# Patient Record
Sex: Male | Born: 1950
Health system: Southern US, Community
[De-identification: ages and names within clinical notes are randomized; demographics above are authoritative.]

## PROBLEM LIST (undated history)

## (undated) DIAGNOSIS — I340 Nonrheumatic mitral (valve) insufficiency: Secondary | ICD-10-CM

## (undated) DIAGNOSIS — I2510 Atherosclerotic heart disease of native coronary artery without angina pectoris: Secondary | ICD-10-CM

## (undated) DIAGNOSIS — E785 Hyperlipidemia, unspecified: Secondary | ICD-10-CM

## (undated) DIAGNOSIS — I351 Nonrheumatic aortic (valve) insufficiency: Secondary | ICD-10-CM

## (undated) DIAGNOSIS — Q600 Renal agenesis, unilateral: Secondary | ICD-10-CM

## (undated) DIAGNOSIS — I10 Essential (primary) hypertension: Secondary | ICD-10-CM

## (undated) HISTORY — DX: Nonrheumatic mitral (valve) insufficiency: I34.0

## (undated) HISTORY — DX: Nonrheumatic aortic (valve) insufficiency: I35.1

## (undated) HISTORY — DX: Essential (primary) hypertension: I10

## (undated) HISTORY — DX: Hyperlipidemia, unspecified: E78.5

---

## 2002-03-25 HISTORY — PX: ANTERIOR CRUCIATE LIGAMENT REPAIR: SHX115

## 2004-12-20 ENCOUNTER — Ambulatory Visit: Payer: Self-pay | Admitting: Cardiology

## 2006-01-14 ENCOUNTER — Ambulatory Visit: Payer: Self-pay | Admitting: Cardiology

## 2008-12-17 LAB — HM COLONOSCOPY: HM Colonoscopy: NORMAL

## 2009-05-11 DIAGNOSIS — E785 Hyperlipidemia, unspecified: Secondary | ICD-10-CM | POA: Insufficient documentation

## 2009-05-11 DIAGNOSIS — I10 Essential (primary) hypertension: Secondary | ICD-10-CM | POA: Insufficient documentation

## 2009-05-11 DIAGNOSIS — E782 Mixed hyperlipidemia: Secondary | ICD-10-CM | POA: Insufficient documentation

## 2009-05-16 ENCOUNTER — Ambulatory Visit: Payer: Self-pay | Admitting: Cardiology

## 2009-05-16 DIAGNOSIS — R9431 Abnormal electrocardiogram [ECG] [EKG]: Secondary | ICD-10-CM | POA: Insufficient documentation

## 2010-04-24 NOTE — Assessment & Plan Note (Signed)
Summary: f3y/chest pain/eval for stress test   Visit Type:  3 yr f/u Referring Provider:  Dr. Lin Givens Primary Provider:  Dr. Lin Givens  CC:  chest pressure at times...Marland KitchenMarland Kitchenbp elevated.  History of Present Illness: Mr Careaga comes in today for evaluation and management of an abnormal EKG.  I last evaluated him in 2007. I was attempting to treat his hypertension and hyperlipidemia though his compliance was not very good.  He saw Dr. Lin Givens the other day and was placed on lamina propria. He has not returned for his blood work or for followup. I have encouraged him to do so.  He does not restrict his sodium in his diet. He does not exercise on a regular basis. He does not smoke.  She also told me had an abnormal EKG. He always has had a superior rightward axis with very low voltage in aVL. His EKG today is unchanged since 1997. Currently is having no angina or ischemic symptoms. He denies dyspnea on exertion. He's had no palpitations or syncope.  Current Medications (verified): 1)  Ramipril 10 Mg Caps (Ramipril) .[name].. 1 Cap Once Daily  Allergies (verified): No Known Drug Allergies  Review of Systems       negative other than history of present illness  Vital Signs:  Patient profile:   60 year old male Height:      66 inches Weight:      176 pounds BMI:     28.51 Pulse rate:   77 / minute Pulse rhythm:   irregular BP sitting:   156 / 100  (left arm) Cuff size:   large  Vitals Entered By: Danielle Rankin, CMA (May 16, 2009 4:04 PM)  Physical Exam  General:  Well developed, well nourished, in no acute distress. Head:  normocephalic and atraumatic Eyes:  PERRLA/EOM intact; conjunctiva and lids normal. Neck:  Neck supple, no JVD. No masses, thyromegaly or abnormal cervical nodes. Chest Tram Wrenn:  no deformities or breast masses noted Lungs:  Clear bilaterally to auscultation and percussion. Heart:  nondisplaced PMI, normal S1-S2, S2 splits.no obvious murmur, no right ventricular  lift Msk:  Back normal, normal gait. Muscle strength and tone normal. Pulses:  pulses normal in all 4 extremities Extremities:  No clubbing or cyanosis. Neurologic:  Alert and oriented x 3. Skin:  Intact without lesions or rashes. Psych:  Normal affect.   Problems:  Medical Problems Added: 1)  Dx of Abnormal Ekg  (ICD-794.31)  Impression & Recommendations:  Problem # 1:  ABNORMAL EKG (ICD-794.31) Assessment Unchanged his That EKG is stable since 1997. He's having no symptoms of organic heart disease. His blood pressures needs to be well controlled. Approximate 2 or 3 different medications to control. I've encouraged him to go back to get his blood work and follow Dr. Lin Givens. I do not feel stress test is indicated at this point. His updated medication list for this problem includes:    Ramipril 10 Mg Caps (Ramipril) .[name]... 1 cap once daily  Problem # 2:  HYPERLIPIDEMIA-MIXED (ICD-272.4) Assessment: Unchanged  Problem # 3:  HYPERTENSION (ICD-401.9) Assessment: Deteriorated His grandmother is maxed out. He is due blood work to Dr. Lin Givens. I would recommend a consideration for a calcium channel blocker next. I doubt that a beta blocker would be effective and probably poorly tolerated. I did not add any medication today. He promises to followup with Dr. Lin Givens. His updated medication list for this problem includes:    Ramipril 10 Mg Caps (Ramipril) .[name]... 1 cap once  daily  Orders: EKG w/ Interpretation (93000)  Patient Instructions: 1)  Your physician recommends that you schedule a follow-up appointment in: YEARLY  WITH DR Letica Giaimo 2)  Your physician recommends that you continue on your current medications as directed. Please refer to the Current Medication list given to you today.

## 2010-06-15 ENCOUNTER — Telehealth: Payer: Self-pay | Admitting: Cardiology

## 2010-06-15 NOTE — Telephone Encounter (Signed)
LOV faxed to [facility] @ [phone] 06/15/10/KM

## 2010-08-10 NOTE — Assessment & Plan Note (Signed)
[facility] OFFICE NOTE   KENDRYCK, LACROIX                  MRN:          [id number]  DATE:01/14/2006                            DOB:          August 23, 1950    Mr. Begue returns today for further management of his hypertension and mixed  hyperlipidemia.   He has had a cold and a viral syndrome form his office.  He ran out of his  Archer Asa about a month ago.   He is having no symptoms of angina or ischemia.   His lipids last year showed a total cholesterol 195, triglycerides of 173,  HDL was down to 34.2, LDL 034.  I recommended Crestor 10 mg a day because of  his very positive family history, hypertension, male sex, and age.  He  decided to take CholestOff over-the-counter q. week and also red yeast rice  twice a day.   His blood pressure medicine is Mavik 2 mg a day.   His blood pressure today is 150/78 and it was checked about 20 minutes later  and was 152/80.  His pulse is 89 and irregular.  He is in sinus rhythm with  RSR prime in V1 and V2, which is increased since last visit.  HEENT:  Normocephalic, atraumatic.  PERRLA.  Extraocular muscles are intact.  Somewhat bearded.  Carotid upstrokes are equal bilaterally without bruits.  There is no JVD.  Thyroid is not enlarged.  Trachea is midline.  LUNGS:  Reveal inspiratory and expiratory wheezing and rhonchi.  There are  no rales.  There is no dullness to percussion.  HEART:  Regular rate and rhythm.  ABDOMEN:  Soft with good bowel sounds.  There is no midline bruit.  There is  no hepatomegaly.  EXTREMITIES:  No edema.  Pulses intact.   ASSESSMENT AND PLAN:  1. Hypertension under suboptimal control.  Some of this is compliance      issue.  Last year he was 126/82 when he was taking his Mavik.  2. Mixed hyperlipidemia.  He has preferred to take over-the-counter      medicines, which I have told him will not be that effective.  He still      wants  to stay on these.   PLAN:  1. Check comprehensive metabolic panel and fasting lipids.  2. Mavik renewed for a year.  3. Follow up with me an a year.      Thomas C. Daleen Squibb, MD, [facility]     TCW/MedQ  DD:  01/14/2006  DT:  01/15/2006  Job #:  [id number]

## 2010-09-06 ENCOUNTER — Encounter: Payer: Self-pay | Admitting: Cardiovascular Disease

## 2014-01-11 LAB — CBC AND DIFFERENTIAL
HEMOGLOBIN: 15.8 g/dL (ref 13.5–17.5)
PLATELETS: 249 10*3/uL (ref 150–399)

## 2014-01-11 LAB — PSA: PSA: 0.31

## 2014-01-11 LAB — LIPID PANEL
Cholesterol: 179 mg/dL (ref 0–200)
HDL: 37 mg/dL (ref 35–70)
LDL Cholesterol: 111 mg/dL
Triglycerides: 158 mg/dL (ref 40–160)

## 2014-06-12 LAB — CBC AND DIFFERENTIAL: Hemoglobin: 15.8 g/dL (ref 13.5–17.5)

## 2014-06-12 LAB — HEMOGLOBIN A1C: HEMOGLOBIN A1C: 5.5 % (ref 4.0–6.0)

## 2014-06-16 LAB — BASIC METABOLIC PANEL
BUN: 23 mg/dL — AB (ref 4–21)
CREATININE: 0.9 mg/dL (ref <=1.3)
Glucose: 107 mg/dL
SODIUM: 138 mmol/L (ref 137–147)

## 2014-06-16 LAB — CBC AND DIFFERENTIAL
Platelets: 249 10*3/uL (ref 150–399)
WBC: 7.6 10^3/mL

## 2014-06-16 LAB — PSA: PSA: 0.31

## 2014-06-22 DIAGNOSIS — Z7189 Other specified counseling: Secondary | ICD-10-CM | POA: Insufficient documentation

## 2014-06-22 DIAGNOSIS — Z7185 Encounter for immunization safety counseling: Secondary | ICD-10-CM | POA: Insufficient documentation

## 2014-12-14 ENCOUNTER — Encounter: Payer: Self-pay | Admitting: Internal Medicine

## 2014-12-14 ENCOUNTER — Ambulatory Visit (INDEPENDENT_AMBULATORY_CARE_PROVIDER_SITE_OTHER): Payer: BLUE CROSS/BLUE SHIELD | Admitting: Internal Medicine

## 2014-12-14 VITALS — BP 164/90 | HR 85 | Temp 98.5°F | Resp 14 | Ht 65.5 in | Wt 183.0 lb

## 2014-12-14 DIAGNOSIS — E785 Hyperlipidemia, unspecified: Secondary | ICD-10-CM

## 2014-12-14 DIAGNOSIS — M5090 Cervical disc disorder, unspecified, unspecified cervical region: Secondary | ICD-10-CM | POA: Insufficient documentation

## 2014-12-14 DIAGNOSIS — Z23 Encounter for immunization: Secondary | ICD-10-CM | POA: Diagnosis not present

## 2014-12-14 DIAGNOSIS — I10 Essential (primary) hypertension: Secondary | ICD-10-CM | POA: Diagnosis not present

## 2014-12-14 LAB — COMPREHENSIVE METABOLIC PANEL
ALT: 22 U/L (ref 0–53)
AST: 15 U/L (ref 0–37)
Albumin: 4.7 g/dL (ref 3.5–5.2)
Alkaline Phosphatase: 63 U/L (ref 39–117)
BUN: 19 mg/dL (ref 6–23)
CHLORIDE: 101 meq/L (ref 96–112)
CO2: 29 meq/L (ref 19–32)
Calcium: 9.6 mg/dL (ref 8.4–10.5)
Creatinine, Ser: 1.07 mg/dL (ref 0.40–1.50)
GFR: 74.28 mL/min (ref >60.00)
GLUCOSE: 101 mg/dL — AB (ref 70–99)
POTASSIUM: 4.3 meq/L (ref 3.5–5.1)
Sodium: 136 mEq/L (ref 135–145)
Total Bilirubin: 0.6 mg/dL (ref 0.2–1.2)
Total Protein: 7.8 g/dL (ref 6.0–8.3)

## 2014-12-14 LAB — MICROALBUMIN / CREATININE URINE RATIO
Creatinine,U: 117.7 mg/dL
MICROALB/CREAT RATIO: 7.5 mg/g (ref 0.0–30.0)
Microalb, Ur: 8.8 mg/dL — ABNORMAL HIGH (ref 0.0–1.9)

## 2014-12-14 MED ORDER — PREDNISONE 10 MG PO TABS: ORAL_TABLET | ORAL | Status: DC

## 2014-12-14 NOTE — Progress Notes (Signed)
Pre-visit discussion using our clinic review tool. No additional management support is needed unless otherwise documented below in the visit note.  

## 2014-12-14 NOTE — Patient Instructions (Addendum)
Increase your amlodipine to 10 mg immediately  I am checking your urine for protein today  plain films and MRI cervical spine ,  Likely will need referral to [facility]  Goal is 120/70,  Recheck in one week and call with results

## 2014-12-14 NOTE — Progress Notes (Signed)
Subjective:  Patient ID: Tami Ribas., male    DOB: March 27, 1950  Age: 64 y.o. MRN: [id number]  CC: The primary encounter diagnosis was Cervical disc disorder. Diagnoses of Essential hypertension, Need for prophylactic vaccination with combined diphtheria-tetanus-pertussis (DTP) vaccine, and Hyperlipidemia LDL goal <100 were also pertinent to this visit.  HPI Abron Neddo Hessie Diener. presents for new patient  encounter  1) hypertension : currently Uncontrolled.  He has been on the same regimen for several years,  started by [employer].  He was noted to be high 160/100 in October 2015 during follow up with prior PCP but declined medication change bc his home BPs were 120/70. Hr takes both  meds at night.  Good compliance. Uses a pill box.  .  Nouse of  Tobacco or   NSAIDs,  But drinks a lot of iced tea and water      Overweight:  Feels he has gained 10 lbs in the last month due to eating ice cream with mother Francesca Jewett.  Last  recorded weight was 176 lbs in march.  Diet is healthy  But has been indulging  TOO MUCH ICE CREAM AND COOKIES.  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   Neck pain  Prior chiropractor Laveda Abbe.  Hands have been tingling for the past 6 months .  Has noted loss of of strength noted in both hands.  Dropping things.    Stressful work [employer] copy [employer] [place] has a past medical history of Other and unspecified hyperlipidemia and HTN (hypertension).   He has past surgical history that includes Anterior cruciate ligament repair (Left, 2004).  Pain occurred after stepping in a hole. Ted Armour  .    His family history includes Hyperlipidemia in an other family member.He reports that he has never smoked. He has never used smokeless tobacco. He reports that he drinks alcohol. His drug history is not on file.  Outpatient Prescriptions Prior to Visit  Medication Sig  Dispense Refill  . ramipril (ALTACE) 10 MG capsule Take 10 mg by mouth daily.       No facility-administered medications prior to visit.    Review of Systems:  Patient denies headache, fevers, malaise, unintentional weight loss, skin rash, eye pain, sinus congestion and sinus pain, sore throat, dysphagia,  hemoptysis , cough, dyspnea, wheezing, chest pain, palpitations, orthopnea, edema, abdominal pain, nausea, melena, diarrhea, constipation, flank pain, dysuria, hematuria, urinary  Frequency, nocturia, numbness, tingling, seizures,  Focal weakness, Loss of consciousness,  Tremor, insomnia, depression, anxiety, and suicidal ideation.     Objective:  BP 164/90 mmHg  Pulse 85  Temp(Src) 98.5 F (36.9 C) (Oral)  Resp 14  Ht 5' 5.5" (1.664 m)  Wt 183 lb (83.008 kg)  BMI 29.98 kg/m2  SpO2 97%  Physical Exam:  General appearance: alert, cooperative and appears stated age Ears: normal TM's and external ear canals both ears Throat: lips, mucosa, and tongue normal; teeth and gums normal Neck: no adenopathy, no carotid bruit, supple, symmetrical, trachea midline and thyroid not enlarged, symmetric, no tenderness/mass/nodules Back: symmetric, no curvature. ROM normal. No CVA tenderness. Lungs: clear to auscultation bilaterally Heart: regular rate and rhythm, S1, S2 normal, no murmur, click, rub or gallop Abdomen: soft, non-tender; bowel sounds normal; no masses,  no organomegaly Pulses: 2+ and symmetric Skin: Skin color, texture, turgor normal. No rashes or lesions Lymph  nodes: Cervical, supraclavicular, and axillary nodes normal.   Assessment & Plan:   Problem List Items Addressed This Visit      Unprioritized   Hyperlipidemia LDL goal <100    No prior treatment per review of [facility] records.   No results found for: CHOL, HDL, LDLCALC, LDLDIRECT, TRIG, CHOLHDL       Relevant Medications   amLODipine (NORVASC) 5 MG tablet   Essential hypertension    Uncontrolled and  confirmed on my exam.  Will increase amlodipine to 10 mg today.   Will increase Altace if needed, given proteinuria  Lab Results  Component Value Date   CREATININE 1.07 12/14/2014   Lab Results  Component Value Date   NA 136 12/14/2014   K 4.3 12/14/2014   CL 101 12/14/2014   CO2 29 12/14/2014   Lab Results  Component Value Date   MICROALBUR 8.8* 12/14/2014         Relevant Medications   amLODipine (NORVASC) 5 MG tablet   Other Relevant Orders   Comp Met (CMET) (Completed)   Microalbumin / creatinine urine ratio (Completed)   Cervical disc disorder - Primary    Recommended avoiding neck manipulation by chiropractor .  MRI cervical spine advised given radiclopathy and decrease in strength       Relevant Orders   DG Cervical Spine Complete    Other Visit Diagnoses    Need for prophylactic vaccination with combined diphtheria-tetanus-pertussis (DTP) vaccine        Relevant Orders    Tdap vaccine greater than or equal to 7yo IM (Completed)       I am having Mr. Shirk start on predniSONE. I am also having him maintain his ramipril, amLODipine, and Red Yeast Rice Extract.  Meds ordered this encounter  Medications  . amLODipine (NORVASC) 5 MG tablet    Sig: TAKE ONE TABLET BY MOUTH EVERY DAY  . Red Yeast Rice Extract 600 MG CAPS    Sig: Take 1 capsule by mouth 2 (two) times daily.   . predniSONE (DELTASONE) 10 MG tablet    Sig: 6 tablets on Day 1 , then reduce by 1 tablet daily until gone    Dispense:  21 tablet    Refill:  0    There are no discontinued medications.  Follow-up: Return in about 4 weeks (around 01/11/2015).   Crecencio Mc, MD

## 2014-12-16 ENCOUNTER — Encounter: Payer: Self-pay | Admitting: *Deleted

## 2014-12-17 NOTE — Assessment & Plan Note (Addendum)
No prior treatment per review of [facility] records. 10 yr risk of CAD is 35% based on today's BP and recent panel,  Will recommend statin therapy, as Based on current lipid profile, as the [employer] recommends starting patients aged 62 or higher on moderate intensity statin therapy for LDL between 70-189 and 10 yr risk of CAD > 7.5% ;  and high intensity therapy for anyone with LDL > 190.  Lab Results  Component Value Date   CHOL 179 01/11/2014   HDL 37 01/11/2014   LDLCALC 111 01/11/2014   TRIG 158 01/11/2014

## 2014-12-17 NOTE — Assessment & Plan Note (Addendum)
Recommended avoiding neck manipulation by chiropractor .  MRI cervical spine advised given radiculopathy and decrease in bilateral arm strength

## 2014-12-17 NOTE — Assessment & Plan Note (Signed)
Uncontrolled and confirmed on my exam.  Will increase amlodipine to 10 mg today.   Will increase Altace if needed, given proteinuria  Lab Results  Component Value Date   CREATININE 1.07 12/14/2014   Lab Results  Component Value Date   NA 136 12/14/2014   K 4.3 12/14/2014   CL 101 12/14/2014   CO2 29 12/14/2014   Lab Results  Component Value Date   MICROALBUR 8.8* 12/14/2014

## 2014-12-18 ENCOUNTER — Encounter: Payer: Self-pay | Admitting: Internal Medicine

## 2014-12-27 ENCOUNTER — Other Ambulatory Visit: Payer: Self-pay

## 2014-12-27 MED ORDER — AMLODIPINE BESYLATE 10 MG PO TABS: 5.0000 mg | ORAL_TABLET | Freq: Every day | ORAL | Status: DC

## 2015-03-31 ENCOUNTER — Ambulatory Visit (INDEPENDENT_AMBULATORY_CARE_PROVIDER_SITE_OTHER): Payer: BLUE CROSS/BLUE SHIELD | Admitting: Internal Medicine

## 2015-03-31 ENCOUNTER — Encounter: Payer: Self-pay | Admitting: Internal Medicine

## 2015-03-31 VITALS — BP 164/92 | HR 107 | Temp 97.6°F | Resp 18 | Ht 65.5 in | Wt 183.4 lb

## 2015-03-31 DIAGNOSIS — I10 Essential (primary) hypertension: Secondary | ICD-10-CM | POA: Diagnosis not present

## 2015-03-31 DIAGNOSIS — B9789 Other viral agents as the cause of diseases classified elsewhere: Secondary | ICD-10-CM

## 2015-03-31 DIAGNOSIS — J069 Acute upper respiratory infection, unspecified: Secondary | ICD-10-CM

## 2015-03-31 DIAGNOSIS — E785 Hyperlipidemia, unspecified: Secondary | ICD-10-CM

## 2015-03-31 DIAGNOSIS — M5090 Cervical disc disorder, unspecified, unspecified cervical region: Secondary | ICD-10-CM

## 2015-03-31 DIAGNOSIS — H6123 Impacted cerumen, bilateral: Secondary | ICD-10-CM

## 2015-03-31 MED ORDER — LOSARTAN POTASSIUM 50 MG PO TABS: 50.0000 mg | ORAL_TABLET | Freq: Every day | ORAL | Status: DC

## 2015-03-31 NOTE — Progress Notes (Signed)
Pre-visit discussion using our clinic review tool. No additional management support is needed unless otherwise documented below in the visit note.  

## 2015-03-31 NOTE — Patient Instructions (Addendum)
Increase your ramipril to 20 mg daily  , the Max dose.  Goal is  BP < 140/80,  If not at goal after 2 weeks,  switch to losartan starting at 50 mg daily.  Increase to 100 mg after one week if BP is not < 140/80  Return in March for fasting labs   Your ears are 95% blocked !!  Consider getting an irrigation,  You can also try liquid colace or Debrox to soften up the wax.

## 2015-03-31 NOTE — Progress Notes (Signed)
Subjective:  Patient ID: Frederick HarborMarshall P Defranco Jr., male    DOB: 01/05/1951  Age: 65 y.o. MRN: [id number]  CC: The primary encounter diagnosis was Hyperlipidemia. Diagnoses of Essential hypertension, Hyperlipidemia LDL goal <100, Cerumen impaction, bilateral, Viral URI with cough, and Cervical disc disorder were also pertinent to this visit.  HPI Frederick BlankMarshall P Toney ReilKoury Jr. presents forfollow up on acute and chronic conditions:  1) transient dizziness with position change .  Lasts  only 3 sec's  Or less .  No true vertigo, headaches or vision changes.   2) recurrent bilateral ear wax impaction   3)  cough x 3 days, nonproductive . No fevers,  Some sinus drainage.   4) hypertension . Persistently elevated on 10 mg and ramipril 10 once daily  .  Not using NSAID s.    5) pain.  Multiple sources.  Cervical disk disease, Bilateral knee pain .  OA/DJD  .    Outpatient Prescriptions Prior to Visit  Medication Sig Dispense Refill  . amLODipine (NORVASC) 10 MG tablet Take 0.5 tablets (5 mg total) by mouth daily. 1 tablet 5  . ramipril (ALTACE) 10 MG capsule Take 10 mg by mouth daily.      . Red Yeast Rice Extract 600 MG CAPS Take 1 capsule by mouth 2 (two) times daily.     . predniSONE (DELTASONE) 10 MG tablet 6 tablets on Day 1 , then reduce by 1 tablet daily until gone (Patient not taking: Reported on 03/31/2015) 21 tablet 0   No facility-administered medications prior to visit.    Review of Systems;  Patient denies headache, fevers, malaise, unintentional weight loss, skin rash, eye pain, sinus congestion and sinus pain, sore throat, dysphagia,  hemoptysis , cough, dyspnea, wheezing, chest pain, palpitations, orthopnea, edema, abdominal pain, nausea, melena, diarrhea, constipation, flank pain, dysuria, hematuria, urinary  Frequency, nocturia, numbness, tingling, seizures,  Focal weakness, Loss of consciousness,  Tremor, insomnia, depression, anxiety, and suicidal ideation.      Objective:  BP 164/92  mmHg  Pulse 107  Temp(Src) 97.6 F (36.4 C) (Oral)  Resp 18  Ht 5' 5.5" (1.664 m)  Wt 183 lb 6 oz (83.178 kg)  BMI 30.04 kg/m2  SpO2 96%  BP Readings from Last 3 Encounters:  03/31/15 164/92  12/14/14 164/90  05/16/09 156/100    Wt Readings from Last 3 Encounters:  03/31/15 183 lb 6 oz (83.178 kg)  12/14/14 183 lb (83.008 kg)  05/16/09 176 lb (79.833 kg)    General appearance: alert, cooperative and appears stated age HEENT: Head: Normocephalic, without obvious abnormality, atraumatic, sinuses tender to percussion Eyes: conjunctivae/corneas clear. PERRL, EOM's intact. Fundi benign. Ears:  TM'snot visible due to bilateral impaction Nose: Nares normal. Septum midline. Mucosa injected and red . Maxillary  Sinuses nontender. no crusting or bleeding points Throat: lips, mucosa, and tongue normal; teeth and gums normal Back: symmetric, no curvature. ROM normal. No CVA tenderness. Lungs: clear to auscultation bilaterally Heart: regular rate and rhythm, S1, S2 normal, no murmur, click, rub or gallop Abdomen: soft, non-tender; bowel sounds normal; no masses,  no organomegaly Pulses: 2+ and symmetric Skin: Skin color, texture, turgor normal. No rashes or lesions Lymph nodes: Cervical, supraclavicular, and axillary nodes normal.  Lab Results  Component Value Date   HGBA1C 5.5 06/12/2014    Lab Results  Component Value Date   CREATININE 1.07 12/14/2014   CREATININE 0.9 06/16/2014    Lab Results  Component Value Date   WBC 7.6 06/16/2014  HGB 15.8 06/12/2014   PLT 249 06/16/2014   GLUCOSE 101* 12/14/2014   CHOL 179 01/11/2014   TRIG 158 01/11/2014   HDL 37 01/11/2014   LDLCALC 111 01/11/2014   ALT 22 12/14/2014   AST 15 12/14/2014   NA 136 12/14/2014   K 4.3 12/14/2014   CL 101 12/14/2014   CREATININE 1.07 12/14/2014   BUN 19 12/14/2014   CO2 29 12/14/2014   PSA 0.31 06/16/2014   HGBA1C 5.5 06/12/2014   MICROALBUR 8.8* 12/14/2014    No results  found.  Assessment & Plan:   Problem List Items Addressed This Visit    Hyperlipidemia LDL goal <100    No prior statin therapy .  Taking red yeast rice inconsistently.  Advised to return in March for fasting labs.       Relevant Medications   losartan (COZAAR) 50 MG tablet   Essential hypertension    not well controlled.  Increase ramipril to 20 mg daily , transition to losartan prior to next refill.   Advised to increase dose to 100 mg for sbp > 140/80      Relevant Medications   losartan (COZAAR) 50 MG tablet   Other Relevant Orders   Comprehensive metabolic panel   Cervical disc disorder    NSIADIs and pain relievers offered but deferred.       Cerumen impaction    Advised to use Debrox or liquid colace and return for irrigation.       Viral URI with cough    URI is most likely viral given the mild HEENT  Symptoms  And normal exam.   I have explained that in viral URIS, an antibiotic will not help the symptoms and will increase the risk of developing diarrhea.,  Continue oral and nasal decongestants, and tylenol 650 mq 8 hrs for aches and pains.        Other Visit Diagnoses    Hyperlipidemia    -  Primary    Relevant Medications    losartan (COZAAR) 50 MG tablet    Other Relevant Orders    Lipid panel      A total of 40 minutes of face to face time was spent with patient more than half of which was spent in counselling and coordination of care    I have discontinued Mr. Torosyan predniSONE. I am also having him start on losartan. Additionally, I am having him maintain his ramipril, Red Yeast Rice Extract, and amLODipine.  Meds ordered this encounter  Medications  . losartan (COZAAR) 50 MG tablet    Sig: Take 1 tablet (50 mg total) by mouth daily.    Dispense:  90 tablet    Refill:  1    Medications Discontinued During This Encounter  Medication Reason  . predniSONE (DELTASONE) 10 MG tablet     Follow-up: Return in about 2 months (around  05/29/2015).   Sherlene Shams, MD

## 2015-03-31 NOTE — Assessment & Plan Note (Addendum)
No prior statin therapy .  Taking red yeast rice inconsistently.  Advised to return in March for fasting labs.

## 2015-04-02 DIAGNOSIS — B9789 Other viral agents as the cause of diseases classified elsewhere: Secondary | ICD-10-CM

## 2015-04-02 DIAGNOSIS — H6120 Impacted cerumen, unspecified ear: Secondary | ICD-10-CM | POA: Insufficient documentation

## 2015-04-02 DIAGNOSIS — J069 Acute upper respiratory infection, unspecified: Secondary | ICD-10-CM | POA: Insufficient documentation

## 2015-04-02 NOTE — Assessment & Plan Note (Signed)
not well controlled.  Increase ramipril to 20 mg daily , transition to losartan prior to next refill.   Advised to increase dose to 100 mg for sbp > 140/80

## 2015-04-02 NOTE — Assessment & Plan Note (Signed)
NSIADIs and pain relievers offered but deferred.

## 2015-04-02 NOTE — Assessment & Plan Note (Signed)
Advised to use Debrox or liquid colace and return for irrigation.

## 2015-04-02 NOTE — Assessment & Plan Note (Signed)
URI is most likely viral given the mild HEENT  Symptoms  And normal exam.   I have explained that in viral URIS, an antibiotic will not help the symptoms and will increase the risk of developing diarrhea.,  Continue oral and nasal decongestants, and tylenol 650 mq 8 hrs for aches and pains  

## 2015-05-24 ENCOUNTER — Other Ambulatory Visit: Payer: BLUE CROSS/BLUE SHIELD

## 2015-05-29 ENCOUNTER — Ambulatory Visit (INDEPENDENT_AMBULATORY_CARE_PROVIDER_SITE_OTHER): Payer: BLUE CROSS/BLUE SHIELD | Admitting: Internal Medicine

## 2015-05-29 ENCOUNTER — Encounter: Payer: Self-pay | Admitting: Internal Medicine

## 2015-05-29 VITALS — BP 124/80 | HR 75 | Temp 97.5°F | Resp 12 | Ht 66.0 in | Wt 179.2 lb

## 2015-05-29 DIAGNOSIS — I10 Essential (primary) hypertension: Secondary | ICD-10-CM | POA: Diagnosis not present

## 2015-05-29 DIAGNOSIS — Z7289 Other problems related to lifestyle: Secondary | ICD-10-CM

## 2015-05-29 DIAGNOSIS — Z0000 Encounter for general adult medical examination without abnormal findings: Secondary | ICD-10-CM

## 2015-05-29 DIAGNOSIS — Z0001 Encounter for general adult medical examination with abnormal findings: Secondary | ICD-10-CM | POA: Diagnosis not present

## 2015-05-29 DIAGNOSIS — E785 Hyperlipidemia, unspecified: Secondary | ICD-10-CM | POA: Diagnosis not present

## 2015-05-29 DIAGNOSIS — R5383 Other fatigue: Secondary | ICD-10-CM

## 2015-05-29 DIAGNOSIS — Z125 Encounter for screening for malignant neoplasm of prostate: Secondary | ICD-10-CM

## 2015-05-29 DIAGNOSIS — B354 Tinea corporis: Secondary | ICD-10-CM

## 2015-05-29 LAB — COMPREHENSIVE METABOLIC PANEL
ALBUMIN: 4.9 g/dL (ref 3.5–5.2)
ALK PHOS: 73 U/L (ref 39–117)
ALT: 20 U/L (ref 0–53)
AST: 14 U/L (ref 0–37)
BUN: 21 mg/dL (ref 6–23)
CALCIUM: 9.8 mg/dL (ref 8.4–10.5)
CHLORIDE: 100 meq/L (ref 96–112)
CO2: 27 mEq/L (ref 19–32)
Creatinine, Ser: 0.92 mg/dL (ref 0.40–1.50)
GFR: 88.3 mL/min (ref >60.00)
Glucose, Bld: 100 mg/dL — ABNORMAL HIGH (ref 70–99)
POTASSIUM: 4.6 meq/L (ref 3.5–5.1)
Sodium: 137 mEq/L (ref 135–145)
TOTAL PROTEIN: 7.7 g/dL (ref 6.0–8.3)
Total Bilirubin: 0.8 mg/dL (ref 0.2–1.2)

## 2015-05-29 LAB — LIPID PANEL
CHOLESTEROL: 188 mg/dL (ref 0–200)
HDL: 42.7 mg/dL (ref >39.00)
LDL Cholesterol: 117 mg/dL — ABNORMAL HIGH (ref 0–99)
NonHDL: 145.45
TRIGLYCERIDES: 140 mg/dL (ref 0.0–149.0)
Total CHOL/HDL Ratio: 4
VLDL: 28 mg/dL (ref 0.0–40.0)

## 2015-05-29 LAB — TSH: TSH: 0.97 u[IU]/mL (ref 0.35–4.50)

## 2015-05-29 LAB — PSA: PSA: 0.39 ng/mL (ref 0.10–4.00)

## 2015-05-29 MED ORDER — LOSARTAN POTASSIUM 100 MG PO TABS: 100.0000 mg | ORAL_TABLET | Freq: Every day | ORAL | Status: DC

## 2015-05-29 MED ORDER — TERBINAFINE HCL 1 % EX CREA: 1.0000 "application " | TOPICAL_CREAM | Freq: Two times a day (BID) | CUTANEOUS | Status: DC

## 2015-05-29 NOTE — Patient Instructions (Addendum)
Your blood pressure is well controlled on the current regimen of losartan 100 mg and amlodipine 10 mg daily   We're checking cholesterol,  thyroid ,  Etc today and screening for you HIV and Hepatitis C (per CDC guidelines for all baby boomers)   [facility] check your PSA as well   Your next colon cancer screening is due in 2020 and we'll use the cologuard test as slong as you aren't having any problems   [place]!!!!  (smoother than [place])   Health Maintenance, Male A healthy lifestyle and preventative care can promote health and wellness.  Maintain regular health, dental, and eye exams.  Eat a healthy diet. Foods like vegetables, fruits, whole grains, low-fat dairy products, and lean protein foods contain the nutrients you need and are low in calories. Decrease your intake of foods high in solid fats, added sugars, and salt. Get information about a proper diet from your health care provider, if necessary.  Regular physical exercise is one of the most important things you can do for your health. Most adults should get at least 150 minutes of moderate-intensity exercise (any activity that increases your heart rate and causes you to sweat) each week. In addition, most adults need muscle-strengthening exercises on 2 or more days a week.   Maintain a healthy weight. The body mass index (BMI) is a screening tool to identify possible weight problems. It provides an estimate of body fat based on height and weight. Your health care provider can find your BMI and can help you achieve or maintain a healthy weight. For males 20 years and older:  A BMI below 18.5 is considered underweight.  A BMI of 18.5 to 24.9 is normal.  A BMI of 25 to 29.9 is considered overweight.  A BMI of 30 and above is considered obese.  Maintain normal blood lipids and cholesterol by exercising and minimizing your intake of saturated fat. Eat a balanced diet with plenty of fruits and vegetables. Blood tests for lipids  and cholesterol should begin at age 65 and be repeated every 5 years. If your lipid or cholesterol levels are high, you are over age 69, or you are at high risk for heart disease, you may need your cholesterol levels checked more frequently.Ongoing high lipid and cholesterol levels should be treated with medicines if diet and exercise are not working.  If you smoke, find out from your health care provider how to quit. If you do not use tobacco, do not start.  Lung cancer screening is recommended for adults aged 55-80 years who are at high risk for developing lung cancer because of a history of smoking. A yearly low-dose CT scan of the lungs is recommended for people who have at least a 30-pack-year history of smoking and are current smokers or have quit within the past 15 years. A pack year of smoking is smoking an average of 1 pack of cigarettes a day for 1 year (for example, a 30-pack-year history of smoking could mean smoking 1 pack a day for 30 years or 2 packs a day for 15 years). Yearly screening should continue until the smoker has stopped smoking for at least 15 years. Yearly screening should be stopped for people who develop a health problem that would prevent them from having lung cancer treatment.  If you choose to drink alcohol, do not have more than 2 drinks per day. One drink is considered to be 12 oz (360 mL) of beer, 5 oz (150 mL)  of wine, or 1.5 oz (45 mL) of liquor.  Avoid the use of street drugs. Do not share needles with anyone. Ask for help if you need support or instructions about stopping the use of drugs.  High blood pressure causes heart disease and increases the risk of stroke. High blood pressure is more likely to develop in:  People who have blood pressure in the end of the normal range (100-139/85-89 mm Hg).  People who are overweight or obese.  People who are African American.  If you are 8518-63 years of age, have your blood pressure checked every 3-5 years. If you are  63 years of age or older, have your blood pressure checked every year. You should have your blood pressure measured twice--once when you are at a hospital or clinic, and once when you are not at a hospital or clinic. Record the average of the two measurements. To check your blood pressure when you are not at a hospital or clinic, you can use:  An automated blood pressure machine at a pharmacy.  A home blood pressure monitor.  If you are 7245-63 years old, ask your health care provider if you should take aspirin to prevent heart disease.  Diabetes screening involves taking a blood sample to check your fasting blood sugar level. This should be done once every 3 years after age 63 if you are at a normal weight and without risk factors for diabetes. Testing should be considered at a younger age or be carried out more frequently if you are overweight and have at least 1 risk factor for diabetes.  Colorectal cancer can be detected and often prevented. Most routine colorectal cancer screening begins at the age of 63 and continues through age 63. However, your health care provider may recommend screening at an earlier age if you have risk factors for colon cancer. On a yearly basis, your health care provider may provide home test kits to check for hidden blood in the stool. A small camera at the end of a tube may be used to directly examine the colon (sigmoidoscopy or colonoscopy) to detect the earliest forms of colorectal cancer. Talk to your health care provider about this at age 63 when routine screening begins. A direct exam of the colon should be repeated every 5-10 years through age 63, unless early forms of precancerous polyps or small growths are found.  People who are at an increased risk for hepatitis B should be screened for this virus. You are considered at high risk for hepatitis B if:  You were born in a country where hepatitis B occurs often. Talk with your health care provider about which  countries are considered high risk.  Your parents were born in a high-risk country and you have not received a shot to protect against hepatitis B (hepatitis B vaccine).  You have HIV or AIDS.  You use needles to inject street drugs.  You live with, or have sex with, someone who has hepatitis B.  You are a man who has sex with other men (MSM).  You get hemodialysis treatment.  You take certain medicines for conditions like cancer, organ transplantation, and autoimmune conditions.  Hepatitis C blood testing is recommended for all people born from [id number] through 1965 and any individual with known risk factors for hepatitis C.  Healthy men should no longer receive prostate-specific antigen (PSA) blood tests as part of routine cancer screening. Talk to your health care provider about prostate cancer screening.  Testicular cancer  screening is not recommended for adolescents or adult males who have no symptoms. Screening includes self-exam, a health care provider exam, and other screening tests. Consult with your health care provider about any symptoms you have or any concerns you have about testicular cancer.  Practice safe sex. Use condoms and avoid high-risk sexual practices to reduce the spread of sexually transmitted infections (STIs).  You should be screened for STIs, including gonorrhea and chlamydia if:  You are sexually active and are younger than 24 years.  You are older than 24 years, and your health care provider tells you that you are at risk for this type of infection.  Your sexual activity has changed since you were last screened, and you are at an increased risk for chlamydia or gonorrhea. Ask your health care provider if you are at risk.  If you are at risk of being infected with HIV, it is recommended that you take a prescription medicine daily to prevent HIV infection. This is called pre-exposure prophylaxis (PrEP). You are considered at risk if:  You are a man who has  sex with other men (MSM).  You are a heterosexual man who is sexually active with multiple partners.  You take drugs by injection.  You are sexually active with a partner who has HIV.  Talk with your health care provider about whether you are at high risk of being infected with HIV. If you choose to begin PrEP, you should first be tested for HIV. You should then be tested every 3 months for as long as you are taking PrEP.  Use sunscreen. Apply sunscreen liberally and repeatedly throughout the day. You should seek shade when your shadow is shorter than you. Protect yourself by wearing long sleeves, pants, a wide-brimmed hat, and sunglasses year round whenever you are outdoors.  Tell your health care provider of new moles or changes in moles, especially if there is a change in shape or color. Also, tell your health care provider if a mole is larger than the size of a pencil eraser.  A one-time screening for abdominal aortic aneurysm (AAA) and surgical repair of large AAAs by ultrasound is recommended for men aged 65-75 years who are current or former smokers.  Stay current with your vaccines (immunizations).   This information is not intended to replace advice given to you by your health care provider. Make sure you discuss any questions you have with your health care provider.   Document Released: 09/07/2007 Document Revised: 04/01/2014 Document Reviewed: 08/06/2010 Elsevier Interactive Patient Education [employer]

## 2015-05-29 NOTE — Progress Notes (Signed)
Pre-visit discussion using our clinic review tool. No additional management support is needed unless otherwise documented below in the visit note.  

## 2015-05-30 DIAGNOSIS — Z0000 Encounter for general adult medical examination without abnormal findings: Secondary | ICD-10-CM | POA: Insufficient documentation

## 2015-05-30 LAB — HIV ANTIBODY (ROUTINE TESTING W REFLEX): HIV: NONREACTIVE

## 2015-05-30 LAB — HEPATITIS C ANTIBODY: HCV Ab: NEGATIVE

## 2015-05-30 NOTE — Assessment & Plan Note (Addendum)
Well controlled on current regimen of losartan 100 mg daily.  Renal function stable, no changes today.  Lab Results  Component Value Date   CREATININE 0.92 05/29/2015   CREATININE 1.07 12/14/2014   CREATININE 0.9 06/16/2014   Lab Results  Component Value Date   NA 137 05/29/2015   K 4.6 05/29/2015   CL 100 05/29/2015   CO2 27 05/29/2015

## 2015-05-30 NOTE — Assessment & Plan Note (Signed)
No prior statin therapy .  Taking red yeast rice twice daily.  ased on current lipid profile, the risk of clinically significant Coronary artery disease is 21%  over the next 10 years, using the Framingham risk calculator, Will recommend change from RYR to statin .  Lab Results  Component Value Date   CHOL 188 05/29/2015   HDL 42.70 05/29/2015   LDLCALC 117* 05/29/2015   TRIG 140.0 05/29/2015   CHOLHDL 4 05/29/2015

## 2015-05-30 NOTE — Progress Notes (Signed)
Patient ID: Frederick HarborMarshall P Minkin Jr., male    DOB: 11/25/1950  Age: 65 y.o. MRN: [id number]  The patient is here for annualwellness examination and management of other chronic and acute problems.   The risk factors are reflected in the social Pace.  The roster of all physicians providing medical care to patient - is listed in the Snapshot section of the chart.  Activities of daily living:  The patient is 100% independent in all ADLs: dressing, toileting, feeding as well as independent mobility  Home safety : The patient has smoke detectors in the home. They wear seatbelts.  There are no firearms at home. There is no violence in the home.   There is no risks for hepatitis, STDs or HIV. There is no   Pace of blood transfusion. They have no travel Pace to infectious disease endemic areas of the world.  The patient has seen their dentist in the last six month. They have seen their eye doctor in the last year. They admit to slight hearing difficulty with regard to whispered voices and some television programs.  They have deferred audiologic testing in the last year.  They do not  have excessive sun exposure. Discussed the need for sun protection: hats, long sleeves and use of sunscreen if there is significant sun exposure.   Diet: the importance of a healthy diet is discussed. They do have a healthy diet.  The benefits of regular aerobic exercise were discussed. He does not exercise regularly .   Depression screen: there are no signs or vegative symptoms of depression- irritability, change in appetite, anhedonia, sadness/tearfullness.  Cognitive assessment: the patient manages all their financial and personal affairs and is actively engaged. They could relate day,date,year and events; recalled 2/3 objects at 3 minutes; performed clock-face test normally.  The following portions of the patient's Pace were reviewed and updated as appropriate: allergies, current medications, past family Pace,  past medical Pace,  past surgical Pace, past social Pace  and problem list.  Visual acuity was not assessed per patient preference since she has regular follow up with her ophthalmologist. Hearing and body mass index were assessed and reviewed.   During the course of the visit the patient was educated and counseled about appropriate screening and preventive services including : fall prevention , diabetes screening, nutrition counseling, colorectal cancer screening, and recommended immunizations.    CC: The primary encounter diagnosis was Other problems related to lifestyle. Diagnoses of Other fatigue, Ringworm of body, Screening for prostate cancer, Essential hypertension, Hyperlipidemia, Visit for preventive health examination, and Hyperlipidemia LDL goal <100 were also pertinent to this visit.  presents for follow up on hypertension: Losratan started at 50 mg daily in January ,  Patient increased  it to 100 mg recently for not at goal.   Ringworm patch just above  achilles  Present for several weeks  Fasting today  Discussed cologuard for 2020 screening   Still experiencing some caregiver fatigue related to care of  mother , but now has someone coming out at night to clean her up before bed  Has lost 4 lbs unintentionally   Pace Frederick Pace of Other and unspecified hyperlipidemia and HTN (hypertension).   He has past surgical Pace that includes Anterior cruciate ligament repair (Left, 2004).   His family Pace is not on file.He reports that he has never smoked. He has never used smokeless tobacco. He reports that he drinks alcohol. His drug Pace is not on file.  Outpatient Prescriptions Prior to Visit  Medication Sig Dispense Refill  . amLODipine (NORVASC) 10 MG tablet Take 0.5 tablets (5 mg total) by mouth daily. 1 tablet 5  . Red Yeast Rice Extract 600 MG CAPS Take 1 capsule by mouth 2 (two) times daily.     [name] losartan (COZAAR) 50 MG  tablet Take 1 tablet (50 mg total) by mouth daily. 90 tablet 1  . ramipril (ALTACE) 10 MG capsule Take 10 mg by mouth daily. Reported on 05/29/2015     No facility-administered medications prior to visit.    Review of Systems   Patient denies headache, fevers, malaise, unintentional weight loss, skin rash, eye pain, sinus congestion and sinus pain, sore throat, dysphagia,  hemoptysis , cough, dyspnea, wheezing, chest pain, palpitations, orthopnea, edema, abdominal pain, nausea, melena, diarrhea, constipation, flank pain, dysuria, hematuria, urinary  Frequency, nocturia, numbness, tingling, seizures,  Focal weakness, Loss of consciousness,  Tremor, insomnia, depression, anxiety, and suicidal ideation.      Objective:  BP 124/80 mmHg  Pulse 75  Temp(Src) 97.5 F (36.4 C) (Oral)  Resp 12  Ht  (1.676 m)  Wt 179 lb 4 oz (81.307 kg)  BMI 28.95 kg/m2  SpO2 98%  Physical Exam   General appearance: alert, cooperative and appears stated age Ears: normal TM's and external ear canals both ears Throat: lips, mucosa, and tongue normal; teeth and gums normal Neck: no adenopathy, no carotid bruit, supple, symmetrical, trachea midline and thyroid not enlarged, symmetric, no tenderness/mass/nodules Back: symmetric, no curvature. ROM normal. No CVA tenderness. Lungs: clear to auscultation bilaterally Heart: regular rate and rhythm, S1, S2 normal, no murmur, click, rub or gallop Abdomen: soft, non-tender; bowel sounds normal; no masses,  no organomegaly Pulses: 2+ and symmetric Skin: Skin color, texture, turgor normal. No rashes or lesions Lymph nodes: Cervical, supraclavicular, and axillary nodes normal.    Assessment & Plan:   Problem List Items Addressed This Visit    Hyperlipidemia LDL goal <100    No prior statin therapy .  Taking red yeast rice twice daily.  ased on current lipid profile, the risk of clinically significant Coronary artery disease is 21%  over the next 10 years, using  the Framingham risk calculator, Will recommend change from RYR to statin .  Lab Results  Component Value Date   CHOL 188 05/29/2015   HDL 42.70 05/29/2015   LDLCALC 117* 05/29/2015   TRIG 140.0 05/29/2015   CHOLHDL 4 05/29/2015         Relevant Medications   losartan (COZAAR) 100 MG tablet   Essential hypertension    Well controlled on current regimen of losartan 100 mg daily.  Renal function stable, no changes today.  Lab Results  Component Value Date   CREATININE 0.92 05/29/2015   CREATININE 1.07 12/14/2014   CREATININE 0.9 06/16/2014   Lab Results  Component Value Date   NA 137 05/29/2015   K 4.6 05/29/2015   CL 100 05/29/2015   CO2 27 05/29/2015         Relevant Medications   losartan (COZAAR) 100 MG tablet   Visit for preventive health examination    Annual comprehensive preventive exam was done as well as an evaluation and management of acute and chronic conditions .  During the course of the visit the patient was educated and counseled about appropriate screening and preventive services including :  diabetes screening, lipid analysis with projected  10 year  risk for CAD , nutrition counseling, prostate  and colorectal cancer screening, and recommended immunizations.  Printed recommendations for health maintenance screenings was given.        Other Visit Diagnoses    Other problems related to lifestyle    -  Primary    Relevant Orders    Hepatitis C antibody (Completed)    HIV antibody (Completed)    Other fatigue        Relevant Orders    TSH (Completed)    Ringworm of body        Relevant Medications    terbinafine (LAMISIL) 1 % cream    Screening for prostate cancer        Relevant Orders    PSA (Completed)    Hyperlipidemia        Relevant Medications    losartan (COZAAR) 100 MG tablet       I have discontinued Mr. Degroff ramipril. I have also changed his losartan. Additionally, I am having him start on terbinafine. Lastly, I am having him  maintain his Red Yeast Rice Extract and amLODipine.  Meds ordered this encounter  Medications  . terbinafine (LAMISIL) 1 % cream    Sig: Apply 1 application topically 2 (two) times daily.    Dispense:  30 g    Refill:  0  . losartan (COZAAR) 100 MG tablet    Sig: Take 1 tablet (100 mg total) by mouth daily.    Dispense:  90 tablet    Refill:  1    Medications Discontinued During This Encounter  Medication Reason  . ramipril (ALTACE) 10 MG capsule Change in therapy  . losartan (COZAAR) 50 MG tablet Reorder    Follow-up: Return in about 6 months (around 11/29/2015).   Sherlene Shams, MD

## 2015-05-30 NOTE — Assessment & Plan Note (Signed)

## 2015-07-03 ENCOUNTER — Other Ambulatory Visit: Payer: Self-pay | Admitting: Internal Medicine

## 2015-09-05 ENCOUNTER — Other Ambulatory Visit: Payer: Self-pay | Admitting: Internal Medicine

## 2015-11-02 ENCOUNTER — Encounter: Payer: Self-pay | Admitting: Family Medicine

## 2015-11-02 ENCOUNTER — Ambulatory Visit (INDEPENDENT_AMBULATORY_CARE_PROVIDER_SITE_OTHER): Payer: BLUE CROSS/BLUE SHIELD | Admitting: Family Medicine

## 2015-11-02 ENCOUNTER — Other Ambulatory Visit: Payer: Self-pay | Admitting: Internal Medicine

## 2015-11-02 VITALS — BP 156/94 | HR 79 | Temp 98.0°F | Wt 182.2 lb

## 2015-11-02 DIAGNOSIS — T148 Other injury of unspecified body region: Secondary | ICD-10-CM

## 2015-11-02 DIAGNOSIS — M2550 Pain in unspecified joint: Secondary | ICD-10-CM | POA: Diagnosis not present

## 2015-11-02 DIAGNOSIS — W57XXXA Bitten or stung by nonvenomous insect and other nonvenomous arthropods, initial encounter: Secondary | ICD-10-CM | POA: Insufficient documentation

## 2015-11-02 DIAGNOSIS — M791 Myalgia, unspecified site: Secondary | ICD-10-CM | POA: Insufficient documentation

## 2015-11-02 NOTE — Patient Instructions (Signed)
Continue your current medications.  We will call with your results.  Take care  Dr. Adriana Simasook

## 2015-11-02 NOTE — Assessment & Plan Note (Signed)
New problem. Patient reporting diffuse myalgias and arthralgias. He is concerned about Lyme disease. I discussed the low likelihood of this. Patient would like testing. We'll proceed with that today.

## 2015-11-02 NOTE — Progress Notes (Signed)
Pre visit review using our clinic review tool, if applicable. No additional management support is needed unless otherwise documented below in the visit note. 

## 2015-11-02 NOTE — Progress Notes (Signed)
Subjective:  Patient ID: Frederick Harbor., male    DOB: December 03, 1950  Age: 65 y.o. MRN: [id number]  CC: Tick bites, testing for Lyme  HPI:  65 year old male with hypertension, hyperlipidemia presents with the above complaints.  Patient states that he's had for tick bites over the past year. Over the past several months she's been experiencing arthralgias and myalgias. He reports muscle tightness particularly in the low back. He is concerned about the possibility of Lyme disease as he's read quite a bit about this online. He has had recent tick bites. No recent fevers or chills. He reports that he's had rash as well but it does not appear like a target lesion. No medications or interventions tried regarding his symptoms (other than the rash that was treated by his primary care physician). No other complaints at this time. He would like testing for Lyme disease.  Social Hx   Social History   Social History  . Marital status: Married    Spouse name: N/A  . Number of children: N/A  . Years of education: N/A   Social History Main Topics  . Smoking status: Never Smoker  . Smokeless tobacco: Never Used  . Alcohol use 0.0 oz/week     Comment: occasionally  . Drug use: Unknown     Comment: Refused to answer  . Sexual activity: Not Asked   Other Topics Concern  . None   Social History Narrative  . None   Review of Systems  Constitutional: Positive for fatigue.  Musculoskeletal: Positive for arthralgias and myalgias.  Skin: Positive for rash.   Objective:  BP (!) 156/94 (BP Location: Right Arm, Patient Position: Sitting, Cuff Size: Normal)   Pulse 79   Temp 98 F (36.7 C) (Oral)   Wt 182 lb 4 oz (82.7 kg)   SpO2 96%   BMI 29.42 kg/m   BP/Weight 11/02/2015 05/29/2015 03/31/2015  Systolic BP 156 124 164  Diastolic BP 94 80 92  Wt. (Lbs) 182.25 179.25 183.38  BMI 29.42 28.95 30.04   Physical Exam  Constitutional: He is oriented to person, place, and time. He appears  well-developed. No distress.  Cardiovascular: Normal rate and regular rhythm.   Pulmonary/Chest: Effort normal. He has no wheezes. He has no rales.  Neurological: He is alert and oriented to person, place, and time.  Psychiatric: He has a normal mood and affect.  Vitals reviewed.  Lab Results  Component Value Date   WBC 7.6 06/16/2014   HGB 15.8 06/12/2014   PLT 249 06/16/2014   GLUCOSE 100 (H) 05/29/2015   CHOL 188 05/29/2015   TRIG 140.0 05/29/2015   HDL 42.70 05/29/2015   LDLCALC 117 (H) 05/29/2015   ALT 20 05/29/2015   AST 14 05/29/2015   NA 137 05/29/2015   K 4.6 05/29/2015   CL 100 05/29/2015   CREATININE 0.92 05/29/2015   BUN 21 05/29/2015   CO2 27 05/29/2015   TSH 0.97 05/29/2015   PSA 0.39 05/29/2015   HGBA1C 5.5 06/12/2014   MICROALBUR 8.8 (H) 12/14/2014    Assessment & Plan:   Problem List Items Addressed This Visit    Arthralgia    New problem. Proceeding with Lyme testing today.      Myalgia    New problem. Patient reporting diffuse myalgias and arthralgias. He is concerned about Lyme disease. I discussed the low likelihood of this. Patient would like testing. We'll proceed with that today.      Tick bites - Primary  Patient with prior history of tick bites. He is concerned about Lyme disease given his other reported symptoms. Lyme titers today.      Relevant Orders   Lyme Aby, Western Blot IgG & IgM w/bands    Other Visit Diagnoses   None.    Follow-up: PRN  Everlene OtherJayce Lakechia Nay DO [facility] [place]

## 2015-11-02 NOTE — Assessment & Plan Note (Signed)
New problem. Proceeding with Lyme testing today.

## 2015-11-02 NOTE — Assessment & Plan Note (Signed)
Patient with prior history of tick bites. He is concerned about Lyme disease given his other reported symptoms. Lyme titers today.

## 2015-11-09 ENCOUNTER — Telehealth: Payer: Self-pay | Admitting: Internal Medicine

## 2015-11-09 NOTE — Telephone Encounter (Signed)
Patient was notified that results have not been completed yet.

## 2015-11-09 NOTE — Telephone Encounter (Signed)
Pt called to get his lime test results from 11/02/15. Please advise?   Call pt @ [phone]. Thank you!

## 2015-11-10 LAB — LYME ABY, WSTRN BLT IGG & IGM W/BANDS
B BURGDORFERI IGG ABS (IB): NEGATIVE
B BURGDORFERI IGM ABS (IB): NEGATIVE
LYME DISEASE 18 KD IGG: NONREACTIVE
LYME DISEASE 28 KD IGG: NONREACTIVE
LYME DISEASE 30 KD IGG: NONREACTIVE
LYME DISEASE 39 KD IGG: NONREACTIVE
LYME DISEASE 58 KD IGG: NONREACTIVE
LYME DISEASE 93 KD IGG: NONREACTIVE
Lyme Disease 23 kD IgG: NONREACTIVE
Lyme Disease 23 kD IgM: NONREACTIVE
Lyme Disease 39 kD IgM: NONREACTIVE
Lyme Disease 41 kD IgG: REACTIVE — AB
Lyme Disease 41 kD IgM: NONREACTIVE
Lyme Disease 45 kD IgG: NONREACTIVE
Lyme Disease 66 kD IgG: NONREACTIVE

## 2015-11-21 ENCOUNTER — Other Ambulatory Visit: Payer: Self-pay | Admitting: Internal Medicine

## 2015-11-21 DIAGNOSIS — I10 Essential (primary) hypertension: Secondary | ICD-10-CM

## 2015-11-21 DIAGNOSIS — A6923 Arthritis due to Lyme disease: Secondary | ICD-10-CM

## 2015-11-21 NOTE — Assessment & Plan Note (Signed)
Patient needs  Repeat BP check today

## 2015-11-22 ENCOUNTER — Telehealth: Payer: Self-pay | Admitting: *Deleted

## 2015-11-22 DIAGNOSIS — A6920 Lyme disease, unspecified: Secondary | ICD-10-CM

## 2015-11-22 NOTE — Telephone Encounter (Signed)
Patient was diagnosed with lime disease, he requested a referral to infectious disease. Pt contact [phone]

## 2015-11-22 NOTE — Telephone Encounter (Signed)
WE DISCUSSED THIS YESTERDAY AND A RHEUMATOLOGY REFERRAL WAS MADE.  I have added  INFECTIOUS DISEASE consult as well

## 2015-11-22 NOTE — Telephone Encounter (Signed)
Please advise, thanks.

## 2015-11-22 NOTE — Telephone Encounter (Signed)
Noted, thanks!

## 2015-11-23 ENCOUNTER — Telehealth: Payer: Self-pay | Admitting: Internal Medicine

## 2015-11-23 ENCOUNTER — Telehealth: Payer: Self-pay | Admitting: Infectious Disease

## 2015-11-23 NOTE — Telephone Encounter (Signed)
Please let patient know that Dr Daiva EvesVan Dam reviewed his recent tests and does not agree that he has Lyme disease.  He has to have 5 of 8 bands positive to be considered a positive test .  He therefore, does not need to see ID.  I will continue the rheumatology since he is having joint pain

## 2015-11-23 NOTE — Telephone Encounter (Signed)
-----   Message from Randall Hissornelius N Van Dam, MD sent at 11/23/2015  4:54 PM EDT ----- Dr. 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 His igm is negative. He does not have lyme. Might be good idea to ask lab to correct their verbiag around this result as it is 100% garbage and going to lead to more unnecesary work antibiotics etc

## 2015-11-23 NOTE — Telephone Encounter (Signed)
   I received a note about referral for Lyme (borrellia infection)  I reviewed the labs and the patient having ONLY ONE out of EIGHT Bands POSITIVE for Borrellia on a Western blot is ACTUALLY A NEGATIVE RESULT IE HE DOES NOT HAVE SEROLOGICAL EVIDENCE FOR LYME AT ALL   THE CRITERIA FOR POSITIVE RESULT IS AT LEAST FIVE OUT OF EIGHT BANDS ON IGG TEST WHICH HE DOES NOT HAVE  The CDC does NOT recommend IgM testing for chronic symptoms as there is high false + on this test. Regardless he simlarly does not meet criteria for + IgM EITHER as he has 0/3 bands positive. Criteria is 2/3  The lab appears to have MESSED up their verbiage around the test results it should read patient needs to have at least 5 of 8 positive. Any of 8 positive is utter nonsense  This patient does NOT have infection with Borrelia and does NOT need an ID referral

## 2015-11-24 NOTE — Telephone Encounter (Signed)
Patient notified and voiced understanding.

## 2016-03-12 ENCOUNTER — Other Ambulatory Visit: Payer: Self-pay | Admitting: Internal Medicine

## 2016-07-16 ENCOUNTER — Other Ambulatory Visit: Payer: Self-pay | Admitting: Internal Medicine

## 2016-09-13 ENCOUNTER — Encounter: Payer: Self-pay | Admitting: Internal Medicine

## 2016-09-13 ENCOUNTER — Ambulatory Visit (INDEPENDENT_AMBULATORY_CARE_PROVIDER_SITE_OTHER): Payer: BLUE CROSS/BLUE SHIELD | Admitting: Internal Medicine

## 2016-09-13 VITALS — BP 152/96 | HR 72 | Temp 98.3°F | Resp 16 | Ht 66.0 in | Wt 186.0 lb

## 2016-09-13 DIAGNOSIS — E785 Hyperlipidemia, unspecified: Secondary | ICD-10-CM

## 2016-09-13 DIAGNOSIS — Z125 Encounter for screening for malignant neoplasm of prostate: Secondary | ICD-10-CM | POA: Diagnosis not present

## 2016-09-13 DIAGNOSIS — E7800 Pure hypercholesterolemia, unspecified: Secondary | ICD-10-CM | POA: Diagnosis not present

## 2016-09-13 DIAGNOSIS — R944 Abnormal results of kidney function studies: Secondary | ICD-10-CM

## 2016-09-13 DIAGNOSIS — I10 Essential (primary) hypertension: Secondary | ICD-10-CM | POA: Diagnosis not present

## 2016-09-13 DIAGNOSIS — Z0000 Encounter for general adult medical examination without abnormal findings: Secondary | ICD-10-CM

## 2016-09-13 LAB — LIPID PANEL
CHOL/HDL RATIO: 5.3 ratio — AB (ref <5.0)
CHOLESTEROL: 179 mg/dL (ref <200)
HDL: 34 mg/dL — AB (ref >40)
LDL Cholesterol: 89 mg/dL (ref <100)
TRIGLYCERIDES: 281 mg/dL — AB (ref <150)
VLDL: 56 mg/dL — ABNORMAL HIGH (ref <30)

## 2016-09-13 LAB — COMPREHENSIVE METABOLIC PANEL
ALT: 21 U/L (ref 9–46)
AST: 14 U/L (ref 10–35)
Albumin: 4.6 g/dL (ref 3.6–5.1)
Alkaline Phosphatase: 74 U/L (ref 40–115)
BUN: 15 mg/dL (ref 7–25)
CALCIUM: 9.2 mg/dL (ref 8.6–10.3)
CHLORIDE: 104 mmol/L (ref 98–110)
CO2: 25 mmol/L (ref 20–31)
Creat: 1.3 mg/dL — ABNORMAL HIGH (ref 0.70–1.25)
Glucose, Bld: 92 mg/dL (ref 65–99)
POTASSIUM: 4.2 mmol/L (ref 3.5–5.3)
Sodium: 139 mmol/L (ref 135–146)
TOTAL PROTEIN: 7.2 g/dL (ref 6.1–8.1)
Total Bilirubin: 0.4 mg/dL (ref 0.2–1.2)

## 2016-09-13 LAB — LDL CHOLESTEROL, DIRECT: LDL DIRECT: 119 mg/dL — AB (ref <100)

## 2016-09-13 MED ORDER — DOXYCYCLINE HYCLATE 100 MG PO TABS: 100.0000 mg | ORAL_TABLET | Freq: Two times a day (BID) | ORAL | 0 refills | Status: DC

## 2016-09-13 NOTE — Patient Instructions (Addendum)
Your blood pressure is too high.  Please get it checked at home a few times so we can decide if  We  need to add a third medication to your amlodipine and losartan to get you under < 130/80   I will order the cardiac CT to asses your risk for heart attack

## 2016-09-13 NOTE — Progress Notes (Signed)
Subjective:  Patient ID: Frederick Pace., male    DOB: 1950-04-08  Age: 66 y.o. MRN: [id number]  CC: The primary encounter diagnosis was Pure hypercholesterolemia. Diagnoses of Essential hypertension, Prostate cancer screening, Visit for preventive health examination, and Hyperlipidemia LDL goal <100 were also pertinent to this visit.  HPI Frederick Pace for his annual preventive exam and FOLLOW UP ON HYPERTENSION AND HYPERLIPIDEMIA. Patient was last seen March 2017 for his CPE .  Has been taking his medications as directed.  Does not check his blood pressure despite having a home monitor that is used to check his mother's by her caregiver. No complaints today.  Dos not exercise.  Has gained 7 lbs since last visit.      LAST MEAL 11:30    Health Maintenance reviewed: Normal colonoscopy 2010, Frederick Pace Screened for Hepatitis C and HIV last year    Outpatient Medications Prior to Visit  Medication Sig Dispense Refill  . amLODipine (NORVASC) 10 MG tablet TAKE 1 TABLET BY MOUTH DAILY 30 tablet 1  . losartan (COZAAR) 100 MG tablet TAKE ONE TABLET EVERY DAY 90 tablet 3  . Red Yeast Rice Extract 600 MG CAPS Take 1 capsule by mouth 2 (two) times daily.     [name] terbinafine (LAMISIL) 1 % cream Apply 1 application topically 2 (two) times daily. (Patient not taking: Reported on 11/02/2015) 30 g 0   No facility-administered medications prior to visit.     Review of Systems;  Patient denies headache, fevers, malaise, unintentional weight loss, skin rash, eye pain, sinus congestion and sinus pain, sore throat, dysphagia,  hemoptysis , cough, dyspnea, wheezing, chest pain, palpitations, orthopnea, edema, abdominal pain, nausea, melena, diarrhea, constipation, flank pain, dysuria, hematuria, urinary  Frequency, nocturia, numbness, tingling, seizures,  Focal weakness, Loss of consciousness,  Tremor, insomnia, depression, anxiety, and suicidal ideation.      Objective:  BP (!) 152/96    Pulse 72   Temp 98.3 F (36.8 C) (Oral)   Resp 16   Ht 5\' 6"  (1.676 m)   Wt 186 lb (84.4 kg)   SpO2 98%   BMI 30.02 kg/m   BP Readings from Last 3 Encounters:  09/13/16 (!) 152/96  11/02/15 (!) 156/94  05/29/15 124/80    Wt Readings from Last 3 Encounters:  09/13/16 186 lb (84.4 kg)  11/02/15 182 lb 4 oz (82.7 kg)  05/29/15 179 lb 4 oz (81.3 kg)    General appearance: alert, cooperative and appears stated age Ears: normal TM's and external ear canals both ears Throat: lips, mucosa, and tongue normal; teeth and gums normal Neck: no adenopathy, no carotid bruit, supple, symmetrical, trachea midline and thyroid not enlarged, symmetric, no tenderness/mass/nodules Back: symmetric, no curvature. ROM normal. No CVA tenderness. Lungs: clear to auscultation bilaterally Heart: regular rate and rhythm, S1, S2 normal, no murmur, click, rub or gallop Abdomen: soft, non-tender; bowel sounds normal; no masses,  no organomegaly Pulses: 2+ and symmetric Skin: Skin color, texture, turgor normal. No rashes or lesions Lymph nodes: Cervical, supraclavicular, and axillary nodes normal.  Lab Results  Component Value Date   HGBA1C 5.5 06/12/2014    Lab Results  Component Value Date   CREATININE 1.30 (H) 09/13/2016   CREATININE 0.92 05/29/2015   CREATININE 1.07 12/14/2014    Lab Results  Component Value Date   WBC 7.6 06/16/2014   HGB 15.8 06/12/2014   PLT 249 06/16/2014   GLUCOSE 92 09/13/2016   CHOL 179 09/13/2016  TRIG 281 (H) 09/13/2016   HDL 34 (L) 09/13/2016   LDLDIRECT 119 (H) 09/13/2016   LDLCALC 89 09/13/2016   ALT 21 09/13/2016   AST 14 09/13/2016   NA 139 09/13/2016   K 4.2 09/13/2016   CL 104 09/13/2016   CREATININE 1.30 (H) 09/13/2016   BUN 15 09/13/2016   CO2 25 09/13/2016   TSH 0.97 05/29/2015   PSA 0.4 09/13/2016   HGBA1C 5.5 06/12/2014   MICROALBUR 8.8 09/13/2016    No results found.  Assessment & Plan:   Problem List Items Addressed This Visit      Visit for preventive health examination    Annual comprehensive preventive exam was done as well as an evaluation and management of acute and chronic conditions .  During the course of the visit the patient was educated and counseled about appropriate screening and preventive services including :  diabetes screening, lipid analysis with projected  10 year  risk for CAD , nutrition counseling, prostate and colorectal cancer screening, and recommended immunizations.  Printed recommendations for health maintenance screenings was given.       Hyperlipidemia LDL goal <100    No prior statin therapy .  Taking red yeast rice twice daily.  Based on current lipid profile, the risk of clinically significant Coronary artery disease is 36%  over the next 10 years, using the Framingham risk calculator, Will recommend change from RYR to statin .  Lab Results  Component Value Date   CHOL 179 09/13/2016   HDL 34 (L) 09/13/2016   LDLCALC 89 09/13/2016   LDLDIRECT 119 (H) 09/13/2016   TRIG 281 (H) 09/13/2016   CHOLHDL 5.3 (H) 09/13/2016         Essential hypertension    he reports compliance with medication regimen  but has an elevated reading today in office.  He has been asked to check his BP at home and  submit readings for evaluation.  Renal function is lightly off compared to last year,  he is not fasting .  Will have him return in one week for repeat BMEt.  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  Lab Results  Component Value Date   CREATININE 1.30 (H) 09/13/2016   Lab Results  Component Value Date   NA 139 09/13/2016   K 4.2 09/13/2016   CL 104 09/13/2016   CO2 25 09/13/2016   Lab Results  Component Value Date   MICROALBUR 8.8 09/13/2016         Relevant Orders   Microalbumin / creatinine urine ratio (Completed)   Comprehensive metabolic panel (Completed)    Other Visit Diagnoses    Pure hypercholesterolemia    -  Primary    Relevant Orders   LDL cholesterol, direct (Completed)   Lipid panel (Completed)   Prostate cancer screening       Relevant Orders   PSA (Completed)      I have discontinued [name]'s terbinafine. I am also having him start on doxycycline. Additionally, I am having him maintain his Red Yeast Rice Extract, losartan, and amLODipine.  Meds ordered this encounter  Medications  . doxycycline (VIBRA-TABS) 100 MG tablet    Sig: Take 1 tablet (100 mg total) by mouth 2 (two) times daily.    Dispense:  20 tablet    Refill:  0    Medications Discontinued During This Encounter  Medication Reason  . terbinafine (LAMISIL) 1 % cream No longer needed (  for PRN medications)    Follow-up: No Follow-up on file.   Sherlene Shams, MD

## 2016-09-14 LAB — MICROALBUMIN / CREATININE URINE RATIO
Creatinine, Urine: 248 mg/dL (ref 20–370)
MICROALB/CREAT RATIO: 35 ug/mg{creat} — AB (ref <30)
Microalb, Ur: 8.8 mg/dL

## 2016-09-14 LAB — PSA: PSA: 0.4 ng/mL (ref <=4.0)

## 2016-09-15 ENCOUNTER — Encounter: Payer: Self-pay | Admitting: Internal Medicine

## 2016-09-15 NOTE — Assessment & Plan Note (Signed)

## 2016-09-15 NOTE — Assessment & Plan Note (Signed)
he reports compliance with medication regimen  but has an elevated reading today in office.  He has been asked to check his BP at home and  submit readings for evaluation.  Renal function is lightly off compared to last year,  he is not fasting .  Will have him return in one week for repeat BMEt.  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

## 2016-09-15 NOTE — Assessment & Plan Note (Addendum)
Declines statin therapy .  Taking red yeast rice twice daily.  Based on current lipid profile, the risk of clinically significant Coronary artery disease is 36%  over the next 10 years, using the Framingham risk calculator, Will recommend risk stratification with cardiac CT  .  Lab Results  Component Value Date   CHOL 179 09/13/2016   HDL 34 (L) 09/13/2016   LDLCALC 89 09/13/2016   LDLDIRECT 119 (H) 09/13/2016   TRIG 281 (H) 09/13/2016   CHOLHDL 5.3 (H) 09/13/2016

## 2016-09-18 ENCOUNTER — Other Ambulatory Visit: Payer: Self-pay | Admitting: Internal Medicine

## 2016-09-23 ENCOUNTER — Other Ambulatory Visit (INDEPENDENT_AMBULATORY_CARE_PROVIDER_SITE_OTHER): Payer: BLUE CROSS/BLUE SHIELD

## 2016-09-23 DIAGNOSIS — R944 Abnormal results of kidney function studies: Secondary | ICD-10-CM | POA: Diagnosis not present

## 2016-09-23 LAB — BASIC METABOLIC PANEL
BUN: 16 mg/dL (ref 6–23)
CHLORIDE: 101 meq/L (ref 96–112)
CO2: 25 mEq/L (ref 19–32)
CREATININE: 0.87 mg/dL (ref 0.40–1.50)
Calcium: 9.5 mg/dL (ref 8.4–10.5)
GFR: 93.78 mL/min (ref >60.00)
Glucose, Bld: 101 mg/dL — ABNORMAL HIGH (ref 70–99)
Potassium: 3.7 mEq/L (ref 3.5–5.1)
Sodium: 136 mEq/L (ref 135–145)

## 2016-12-10 ENCOUNTER — Other Ambulatory Visit
Admission: RE | Admit: 2016-12-10 | Discharge: 2016-12-10 | Disposition: A | Discharge status: Home / Self Care | Payer: BLUE CROSS/BLUE SHIELD | Source: Ambulatory Visit | Attending: Internal Medicine | Admitting: Internal Medicine

## 2016-12-10 ENCOUNTER — Encounter: Payer: Self-pay | Admitting: Internal Medicine

## 2016-12-10 ENCOUNTER — Ambulatory Visit (INDEPENDENT_AMBULATORY_CARE_PROVIDER_SITE_OTHER): Payer: BLUE CROSS/BLUE SHIELD | Admitting: Internal Medicine

## 2016-12-10 VITALS — BP 148/88 | HR 87 | Ht 64.0 in | Wt 183.0 lb

## 2016-12-10 DIAGNOSIS — E782 Mixed hyperlipidemia: Secondary | ICD-10-CM

## 2016-12-10 DIAGNOSIS — I10 Essential (primary) hypertension: Secondary | ICD-10-CM

## 2016-12-10 DIAGNOSIS — R9431 Abnormal electrocardiogram [ECG] [EKG]: Secondary | ICD-10-CM

## 2016-12-10 MED ORDER — HYDROCHLOROTHIAZIDE 25 MG PO TABS: 25.0000 mg | ORAL_TABLET | Freq: Every day | ORAL | 3 refills | Status: DC

## 2016-12-10 NOTE — Progress Notes (Signed)
New Outpatient Visit Date: 12/10/2016  Referring Provider: Sherlene Shams, MD [address] [place], [place] [address]  Chief Complaint: High blood pressure  HPI:  Mr. Dohrmann is a 66 y.o. male who is being seen today for the evaluation of hypertension and hyperlipidemia at the request of Sherlene Shams, MD. He has a history of hypertension and hyperlipidemia. Mr. Poole was previously seen once in the [facility] office by Dr. Daleen Squibb around 2011. Today, Mr. Hicks reports that he has been feeling well, specifically denying chest pain, shortness of breath, palpitations, and lightheadedness. He endorses some swelling in his hands from prior injuries but otherwise has not had any edema. He notes occasional left shoulder pain that he attributes to injuries. He is very active at work, climbing up multiple ladders and stairs and lifting heavy objects without any difficulty. He has never undergone previous cardiac testing other than an EKG. He notes this has been abnormal in the past but further workup was not recommended by Dr. Daleen Squibb. Home blood pressures are typically upper normal to elevated, never going below 130/80.  Mr. Istre has never been checked for sleep apnea. However, he does not believe that he snores much and typically awakens refreshed in the morning. He does not have any daytime somnolence.  --------------------------------------------------------------------------------------------------  Cardiovascular History & Procedures: Cardiovascular Problems:  Abnormal EKG  Risk Factors:  Hypertension, hyperlipidemia, male gender, age greater than 62, and family history  Cath/PCI:  None  CV Surgery:  None  EP Procedures and Devices:  None  Non-Invasive Evaluation(s):  None  Recent CV Pertinent Labs: Lab Results  Component Value Date   CHOL 179 09/13/2016   HDL 34 (L) 09/13/2016   LDLCALC 89 09/13/2016   LDLDIRECT 119 (H) 09/13/2016   TRIG 281 (H) 09/13/2016   CHOLHDL 5.3 (H) 09/13/2016   K 3.7 09/23/2016   BUN 16 09/23/2016   BUN 23 (A) 06/16/2014   CREATININE 0.87 09/23/2016   CREATININE 1.30 (H) 09/13/2016    --------------------------------------------------------------------------------------------------  Past Medical History:  Diagnosis Date  . HTN (hypertension)   . Other and unspecified hyperlipidemia     Past Surgical History:  Procedure Laterality Date  . ANTERIOR CRUCIATE LIGAMENT REPAIR Left 2004    Current Meds  Medication Sig  . amLODipine (NORVASC) 10 MG tablet TAKE 1 TABLET BY MOUTH DAILY  . losartan (COZAAR) 100 MG tablet TAKE ONE TABLET BY MOUTH EVERY DAY  . Red Yeast Rice Extract 600 MG CAPS Take 1 capsule by mouth 2 (two) times daily.     Allergies: Patient has no known allergies.  Social History   Social History  . Marital status: Married    Spouse name: N/A  . Number of children: N/A  . Years of education: N/A   Occupational History  . Not on file.   Social History Main Topics  . Smoking status: Never Smoker  . Smokeless tobacco: Never Used  . Alcohol use 0.0 oz/week     Comment: 1 drink every few months  . Drug use: No     Comment: Refused to answer  . Sexual activity: Not on file   Other Topics Concern  . Not on file   Social History Narrative  . No narrative on file    Family History  Problem Relation Age of Onset  . Dementia Mother   . Heart disease Father 3       CABG  . Mitral valve prolapse Father   . Aortic aneurysm  Father   . Hyperlipidemia Unknown        family Hx  . Heart disease Paternal Uncle        Valve replacements    Review of Systems: A 12-system review of systems was performed and was negative except as noted in the HPI.  --------------------------------------------------------------------------------------------------  Physical Exam: BP (!) 148/88 (BP Location: Right Arm, Patient Position: Sitting, Cuff Size: Normal)   Pulse 87   Ht  (1.626 m)    Wt 183 lb (83 kg)   BMI 31.41 kg/m   General:  Overweight man, seated comfortably in the exam room. HEENT: No conjunctival pallor or scleral icterus. Moist mucous membranes. OP clear. Neck: Supple without lymphadenopathy, thyromegaly, JVD, or HJR. No carotid bruit. Lungs: Normal work of breathing. Clear to auscultation bilaterally without wheezes or crackles. Heart: Regular rate and rhythm without murmurs, rubs, or gallops. Non-displaced PMI. Abd: Bowel sounds present. Soft, NT/ND without hepatosplenomegaly Ext: No lower extremity edema. Radial, PT, and DP pulses are 2+ bilaterally Skin: Warm and dry without rash. Neuro: CNIII-XII intact. Strength and fine-touch sensation intact in upper and lower extremities bilaterally. Psych: Normal mood and affect.  EKG:  Normal sinus rhythm with right axis deviation. No significant change from prior tracing in 2011.  Lab Results  Component Value Date   WBC 7.6 06/16/2014   HGB 15.8 06/12/2014   PLT 249 06/16/2014    Lab Results  Component Value Date   NA 136 09/23/2016   K 3.7 09/23/2016   CL 101 09/23/2016   CO2 25 09/23/2016   BUN 16 09/23/2016   CREATININE 0.87 09/23/2016   GLUCOSE 101 (H) 09/23/2016   ALT 21 09/13/2016    Lab Results  Component Value Date   CHOL 179 09/13/2016   HDL 34 (L) 09/13/2016   LDLCALC 89 09/13/2016   LDLDIRECT 119 (H) 09/13/2016   TRIG 281 (H) 09/13/2016   CHOLHDL 5.3 (H) 09/13/2016    --------------------------------------------------------------------------------------------------  ASSESSMENT AND PLAN: Abnormal EKG EKG demonstrates normal sinus rhythm with right superior axis deviation, similar to prior tracing in 2011. Mr. Denyse Amass has not had any significant symptoms, we have agreed to perform an echocardiogram in light of his abnormal EKG, history of hypertension, and family history of coronary and valvular heart disease.  Hypertension Blood pressure is suboptimally controlled at home. We have  agreed to add hydrochlorothiazide 25 mg daily. I will check a basic metabolic panel today and again in one month. We will pressure check in one month as well.  Hyperlipidemia Recent lipid panel was notable for a direct LDL of 119 and a calculated LDL of 89. We discussed lifestyle modifications and have agreed to defer statin therapy at this time.  Follow-up: Return to clinic in 3 months  Yvonne Kendall, MD 12/10/2016 3:05 PM

## 2016-12-10 NOTE — Patient Instructions (Signed)
Medication Instructions:  Your physician has recommended you make the following change in your medication:  1- START Hydrochlorothiazide 25 mg (1 tablet) by mouth once a day.   Labwork: 1- Your physician recommends that you return for lab work in: TODAY. (BMET). - Please go to the [facility]. You will check in at the front desk to the right as you walk into the atrium. Valet Parking is offered if needed.  2- Your physician recommends that you return for lab work in: 1 MONTH FOR BMP AND BLOOD PRESSURE CHECK.      Testing/Procedures: Your physician has requested that you have an echocardiogram. Echocardiography is a painless test that uses sound waves to create images of your heart. It provides your doctor with information about the size and shape of your heart and how well your heart's chambers and valves are working. This procedure takes approximately one hour. There are no restrictions for this procedure.    Follow-Up: Your physician recommends that you schedule a follow-up appointment in: 1 MONTH FOR BP CHECK AND LAB WORK (BMP).   Your physician recommends that you schedule a follow-up appointment in: 3 MONTHS WITH DR END.   If you need a refill on your cardiac medications before your next appointment, please call your pharmacy.   Echocardiogram An echocardiogram, or echocardiography, uses sound waves (ultrasound) to produce an image of your heart. The echocardiogram is simple, painless, obtained within a short period of time, and offers valuable information to your health care provider. The images from an echocardiogram can provide information such as:  Evidence of coronary artery disease (CAD).  Heart size.  Heart muscle function.  Heart valve function.  Aneurysm detection.  Evidence of a past heart attack.  Fluid buildup around the heart.  Heart muscle thickening.  Assess heart valve function.  Tell a health care provider about:  Any allergies you  have.  All medicines you are taking, including vitamins, herbs, eye drops, creams, and over-the-counter medicines.  Any problems you or family members have had with anesthetic medicines.  Any blood disorders you have.  Any surgeries you have had.  Any medical conditions you have.  Whether you are pregnant or may be pregnant. What happens before the procedure? No special preparation is needed. Eat and drink normally. What happens during the procedure?  In order to produce an image of your heart, gel will be applied to your chest and a wand-like tool (transducer) will be moved over your chest. The gel will help transmit the sound waves from the transducer. The sound waves will harmlessly bounce off your heart to allow the heart images to be captured in real-time motion. These images will then be recorded.  You may need an IV to receive a medicine that improves the quality of the pictures. What happens after the procedure? You may return to your normal schedule including diet, activities, and medicines, unless your health care provider tells you otherwise. This information is not intended to replace advice given to you by your health care provider. Make sure you discuss any questions you have with your health care provider. Document Released: 03/08/2000 Document Revised: 10/28/2015 Document Reviewed: 11/16/2012 Elsevier Interactive Patient Education  2017 [employer].

## 2016-12-19 ENCOUNTER — Other Ambulatory Visit: Payer: BLUE CROSS/BLUE SHIELD

## 2017-01-09 ENCOUNTER — Other Ambulatory Visit (INDEPENDENT_AMBULATORY_CARE_PROVIDER_SITE_OTHER): Payer: BLUE CROSS/BLUE SHIELD

## 2017-01-09 ENCOUNTER — Other Ambulatory Visit: Payer: BLUE CROSS/BLUE SHIELD

## 2017-01-09 ENCOUNTER — Ambulatory Visit (INDEPENDENT_AMBULATORY_CARE_PROVIDER_SITE_OTHER): Payer: BLUE CROSS/BLUE SHIELD | Admitting: *Deleted

## 2017-01-09 VITALS — BP 140/90 | HR 83 | Ht 60.0 in | Wt 185.0 lb

## 2017-01-09 DIAGNOSIS — R9431 Abnormal electrocardiogram [ECG] [EKG]: Secondary | ICD-10-CM | POA: Diagnosis not present

## 2017-01-09 DIAGNOSIS — I10 Essential (primary) hypertension: Secondary | ICD-10-CM

## 2017-01-09 NOTE — Patient Instructions (Addendum)
Your physician has requested that you regularly monitor and record your blood pressure readings at home. Please use the same machine at the same time of day to check your readings and record them to bring to your follow-up visit.  Please monitor blood pressures at home and give us a call if it stays 140/80 or higher.  Testing/Procedures: Your physician has requested that you have an echocardiogram. Echocardiography is a painless test that uses sound waves to create images of your heart. It provides your doctor with information about the size and shape of your heart and how well your heart's chambers and valves are working. This procedure takes approximately one hour. There are no restrictions for this procedure.    Follow-Up:  Keep scheduled follow up with Dr. Okey DupreEnd on 03/11/17 at 3:00 PM   It was a pleasure seeing you today here in the office. Please do not hesitate to give us a call back if you have any further questions. [phone]  Flemington CellarPamela A. RN, BSN

## 2017-01-09 NOTE — Progress Notes (Signed)
1.) Reason for visit: BMP & BP check  2.) Name of MD requesting visit: Dr. Okey DupreEnd  3.) H&P: Hypertension, Hyperlipidemia, and Abnormal EKG.  4.) ROS related to problem: Blood pressure today was 140/90. Patient reported that he was "doing well". He did report some leg cramping and thought it could be his medications. Labs done today and let him know that we would call him with results.   5.) Assessment and plan: Instructed patient to continue monitoring his blood pressures and call if they are 140/80 or higher. Verified his medications and stated that he thinks one of them is causing him cramping in his legs. Let him know that the labs we did today will check electrolytes. He thought he was supposed to have echocardiogram today but it appears that was canceled by him due to scheduling. 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 He told me to let Dr. Okey DupreEnd know that he was a crabby patient and that his time is valuable. Patient rescheduled echocardiogram.

## 2017-01-10 ENCOUNTER — Other Ambulatory Visit: Payer: Self-pay

## 2017-01-10 ENCOUNTER — Ambulatory Visit (INDEPENDENT_AMBULATORY_CARE_PROVIDER_SITE_OTHER): Payer: BLUE CROSS/BLUE SHIELD

## 2017-01-10 DIAGNOSIS — R9431 Abnormal electrocardiogram [ECG] [EKG]: Secondary | ICD-10-CM | POA: Diagnosis not present

## 2017-01-10 DIAGNOSIS — I10 Essential (primary) hypertension: Secondary | ICD-10-CM | POA: Diagnosis not present

## 2017-01-10 LAB — BASIC METABOLIC PANEL
BUN/Creatinine Ratio: 17 (ref 10–24)
BUN: 17 mg/dL (ref 8–27)
CALCIUM: 9.5 mg/dL (ref 8.6–10.2)
CHLORIDE: 103 mmol/L (ref 96–106)
CO2: 20 mmol/L (ref 20–29)
Creatinine, Ser: 0.98 mg/dL (ref 0.76–1.27)
GFR calc Af Amer: 94 mL/min/{1.73_m2} (ref >59)
GFR, EST NON AFRICAN AMERICAN: 81 mL/min/{1.73_m2} (ref >59)
Glucose: 106 mg/dL — ABNORMAL HIGH (ref 65–99)
POTASSIUM: 4.1 mmol/L (ref 3.5–5.2)
SODIUM: 140 mmol/L (ref 134–144)

## 2017-01-10 NOTE — Progress Notes (Signed)
Continue current medications and f/u as planned.  Yvonne Kendallhristopher Candise Crabtree, MD [facility] Pager: [phone]

## 2017-01-30 ENCOUNTER — Telehealth: Payer: Self-pay | Admitting: Internal Medicine

## 2017-01-30 NOTE — Telephone Encounter (Signed)
I agree with holding HCTZ to see if his symptoms improve.  He should continue to monitor his blood pressure and let us know if it is consistently elevated (greater than 140/90).  If his dizziness does not improve, further evaluation by his PCP is reasonable.  Yvonne Kendallhristopher Kaedin Hicklin, MD [facility] Pager: [phone]

## 2017-01-30 NOTE — Telephone Encounter (Signed)
Reviewed recommendations w/pt who is agreeable w/plan.

## 2017-01-30 NOTE — Telephone Encounter (Signed)
Pt reports feeling lightheaded since starting HCTZ 25mg  after Sept 18 OV. Yesterday, he also felt nauseated. He did not take HCTZ today and still "about lost my balance bending over".  He is asymptomatic as long as he doesn't move quickly or bend over.  He has not checked BP in several days but thinks it has been running 120s/70s-80s. He is unsure of HR. Denies any nasal or head congestion.  Denies chest pain or SOB.  He has had lightheadedness in the past which resolved on its own. Stable labs 1 month after adding HCTZ. BP 140/90 at Oct 18 nurse visit.  EF 55=60% per Oct 18 echo. Mild leakage aortic and mitral valves.  Pt will hold HCTZ for a few days to see if sx improve at which time he will call the office for further instructions. If sx do not improve, he should resume medication and follow up w/PCP for further evaluation.  Pt states he hates going to the doctor and finds it difficult to find the time to go. Encouraged pt follow the plan and to monitor BP, HR, weight and sx and call back with an update. Pt agreeable. Will route to Dr. Okey DupreEnd to make aware.

## 2017-01-30 NOTE — Telephone Encounter (Signed)
Dr. Okey DupreEnd had recently prescribed a blood pressure medication hydrochlorothiazide 25 mg  Since taking patient has been experiencing dizziness of various degrees and has felt nauseous Place call to advise

## 2017-02-25 ENCOUNTER — Other Ambulatory Visit: Payer: Self-pay

## 2017-02-25 MED ORDER — LOSARTAN POTASSIUM 100 MG PO TABS: 100.0000 mg | ORAL_TABLET | Freq: Every day | ORAL | 1 refills | Status: DC

## 2017-03-11 ENCOUNTER — Ambulatory Visit: Payer: BLUE CROSS/BLUE SHIELD | Admitting: Internal Medicine

## 2017-03-21 ENCOUNTER — Other Ambulatory Visit: Payer: Self-pay | Admitting: Internal Medicine

## 2017-09-16 ENCOUNTER — Other Ambulatory Visit: Payer: Self-pay | Admitting: Internal Medicine

## 2017-09-17 NOTE — Telephone Encounter (Signed)
Refilled: 03/24/2017 Last OV: 09/13/2016 Next OV: not scheduled

## 2017-09-29 ENCOUNTER — Other Ambulatory Visit: Payer: Self-pay | Admitting: Internal Medicine

## 2017-09-29 DIAGNOSIS — E782 Mixed hyperlipidemia: Secondary | ICD-10-CM

## 2017-09-29 DIAGNOSIS — I10 Essential (primary) hypertension: Secondary | ICD-10-CM

## 2017-09-29 DIAGNOSIS — Z0000 Encounter for general adult medical examination without abnormal findings: Secondary | ICD-10-CM

## 2017-09-30 NOTE — Telephone Encounter (Signed)
Please notify patient that the prescription was refilled for 30 days only because it requires labs and a  6 month follow up and  it has been 12 months since his last visit. . Fasting  Labs ordered.

## 2017-09-30 NOTE — Telephone Encounter (Signed)
Refilled: 02/25/2017 Last OV: 09/13/2016 Next OV: not scheduled

## 2017-10-09 NOTE — Telephone Encounter (Signed)
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

## 2017-10-24 ENCOUNTER — Ambulatory Visit: Payer: Self-pay

## 2017-10-24 DIAGNOSIS — W57XXXS Bitten or stung by nonvenomous insect and other nonvenomous arthropods, sequela: Secondary | ICD-10-CM

## 2017-10-24 NOTE — Telephone Encounter (Signed)
LMTCB. Need to clarify if there is redness at the area of the tick, if so has the redness spread.

## 2017-10-24 NOTE — Telephone Encounter (Signed)
Please advise 

## 2017-10-24 NOTE — Telephone Encounter (Signed)
Pt states he removed dead tick from the middle of his scrotum on Monday night. Pt states he thinks that the tick may have been attached for several days before it was removed. 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 Pt states he has experienced some loose bowel movements. Pt states that about 3 years ago he had a tick bite and experienced loose bowel movements and was given Doxycycline which helped to return his bowel movements to normal. Pt given home care but pt would also like to know if Dr. Melina Schoolsullo's recommend treatment with an antibiotic again.  Reason for Disposition . Tick bite with no complications  Answer Assessment - Initial Assessment Questions 1. TYPE of TICK: "Is it a wood tick or a deer tick?" If unsure, ask: "What size was the tick?" "Did it look more like a watermelon seed or a poppy seed?"      Bigger than a deer tick, about the size of opening of  Tweezers, fits in between that space 2. LOCATION: "Where is the tick bite located?"      Scrotum 3. ONSET: "How long do you think the tick was attached before you removed it?" (Hours or days)      Several days before removal 4. TETANUS: "When was the last tetanus booster?"      Within the last five years  Protocols used: TICK BITE-A-AH

## 2017-10-28 ENCOUNTER — Other Ambulatory Visit: Payer: Self-pay | Admitting: Internal Medicine

## 2017-10-28 NOTE — Telephone Encounter (Signed)
No,  In the absence of fevers, headaches,  body aches and localized skin reaction,  I do not recommend treatment with antibiotics.  His prior treatment was  not by me,  And  His previous Lyme disease test was NEGATIVE,  NOT POSITIVE as previoulsy interpreted by dr Adriana Simasook .

## 2017-10-28 NOTE — Telephone Encounter (Signed)
Spoke with pt and he stated that he is not experiencing any symptoms. He stated no fever, no body aches, no redness at the site. Pt stated that is "good".

## 2017-10-29 NOTE — Telephone Encounter (Signed)
LMTCB. Need to schedule an appt before any more refills. PEC may speak with pt.

## 2017-10-30 NOTE — Telephone Encounter (Signed)
Pt has scheduled an appointment for 11/21/17 @ 3:00pm. Can a partial refill be called in to last until that appointment? Please advise pt.

## 2017-10-30 NOTE — Telephone Encounter (Signed)
LMTCB. Need to scheduled pt an appt with Dr. Darrick Huntsmanullo before we can refill medication. PEC may speak with pt.

## 2017-11-21 ENCOUNTER — Ambulatory Visit (INDEPENDENT_AMBULATORY_CARE_PROVIDER_SITE_OTHER): Payer: Medicare HMO | Admitting: Internal Medicine

## 2017-11-21 ENCOUNTER — Encounter: Payer: Self-pay | Admitting: Internal Medicine

## 2017-11-21 VITALS — BP 140/88 | HR 71 | Temp 98.7°F | Resp 15 | Ht 60.0 in | Wt 189.0 lb

## 2017-11-21 DIAGNOSIS — E782 Mixed hyperlipidemia: Secondary | ICD-10-CM

## 2017-11-21 DIAGNOSIS — E876 Hypokalemia: Secondary | ICD-10-CM | POA: Diagnosis not present

## 2017-11-21 DIAGNOSIS — T502X5A Adverse effect of carbonic-anhydrase inhibitors, benzothiadiazides and other diuretics, initial encounter: Secondary | ICD-10-CM | POA: Diagnosis not present

## 2017-11-21 DIAGNOSIS — I080 Rheumatic disorders of both mitral and aortic valves: Secondary | ICD-10-CM

## 2017-11-21 DIAGNOSIS — Z7185 Encounter for immunization safety counseling: Secondary | ICD-10-CM

## 2017-11-21 DIAGNOSIS — I10 Essential (primary) hypertension: Secondary | ICD-10-CM | POA: Diagnosis not present

## 2017-11-21 DIAGNOSIS — Z7189 Other specified counseling: Secondary | ICD-10-CM

## 2017-11-21 DIAGNOSIS — Z0000 Encounter for general adult medical examination without abnormal findings: Secondary | ICD-10-CM

## 2017-11-21 DIAGNOSIS — E669 Obesity, unspecified: Secondary | ICD-10-CM | POA: Diagnosis not present

## 2017-11-21 MED ORDER — HYDROCHLOROTHIAZIDE 25 MG PO TABS: 25.0000 mg | ORAL_TABLET | Freq: Every day | ORAL | 3 refills | Status: DC

## 2017-11-21 MED ORDER — LOSARTAN POTASSIUM 100 MG PO TABS: 100.0000 mg | ORAL_TABLET | Freq: Every day | ORAL | 0 refills | Status: DC

## 2017-11-21 MED ORDER — DOXYCYCLINE HYCLATE 100 MG PO TABS: 100.0000 mg | ORAL_TABLET | Freq: Two times a day (BID) | ORAL | 0 refills | Status: DC

## 2017-11-21 NOTE — Assessment & Plan Note (Addendum)
He has suboptimal control on maximal doses of amlodipine , losartan and hctz, which is being changed to maxzide due to hypokalemia.   He has never had a sleep study but denies any symptoms of sleep apnea ,  Will rule out RAS

## 2017-11-21 NOTE — Patient Instructions (Addendum)
Your blood pressure is elevated.  I want you to get an Omran automatic BP monitor  And check BO once daily  For the next week or so and send me the readings   Goal for good BP control  is < 130/80   I am referring you for a doppler study to rule out renal artery stenosis .  This will be done at [facility].     Health Maintenance, Male A healthy lifestyle and preventive care is important for your health and wellness. Ask your health care provider about what schedule of regular examinations is right for you. What should I know about weight and diet? Eat a Healthy Diet  Eat plenty of vegetables, fruits, whole grains, low-fat dairy products, and lean protein.  Do not eat a lot of foods high in solid fats, added sugars, or salt.  Maintain a Healthy Weight Regular exercise can help you achieve or maintain a healthy weight. You should:  Do at least 150 minutes of exercise each week. The exercise should increase your heart rate and make you sweat (moderate-intensity exercise).  Do strength-training exercises at least twice a week.  Watch Your Levels of Cholesterol and Blood Lipids  Have your blood tested for lipids and cholesterol every 5 years starting at 65 years of age. If you are at high risk for heart disease, you should start having your blood tested when you are 65 years old. You may need to have your cholesterol levels checked more often if: ? Your lipid or cholesterol levels are high. ? You are older than 65 years of age. ? You are at high risk for heart disease.  What should I know about cancer screening? Many types of cancers can be detected early and may often be prevented. Lung Cancer  You should be screened every year for lung cancer if: ? You are a current smoker who has smoked for at least 30 years. ? You are a former smoker who has quit within the past 15 years.  Talk to your health care provider about your screening options, when you should start  screening, and how often you should be screened.  Colorectal Cancer  Routine colorectal cancer screening usually begins at 65 years of age and should be repeated every 5-10 years until you are 65 years old. You may need to be screened more often if early forms of precancerous polyps or small growths are found. Your health care provider may recommend screening at an earlier age if you have risk factors for colon cancer.  Your health care provider may recommend using home test kits to check for hidden blood in the stool.  A small camera at the end of a tube can be used to examine your colon (sigmoidoscopy or colonoscopy). This checks for the earliest forms of colorectal cancer.  Prostate and Testicular Cancer  Depending on your age and overall health, your health care provider may do certain tests to screen for prostate and testicular cancer.  Talk to your health care provider about any symptoms or concerns you have about testicular or prostate cancer.  Skin Cancer  Check your skin from head to toe regularly.  Tell your health care provider about any new moles or changes in moles, especially if: ? There is a change in a mole's size, shape, or color. ? You have a mole that is larger than a pencil eraser.  Always use sunscreen. Apply sunscreen liberally and repeat throughout the day.  Protect yourself  by wearing long sleeves, pants, a wide-brimmed hat, and sunglasses when outside.  What should I know about heart disease, diabetes, and high blood pressure?  If you are 7218-65 years of age, have your blood pressure checked every 3-5 years. If you are 65 years of age or older, have your blood pressure checked every year. You should have your blood pressure measured twice-once when you are at a hospital or clinic, and once when you are not at a hospital or clinic. Record the average of the two measurements. To check your blood pressure when you are not at a hospital or clinic, you can use: ? An  automated blood pressure machine at a pharmacy. ? A home blood pressure monitor.  Talk to your health care provider about your target blood pressure.  If you are between 6245-65 years old, ask your health care provider if you should take aspirin to prevent heart disease.  Have regular diabetes screenings by checking your fasting blood sugar level. ? If you are at a normal weight and have a low risk for diabetes, have this test once every three years after the age of 65. ? If you are overweight and have a high risk for diabetes, consider being tested at a younger age or more often.  A one-time screening for abdominal aortic aneurysm (AAA) by ultrasound is recommended for men aged 65-75 years who are current or former smokers. What should I know about preventing infection? Hepatitis B If you have a higher risk for hepatitis B, you should be screened for this virus. Talk with your health care provider to find out if you are at risk for hepatitis B infection. Hepatitis C Blood testing is recommended for:  Everyone born from [id number] through 1965.  Anyone with known risk factors for hepatitis C.  Sexually Transmitted Diseases (STDs)  You should be screened each year for STDs including gonorrhea and chlamydia if: ? You are sexually active and are younger than 65 years of age. ? You are older than 65 years of age and your health care provider tells you that you are at risk for this type of infection. ? Your sexual activity has changed since you were last screened and you are at an increased risk for chlamydia or gonorrhea. Ask your health care provider if you are at risk.  Talk with your health care provider about whether you are at high risk of being infected with HIV. Your health care provider may recommend a prescription medicine to help prevent HIV infection.  What else can I do?  Schedule regular health, dental, and eye exams.  Stay current with your vaccines (immunizations).  Do not use  any tobacco products, such as cigarettes, chewing tobacco, and e-cigarettes. If you need help quitting, ask your health care provider.  Limit alcohol intake to no more than 2 drinks per day. One drink equals 12 ounces of beer, 5 ounces of wine, or 1 ounces of hard liquor.  Do not use street drugs.  Do not share needles.  Ask your health care provider for help if you need support or information about quitting drugs.  Tell your health care provider if you often feel depressed.  Tell your health care provider if you have ever been abused or do not feel safe at home. This information is not intended to replace advice given to you by your health care provider. Make sure you discuss any questions you have with your health care provider. Document Released: 09/07/2007 Document Revised: 11/08/2015  Document Reviewed: 12/13/2014 Elsevier Interactive Patient Education  [employer].

## 2017-11-21 NOTE — Progress Notes (Signed)
Patient ID: Frederick Markos., male    DOB: 06/18/1950  Age: 67 y.o. MRN: [id number]  The patient is here for annual preventive  examination and management of other chronic and acute problems  Lab Results  Component Value Date   PSA 0.4 11/21/2017   PSA 0.4 09/13/2016   PSA 0.39 05/29/2015   .  declines Prevnar and Influenza/   The risk factors are reflected in the social history.  The roster of all physicians providing medical care to patient - is listed in the Snapshot section of the chart.  Activities of daily living:  The patient is 100% independent in all ADLs: dressing, toileting, feeding as well as independent mobility  Home safety : The patient has smoke detectors in the home. They wear seatbelts.  There are no firearms at home. There is no violence in the home.   There is no risks for hepatitis, STDs or HIV. There is no   history of blood transfusion. They have no travel history to infectious disease endemic areas of the world.  The patient has seen their dentist in the last six month. They have seen their eye doctor in the last year. They admit to slight hearing difficulty with regard to whispered voices and some television programs.  They have deferred audiologic testing in the last year.  They do not  have excessive sun exposure. Discussed the need for sun protection: hats, long sleeves and use of sunscreen if there is significant sun exposure.   Diet: the importance of a healthy diet is discussed. They do have a healthy diet.  The benefits of regular aerobic exercise were discussed. She walks 4 times per week ,  20 minutes.   Depression screen: there are no signs or vegative symptoms of depression- irritability, change in appetite, anhedonia, sadness/tearfullness.  Cognitive assessment: the patient manages all their financial and personal affairs and is actively engaged. They could relate day,date,year and events; recalled 2/3 objects at 3 minutes; performed clock-face test  normally.  The following portions of the patient's history were reviewed and updated as appropriate: allergies, current medications, past family history, past medical history,  past surgical history, past social history  and problem list.  Visual acuity was not assessed per patient preference since she has regular follow up with her ophthalmologist. Hearing and body mass index were assessed and reviewed.   During the course of the visit the patient was educated and counseled about appropriate screening and preventive services including : fall prevention , diabetes screening, nutrition counseling, colorectal cancer screening, and recommended immunizations.    CC: Diagnoses of Essential hypertension, Visit for preventive health examination, Mixed hyperlipidemia, Diuretic-induced hypokalemia, Mitral valve insufficiency and aortic valve insufficiency, Vaccine counseling, and Obesity (BMI 30-39.9) were pertinent to this visit.   HTN:  Patient is taking his 3 medications as prescribed and notes no adverse effects.  Home BP readings have been done about once per week and are  generally < 130/80 .  he is avoiding added salt in her diet and walking regularly about 3 times per week for exercise  .  Taking L arginine based on [facility]   Recurrent tick bites   None recently  Took doxycyline  And systemic symptoms of body aches resolved   Vertigo comes and goes   Positional No headaches or vision changes       History Frederick Pace has a past medical history of HTN (hypertension) and Other and unspecified hyperlipidemia.   He has a  past surgical history that includes Anterior cruciate ligament repair (Left, 2004).   His family history includes Aortic aneurysm in his father; Dementia in his mother; Heart disease in his paternal uncle; Heart disease (age of onset: 24) in his father; Hyperlipidemia in his unknown relative; Mitral valve prolapse in his father.He reports that he has never smoked. He has  never used smokeless tobacco. He reports that he drinks alcohol. He reports that he does not use drugs.  Outpatient Medications Prior to Visit  Medication Sig Dispense Refill  . amLODipine (NORVASC) 10 MG tablet TAKE ONE TABLET BY MOUTH EVERY DAY 30 tablet 5  . Red Yeast Rice Extract 600 MG CAPS Take 1 capsule by mouth 2 (two) times daily.     [name] losartan (COZAAR) 100 MG tablet TAKE ONE TABLET EVERY DAY 30 tablet 0  . hydrochlorothiazide (HYDRODIURIL) 25 MG tablet Take 1 tablet (25 mg total) by mouth daily. 90 tablet 3   No facility-administered medications prior to visit.     Review of Systems   Patient denies headache, fevers, malaise, unintentional weight loss, skin rash, eye pain, sinus congestion and sinus pain, sore throat, dysphagia,  hemoptysis , cough, dyspnea, wheezing, chest pain, palpitations, orthopnea, edema, abdominal pain, nausea, melena, diarrhea, constipation, flank pain, dysuria, hematuria, urinary  Frequency, nocturia, numbness, tingling, seizures,  Focal weakness, Loss of consciousness,  Tremor, insomnia, depression, anxiety, and suicidal ideation.      Objective:  BP 140/88 (BP Location: Left Arm, Patient Position: Sitting, Cuff Size: Normal)   Pulse 71   Temp 98.7 F (37.1 C) (Oral)   Resp 15   Ht 5' (1.524 m)   Wt 189 lb (85.7 kg)   SpO2 97%   BMI 36.91 kg/m   Physical Exam   General appearance: alert, cooperative and appears stated age Ears: normal TM's and external ear canals both ears Throat: lips, mucosa, and tongue normal; teeth and gums normal Neck: no adenopathy, no carotid bruit, supple, symmetrical, trachea midline and thyroid not enlarged, symmetric, no tenderness/mass/nodules Back: symmetric, no curvature. ROM normal. No CVA tenderness. Lungs: clear to auscultation bilaterally Heart: regular rate and rhythm, S1, S2 normal, no murmur, click, rub or gallop Abdomen: soft, non-tender; bowel sounds normal; no masses,  no organomegaly Pulses: 2+ and  symmetric Skin: Skin color, texture, turgor normal. No rashes or lesions Lymph nodes: Cervical, supraclavicular, and axillary nodes normal.    Assessment & Plan:   Problem List Items Addressed This Visit    Diuretic-induced hypokalemia    Potassium supplement sent.  Change in  therapy from hctz to maxzide  Repeat one month       Relevant Orders   Basic metabolic panel   Essential hypertension    He has suboptimal control on maximal doses of amlodipine , losartan and hctz, which is being changed to maxzide due to hypokalemia.   He has never had a sleep study but denies any symptoms of sleep apnea ,  Will rule out RAS       Relevant Medications   losartan (COZAAR) 100 MG tablet   triamterene-hydrochlorothiazide (MAXZIDE-25) 37.5-25 MG tablet   Other Relevant Orders   Ambulatory referral to Vascular Surgery   Mitral valve insufficiency and aortic valve insufficiency    By ECHO Oct 2018.  Aggressive BP control recommended.        Relevant Medications   losartan (COZAAR) 100 MG tablet   triamterene-hydrochlorothiazide (MAXZIDE-25) 37.5-25 MG tablet   Mixed hyperlipidemia    Declines statin therapy .  Taking red yeast rice twice daily.  Based on current lipid profile, the risk of clinically significant Coronary artery disease is 41%  over the next 10 years, using the Framingham risk calculator.  He has had a noninvasive evaluation by Cardiology for abnormal ELG with TTE which shoed no WMA,  And mild mitral valve insufficiency.  Lab Results  Component Value Date   CHOL 216 (H) 11/21/2017   HDL 33 (L) 11/21/2017   LDLCALC 147 (H) 11/21/2017   LDLDIRECT 119 (H) 09/13/2016   TRIG 226 (H) 11/21/2017   CHOLHDL 6.5 (H) 11/21/2017         Relevant Medications   losartan (COZAAR) 100 MG tablet   triamterene-hydrochlorothiazide (MAXZIDE-25) 37.5-25 MG tablet   Other Relevant Orders   Lipid panel   Obesity (BMI 30-39.9)    I have addressed  BMI and recommended wt loss of 10% of  body weigh over the next 6 months using a low glycemic index diet and regular exercise a minimum of 5 days per week.        Vaccine counseling    He has declined Pneumonia vaccine as well      Visit for preventive health examination      I have discontinued Frederick EmeryMarshall P. Condron Jr.'s hydrochlorothiazide and hydrochlorothiazide. I have also changed his losartan. Additionally, I am having him start on doxycycline, potassium chloride SA, and triamterene-hydrochlorothiazide. Lastly, I am having him maintain his Red Yeast Rice Extract and amLODipine.  Meds ordered this encounter  Medications  . doxycycline (VIBRA-TABS) 100 MG tablet    Sig: Take 1 tablet (100 mg total) by mouth 2 (two) times daily.    Dispense:  20 tablet    Refill:  0    Keep on file for future use  . DISCONTD: hydrochlorothiazide (HYDRODIURIL) 25 MG tablet    Sig: Take 1 tablet (25 mg total) by mouth daily.    Dispense:  90 tablet    Refill:  3  . losartan (COZAAR) 100 MG tablet    Sig: Take 1 tablet (100 mg total) by mouth daily.    Dispense:  30 tablet    Refill:  0  . potassium chloride SA (K-DUR,KLOR-CON) 20 MEQ tablet    Sig: Take 1 tablet (20 mEq total) by mouth daily.    Dispense:  5 tablet    Refill:  0  . triamterene-hydrochlorothiazide (MAXZIDE-25) 37.5-25 MG tablet    Sig: Take 1 tablet by mouth daily.    Dispense:  90 tablet    Refill:  3    REPLACING  HCTZ    Medications Discontinued During This Encounter  Medication Reason  . hydrochlorothiazide (HYDRODIURIL) 25 MG tablet Reorder  . losartan (COZAAR) 100 MG tablet Reorder  . hydrochlorothiazide (HYDRODIURIL) 25 MG tablet     Follow-up: Return in about 6 months (around 05/23/2018).   Sherlene Shamseresa L Tilla Wilborn, MD

## 2017-11-22 LAB — COMPREHENSIVE METABOLIC PANEL
AG Ratio: 1.9 (calc) (ref 1.0–2.5)
ALBUMIN MSPROF: 4.7 g/dL (ref 3.6–5.1)
ALKALINE PHOSPHATASE (APISO): 67 U/L (ref 40–115)
ALT: 23 U/L (ref 9–46)
AST: 16 U/L (ref 10–35)
BILIRUBIN TOTAL: 0.6 mg/dL (ref 0.2–1.2)
BUN: 15 mg/dL (ref 7–25)
CALCIUM: 9.6 mg/dL (ref 8.6–10.3)
CO2: 24 mmol/L (ref 20–32)
Chloride: 97 mmol/L — ABNORMAL LOW (ref 98–110)
Creat: 1 mg/dL (ref 0.70–1.25)
Globulin: 2.5 g/dL (calc) (ref 1.9–3.7)
Glucose, Bld: 93 mg/dL (ref 65–99)
POTASSIUM: 3.2 mmol/L — AB (ref 3.5–5.3)
Sodium: 135 mmol/L (ref 135–146)
Total Protein: 7.2 g/dL (ref 6.1–8.1)

## 2017-11-22 LAB — LIPID PANEL
CHOL/HDL RATIO: 6.5 (calc) — AB (ref <5.0)
Cholesterol: 216 mg/dL — ABNORMAL HIGH (ref <200)
HDL: 33 mg/dL — ABNORMAL LOW (ref >40)
LDL Cholesterol (Calc): 147 mg/dL (calc) — ABNORMAL HIGH
NON-HDL CHOLESTEROL (CALC): 183 mg/dL — AB (ref <130)
TRIGLYCERIDES: 226 mg/dL — AB (ref <150)

## 2017-11-22 LAB — PSA: PSA: 0.4 ng/mL (ref <=4.0)

## 2017-11-23 DIAGNOSIS — I080 Rheumatic disorders of both mitral and aortic valves: Secondary | ICD-10-CM | POA: Insufficient documentation

## 2017-11-23 DIAGNOSIS — E669 Obesity, unspecified: Secondary | ICD-10-CM | POA: Insufficient documentation

## 2017-11-23 DIAGNOSIS — T502X5A Adverse effect of carbonic-anhydrase inhibitors, benzothiadiazides and other diuretics, initial encounter: Secondary | ICD-10-CM

## 2017-11-23 DIAGNOSIS — E876 Hypokalemia: Secondary | ICD-10-CM | POA: Insufficient documentation

## 2017-11-23 MED ORDER — TRIAMTERENE-HCTZ 37.5-25 MG PO TABS: 1.0000 | ORAL_TABLET | Freq: Every day | ORAL | 3 refills | Status: DC

## 2017-11-23 MED ORDER — POTASSIUM CHLORIDE CRYS ER 20 MEQ PO TBCR: 20.0000 meq | EXTENDED_RELEASE_TABLET | Freq: Every day | ORAL | 0 refills | Status: DC

## 2017-11-23 NOTE — Assessment & Plan Note (Addendum)
Potassium supplement sent.  Change in  therapy from hctz to maxzide  Repeat one month

## 2017-11-23 NOTE — Assessment & Plan Note (Signed)
I have addressed  BMI and recommended wt loss of 10% of body weigh over the next 6 months using a low glycemic index diet and regular exercise a minimum of 5 days per week.   

## 2017-11-23 NOTE — Assessment & Plan Note (Signed)
By [facility] Oct 2018.  Aggressive BP control recommended.

## 2017-11-23 NOTE — Assessment & Plan Note (Signed)
He has declined Pneumonia vaccine as well

## 2017-11-23 NOTE — Assessment & Plan Note (Signed)
Declines statin therapy .  Taking red yeast rice twice daily.  Based on current lipid profile, the risk of clinically significant Coronary artery disease is 41%  over the next 10 years, using the Framingham risk calculator.  He has had a noninvasive evaluation by Cardiology for abnormal ELG with TTE which shoed no WMA,  And mild mitral valve insufficiency.  Lab Results  Component Value Date   CHOL 216 (H) 11/21/2017   HDL 33 (L) 11/21/2017   LDLCALC 147 (H) 11/21/2017   LDLDIRECT 119 (H) 09/13/2016   TRIG 226 (H) 11/21/2017   CHOLHDL 6.5 (H) 11/21/2017

## 2017-11-28 ENCOUNTER — Ambulatory Visit (INDEPENDENT_AMBULATORY_CARE_PROVIDER_SITE_OTHER): Payer: Medicare HMO

## 2017-11-28 ENCOUNTER — Other Ambulatory Visit: Payer: Self-pay | Admitting: Internal Medicine

## 2017-11-28 DIAGNOSIS — I10 Essential (primary) hypertension: Secondary | ICD-10-CM | POA: Diagnosis not present

## 2017-11-30 ENCOUNTER — Encounter: Payer: Self-pay | Admitting: Internal Medicine

## 2017-11-30 DIAGNOSIS — I701 Atherosclerosis of renal artery: Secondary | ICD-10-CM | POA: Insufficient documentation

## 2017-11-30 DIAGNOSIS — R93422 Abnormal radiologic findings on diagnostic imaging of left kidney: Secondary | ICD-10-CM | POA: Insufficient documentation

## 2017-12-01 ENCOUNTER — Other Ambulatory Visit: Payer: Self-pay | Admitting: Internal Medicine

## 2017-12-02 ENCOUNTER — Other Ambulatory Visit: Payer: Self-pay | Admitting: Internal Medicine

## 2017-12-02 DIAGNOSIS — I701 Atherosclerosis of renal artery: Secondary | ICD-10-CM

## 2017-12-02 DIAGNOSIS — Q600 Renal agenesis, unilateral: Secondary | ICD-10-CM

## 2017-12-12 ENCOUNTER — Encounter (INDEPENDENT_AMBULATORY_CARE_PROVIDER_SITE_OTHER): Payer: Self-pay | Admitting: Vascular Surgery

## 2017-12-12 ENCOUNTER — Ambulatory Visit (INDEPENDENT_AMBULATORY_CARE_PROVIDER_SITE_OTHER): Payer: Medicare HMO | Admitting: Vascular Surgery

## 2017-12-12 VITALS — BP 142/81 | HR 79 | Resp 17 | Ht 64.0 in | Wt 186.0 lb

## 2017-12-12 DIAGNOSIS — I150 Renovascular hypertension: Secondary | ICD-10-CM | POA: Diagnosis not present

## 2017-12-12 DIAGNOSIS — I701 Atherosclerosis of renal artery: Secondary | ICD-10-CM | POA: Diagnosis not present

## 2017-12-12 DIAGNOSIS — E782 Mixed hyperlipidemia: Secondary | ICD-10-CM

## 2017-12-12 NOTE — Assessment & Plan Note (Signed)
lipid control important in reducing the progression of atherosclerotic disease.   

## 2017-12-12 NOTE — Patient Instructions (Signed)
Renal Artery Stenosis Renal artery stenosis (RAS) is narrowing of the artery that carries blood to your kidneys. It can affect one or both kidneys. Your kidneys filter waste and extra fluid from your blood. You get rid of the waste and fluid when you urinate. Your kidneys also make an important chemical messenger (hormone) called renin. Renin helps regulate your blood pressure. The first sign of RAS may be high blood pressure. Over time, other symptoms can develop. What are the causes? Plaque buildup in your arteries (atherosclerosis) is the main cause of RAS. The plaques that cause this are made up of:  Fat.  Cholesterol.  Calcium.  Other substances.  As these substances build up in your renal artery, this slows the blood supply to your kidneys. The lack of blood and oxygen causes the signs and symptoms of RAS. A much less common cause of RAS is a disease called fibromuscular dysplasia. This disease causes abnormal cell growth that narrows the renal artery. It is not related to atherosclerosis. It occurs mostly in women who are 25-50 years old. It may be passed down through families. What increases the risk? You may be at risk for renal artery stenosis if you:  Are a man who is at least 65 years old.  Are a woman who is at least 65 years old.  Have high blood pressure.  Have high cholesterol.  Are a smoker.  Abuse alcohol.  Have diabetes or prediabetes.  Are overweight.  Have a family history of early heart disease.  What are the signs or symptoms? RAS usually develops slowly. You may not have any signs or symptoms at first. The earliest signs may be:  Developing high blood pressure.  A sudden increase in existing high blood pressure.  No longer responding to medicine that used to control your blood pressure.  Later signs and symptoms are due to kidney damage. They may include:  Fatigue.  Shortness of breath.  Swollen legs and feet.  Dry  skin.  Headaches.  Muscle cramps.  Loss of appetite.  Nausea or vomiting.  How is this diagnosed? Your health care provider may suspect RAS based on changes in your blood pressure and your risk factors. A physical exam will be done. Your health care provider may use a stethoscope to listen for a whooshing sound (bruit) that can occur where the renal artery is blocking blood flow. Several tests may be done to confirm a diagnosis of RAS. These may include:  Blood and urine tests to check your kidney function.  Imaging tests of your kidneys, such as: ? A test that involves using sound waves to create an image of your kidneys and the blood flow to your kidneys (ultrasound). ? A test in which dye is injected into one of your blood vessels so images can be taken as the dye flows through your renal arteries (angiogram). These tests can be done using X-rays, a CT scan (computed tomography angiogram, CTA), or a type of MRI (magnetic resonance angiogram, MRA).  How is this treated? Making lifestyle changes to reduce your risk factors is the first treatment option for early RAS. If the blood flow to one of your kidneys is cut by more than half, you may need medicine to:  Lower your blood pressure. This is the main medical treatment for RAS. You may need more than one type of medicine for this. The two types that work best for RAS are: ? ACE inhibitors. ? Angiotensin receptor blockers.  Reduce fluid   in the body (diuretics).  Lower your cholesterol (statins).  If medicine is not enough to control RAS, you may need surgery. This may involve:  Threading a tube with an inflatable balloon into the renal artery to force it open (angioplasty).  Removing plaque from inside the artery (endarterectomy).  Follow these instructions at home:  Take medicines only as directed by your health care provider.  Make any lifestyle changes recommended by your health care provider. This may include: ? Working  with a dietitian to maintain a heart-healthy diet. This type of diet is low in saturated fat, salt, and added sugar. ? Starting an exercise program as directed by your health care provider. ? Maintaining a healthy weight. ? Quitting smoking. ? Not abusing alcohol.  Keep all follow-up visits as directed by your health care provider. This is important. Contact a health care provider if:  Your symptoms of RAS are not getting better.  Your symptoms are changing or getting worse. Get help right away if:  You have very bad pain in your back or abdomen.  You have blood in your urine. This information is not intended to replace advice given to you by your health care provider. Make sure you discuss any questions you have with your health care provider. Document Released: 12/05/2004 Document Revised: 08/17/2015 Document Reviewed: 06/24/2013 Elsevier Interactive Patient Education  2018 Elsevier Inc.  

## 2017-12-12 NOTE — Progress Notes (Signed)
Patient ID: Frederick Dierks., male   DOB: 14-Feb-1951, 67 y.o.   MRN: [id number]  Chief Complaint  Patient presents with  . Follow-up    ultrasound    HPI [facility] Frederick Hackler. is a 67 y.o. male.  I am asked to see the patient by Dr. Darrick Huntsman for evaluation of renal artery stenosis.  The patient reports high blood pressure which has become more difficult to control over the years.  He is now on 3 agents with suboptimal blood pressure control.  He is mildly hypertensive today and has been taking his medicines regularly.  He has been much more hypertensive in the past.  1 of the 3 medicines is actually a combination medicine so technically he is on 4 medications.  As far as he knows, his renal function is okay.  He was unaware of a solitary kidney until his ultrasound.  A couple of weeks ago, he underwent a renal artery duplex which showed elevated velocities consistent with a greater than 60% stenosis of right renal artery which appears to be a solitary kidney.   Past Medical History:  Diagnosis Date  . HTN (hypertension)   . Other and unspecified hyperlipidemia     Past Surgical History:  Procedure Laterality Date  . ANTERIOR CRUCIATE LIGAMENT REPAIR Left 2004    Family History  Problem Relation Age of Onset  . Dementia Mother   . Heart disease Father 82       CABG  . Mitral valve prolapse Father   . Aortic aneurysm Father   . Hyperlipidemia Unknown        family Hx  . Heart disease Paternal Uncle        Valve replacements     Social History Social History   Tobacco Use  . Smoking status: Never Smoker  . Smokeless tobacco: Never Used  Substance Use Topics  . Alcohol use: Yes    Alcohol/week: 0.0 standard drinks    Comment: 1 drink every few months  . Drug use: No    Comment: Refused to answer     No Known Allergies  Current Outpatient Medications  Medication Sig Dispense Refill  . amLODipine (NORVASC) 10 MG tablet TAKE ONE TABLET BY MOUTH EVERY DAY 30 tablet  5  . losartan (COZAAR) 100 MG tablet TAKE ONE TABLET EVERY DAY 30 tablet 0  . Red Yeast Rice Extract 600 MG CAPS Take 1 capsule by mouth 2 (two) times daily.     [name] doxycycline (VIBRA-TABS) 100 MG tablet Take 1 tablet (100 mg total) by mouth 2 (two) times daily. 20 tablet 0  . potassium chloride SA (K-DUR,KLOR-CON) 20 MEQ tablet Take 1 tablet (20 mEq total) by mouth daily. (Patient not taking: Reported on 12/12/2017) 5 tablet 0  . triamterene-hydrochlorothiazide (MAXZIDE-25) 37.5-25 MG tablet Take 1 tablet by mouth daily. (Patient not taking: Reported on 12/12/2017) 90 tablet 3   No current facility-administered medications for this visit.       REVIEW OF SYSTEMS (Negative unless checked)  Constitutional: [] Weight loss  [] Fever  [] Chills Cardiac: [] Chest pain   [] Chest pressure   [] Palpitations   [] Shortness of breath when laying flat   [] Shortness of breath at rest   [] Shortness of breath with exertion. Vascular:  [] Pain in legs with walking   [] Pain in legs at rest   [] Pain in legs when laying flat   [] Claudication   [] Pain in feet when walking  [] Pain in feet at rest  [] Pain  in feet when laying flat   [] History of DVT   [] Phlebitis   [] Swelling in legs   [] Varicose veins   [] Non-healing ulcers Pulmonary:   [] Uses home oxygen   [] Productive cough   [] Hemoptysis   [] Wheeze  [] COPD   [] Asthma Neurologic:  [] Dizziness  [] Blackouts   [] Seizures   [] History of stroke   [] History of TIA  [] Aphasia   [] Temporary blindness   [] Dysphagia   [] Weakness or numbness in arms   [] Weakness or numbness in legs Musculoskeletal:  [x] Arthritis   [] Joint swelling   [] Joint pain   [] Low back pain Hematologic:  [] Easy bruising  [] Easy bleeding   [] Hypercoagulable state   [] Anemic  [] Hepatitis Gastrointestinal:  [] Blood in stool   [] Vomiting blood  [] Gastroesophageal reflux/heartburn   [] Abdominal pain Genitourinary:  [] Chronic kidney disease   [] Difficult urination  [] Frequent urination  [] Burning with urination    [] Hematuria Skin:  [] Rashes   [] Ulcers   [] Wounds Psychological:  [] History of anxiety   []  History of major depression.    Physical Exam BP (!) 142/81   Pulse 79   Resp 17   Ht 5\' 4"  (1.626 m)   Wt 186 lb (84.4 kg)   BMI 31.93 kg/m  Gen:  WD/WN, NAD Head: [place]/AT, No temporalis wasting.  Ear/Nose/Throat: Hearing grossly intact, nares w/o erythema or drainage, oropharynx w/o Erythema/Exudate Eyes: Conjunctiva clear, sclera non-icteric  Neck: trachea midline.  No bruit or JVD.  Pulmonary:  Good air movement, clear to auscultation bilaterally.  Cardiac: RRR, normal S1, S2, no Murmurs, rubs or gallops. Vascular:  Vessel Right Left  Radial Palpable Palpable                          PT Palpable Palpable  DP Palpable Palpable   Gastrointestinal: soft, non-tender/non-distended.  Musculoskeletal: M/S 5/5 throughout.  Extremities without ischemic changes.  No deformity or atrophy. Neurologic: Sensation grossly intact in extremities.  Symmetrical.  Speech is fluent. Motor exam as listed above. Psychiatric: Judgment intact, Mood & affect appropriate for pt's clinical situation. Dermatologic: No rashes or ulcers noted.  No cellulitis or open wounds.    Radiology No results found.  Labs Recent Results (from the past 2160 hour(s))  Comprehensive metabolic panel     Status: Abnormal   Collection Time: 11/21/17  3:59 PM  Result Value Ref Range   Glucose, Bld 93 65 - 99 mg/dL    Comment: .            Fasting reference interval .    BUN 15 7 - 25 mg/dL   Creat 1.611.00 0.960.70 - 0.451.25 mg/dL    Comment: For patients >65 years of age, the reference limit for Creatinine is approximately 13% higher for people identified as African-American. .    BUN/Creatinine Ratio NOT APPLICABLE 6 - 22 (calc)   Sodium 135 135 - 146 mmol/L   Potassium 3.2 (L) 3.5 - 5.3 mmol/L   Chloride 97 (L) 98 - 110 mmol/L   CO2 24 20 - 32 mmol/L   Calcium 9.6 8.6 - 10.3 mg/dL   Total Protein 7.2 6.1 - 8.1 g/dL    Albumin 4.7 3.6 - 5.1 g/dL   Globulin 2.5 1.9 - 3.7 g/dL (calc)   AG Ratio 1.9 1.0 - 2.5 (calc)   Total Bilirubin 0.6 0.2 - 1.2 mg/dL   Alkaline phosphatase (APISO) 67 40 - 115 U/L   AST 16 10 - 35 U/L   ALT  23 9 - 46 U/L  PSA     Status: None   Collection Time: 11/21/17  3:59 PM  Result Value Ref Range   PSA 0.4 < OR = 4.0 ng/mL    Comment: The total PSA value from this assay system is  standardized against the WHO standard. The test  result will be approximately 20% lower when compared  to the equimolar-standardized total PSA (Beckman  Coulter). Comparison of serial PSA results should be  interpreted with this fact in mind. . This test was performed using the Siemens  chemiluminescent method. Values obtained from  different assay methods cannot be used interchangeably. PSA levels, regardless of value, should not be interpreted as absolute evidence of the presence or absence of disease.   Lipid panel     Status: Abnormal   Collection Time: 11/21/17  3:59 PM  Result Value Ref Range   Cholesterol 216 (H) <200 mg/dL   HDL 33 (L) >78 mg/dL   Triglycerides 295 (H) <150 mg/dL    Comment: . If a non-fasting specimen was collected, consider repeat triglyceride testing on a fasting specimen if clinically indicated.  Perry Mount et al. J. of Clin. Lipidol. 2015;9:129-169. [name]    LDL Cholesterol (Calc) 147 (H) mg/dL (calc)    Comment: Reference range: <100 . Desirable range <100 mg/dL for primary prevention;   <70 mg/dL for patients with CHD or diabetic patients  with > or = 2 CHD risk factors. [name] LDL-C is now calculated using the Martin-Hopkins  calculation, which is a validated novel method providing  better accuracy than the Friedewald equation in the  estimation of LDL-C.  Horald Pollen et al. Lenox Ahr. 6213;086(57): 2061-2068  (http://education.[facility].com/faq/FAQ164)    Total CHOL/HDL Ratio 6.5 (H) <5.0 (calc)   Non-HDL Cholesterol (Calc) 183 (H) <130 mg/dL (calc)     Comment: For patients with diabetes plus 1 major ASCVD risk  factor, treating to a non-HDL-C goal of <100 mg/dL  (LDL-C of <84 mg/dL) is considered a therapeutic  option.     Assessment/Plan:  Mixed hyperlipidemia lipid control important in reducing the progression of atherosclerotic disease.   Renal artery stenosis (HCC) he underwent a renal artery duplex which showed elevated velocities consistent with a greater than 60% stenosis of right renal artery which appears to be a solitary kidney. With poorly controlled hypertension on 4 medications that would be an indication for intervention.  This is particularly true with a solitary kidney.  He is at high risk for progression of his hypertension and decline of his renal function with progression of dialysis without intervention.  He is still at risk of that even with intervention.  I have discussed the procedure in detail.  I discussed the natural history and pathophysiology of renal artery stenosis.  This will be scheduled in the near future at his convenience.  Renovascular hypertension With poorly controlled hypertension on 4 medications that would be an indication for intervention.  This is particularly true with a solitary kidney.  He is at high risk for progression of his hypertension and decline of his renal function with progression of dialysis without intervention.  He is still at risk of that even with intervention.  I have discussed the procedure in detail.  I discussed the natural history and pathophysiology of renal artery stenosis.  This will be scheduled in the near future at his convenience.      Festus Barren 12/12/2017, 3:50 PM   This note was created with Dragon medical transcription system.  Any errors from dictation are unintentional.

## 2017-12-12 NOTE — Assessment & Plan Note (Signed)
With poorly controlled hypertension on 4 medications that would be an indication for intervention.  This is particularly true with a solitary kidney.  He is at high risk for progression of his hypertension and decline of his renal function with progression of dialysis without intervention.  He is still at risk of that even with intervention.  I have discussed the procedure in detail.  I discussed the natural history and pathophysiology of renal artery stenosis.  This will be scheduled in the near future at his convenience.

## 2017-12-12 NOTE — Assessment & Plan Note (Signed)
he underwent a renal artery duplex which showed elevated velocities consistent with a greater than 60% stenosis of right renal artery which appears to be a solitary kidney. With poorly controlled hypertension on 4 medications that would be an indication for intervention.  This is particularly true with a solitary kidney.  He is at high risk for progression of his hypertension and decline of his renal function with progression of dialysis without intervention.  He is still at risk of that even with intervention.  I have discussed the procedure in detail.  I discussed the natural history and pathophysiology of renal artery stenosis.  This will be scheduled in the near future at his convenience.

## 2017-12-16 ENCOUNTER — Encounter (INDEPENDENT_AMBULATORY_CARE_PROVIDER_SITE_OTHER): Payer: Self-pay

## 2017-12-25 ENCOUNTER — Other Ambulatory Visit (INDEPENDENT_AMBULATORY_CARE_PROVIDER_SITE_OTHER): Payer: Medicare HMO

## 2017-12-25 DIAGNOSIS — E876 Hypokalemia: Secondary | ICD-10-CM | POA: Diagnosis not present

## 2017-12-25 DIAGNOSIS — E782 Mixed hyperlipidemia: Secondary | ICD-10-CM | POA: Diagnosis not present

## 2017-12-25 DIAGNOSIS — T502X5A Adverse effect of carbonic-anhydrase inhibitors, benzothiadiazides and other diuretics, initial encounter: Secondary | ICD-10-CM | POA: Diagnosis not present

## 2017-12-25 LAB — BASIC METABOLIC PANEL
BUN: 17 mg/dL (ref 6–23)
CALCIUM: 9.7 mg/dL (ref 8.4–10.5)
CO2: 32 meq/L (ref 19–32)
Chloride: 97 mEq/L (ref 96–112)
Creatinine, Ser: 0.95 mg/dL (ref 0.40–1.50)
GFR: 84.4 mL/min (ref >60.00)
GLUCOSE: 105 mg/dL — AB (ref 70–99)
Potassium: 3.5 mEq/L (ref 3.5–5.1)
SODIUM: 135 meq/L (ref 135–145)

## 2017-12-25 LAB — LIPID PANEL
CHOLESTEROL: 185 mg/dL (ref 0–200)
HDL: 34.6 mg/dL — AB (ref >39.00)
LDL Cholesterol: 114 mg/dL — ABNORMAL HIGH (ref 0–99)
NonHDL: 149.96
Total CHOL/HDL Ratio: 5
Triglycerides: 180 mg/dL — ABNORMAL HIGH (ref 0.0–149.0)
VLDL: 36 mg/dL (ref 0.0–40.0)

## 2017-12-26 ENCOUNTER — Other Ambulatory Visit: Payer: Medicare HMO

## 2017-12-30 ENCOUNTER — Other Ambulatory Visit (INDEPENDENT_AMBULATORY_CARE_PROVIDER_SITE_OTHER): Payer: Self-pay | Admitting: Vascular Surgery

## 2018-01-07 MED ORDER — CEFAZOLIN SODIUM-DEXTROSE 2-4 GM/100ML-% IV SOLN
2.0000 g | Freq: Once | INTRAVENOUS | Status: AC
  Administered 2018-01-08: 2 g via INTRAVENOUS

## 2018-01-08 ENCOUNTER — Encounter
Admission: RE | Disposition: A | Discharge status: Home / Self Care | Payer: Self-pay | Source: Ambulatory Visit | Attending: Vascular Surgery

## 2018-01-08 ENCOUNTER — Other Ambulatory Visit: Payer: Self-pay

## 2018-01-08 ENCOUNTER — Ambulatory Visit
Admission: RE | Admit: 2018-01-08 | Discharge: 2018-01-08 | Disposition: A | Discharge status: Home / Self Care | Payer: Medicare HMO | Source: Ambulatory Visit | Attending: Vascular Surgery | Admitting: Vascular Surgery

## 2018-01-08 DIAGNOSIS — I150 Renovascular hypertension: Secondary | ICD-10-CM | POA: Insufficient documentation

## 2018-01-08 DIAGNOSIS — Z79899 Other long term (current) drug therapy: Secondary | ICD-10-CM | POA: Insufficient documentation

## 2018-01-08 DIAGNOSIS — Q600 Renal agenesis, unilateral: Secondary | ICD-10-CM

## 2018-01-08 DIAGNOSIS — Z9889 Other specified postprocedural states: Secondary | ICD-10-CM | POA: Insufficient documentation

## 2018-01-08 DIAGNOSIS — I10 Essential (primary) hypertension: Secondary | ICD-10-CM | POA: Insufficient documentation

## 2018-01-08 DIAGNOSIS — I701 Atherosclerosis of renal artery: Secondary | ICD-10-CM

## 2018-01-08 DIAGNOSIS — Z8249 Family history of ischemic heart disease and other diseases of the circulatory system: Secondary | ICD-10-CM | POA: Diagnosis not present

## 2018-01-08 DIAGNOSIS — E782 Mixed hyperlipidemia: Secondary | ICD-10-CM | POA: Insufficient documentation

## 2018-01-08 HISTORY — PX: RENAL ANGIOGRAPHY: CATH118260

## 2018-01-08 LAB — CREATININE, SERUM
CREATININE: 0.89 mg/dL (ref 0.61–1.24)
GFR calc Af Amer: >60 mL/min (ref >60)

## 2018-01-08 LAB — BUN: BUN: 25 mg/dL — AB (ref 8–23)

## 2018-01-08 SURGERY — RENAL ANGIOGRAPHY
Anesthesia: Moderate Sedation | Laterality: Right

## 2018-01-08 MED ORDER — HEPARIN SODIUM (PORCINE) 1000 UNIT/ML IJ SOLN
INTRAMUSCULAR | Status: AC
  Filled 2018-01-08: qty 1

## 2018-01-08 MED ORDER — DIPHENHYDRAMINE HCL 50 MG/ML IJ SOLN
INTRAMUSCULAR | Status: AC
  Filled 2018-01-08: qty 1

## 2018-01-08 MED ORDER — SODIUM CHLORIDE FLUSH 0.9 % IV SOLN
INTRAVENOUS | Status: AC
  Filled 2018-01-08: qty 20

## 2018-01-08 MED ORDER — FAMOTIDINE 20 MG PO TABS: 40.0000 mg | ORAL_TABLET | ORAL | Status: DC | PRN

## 2018-01-08 MED ORDER — LIDOCAINE-EPINEPHRINE (PF) 1 %-1:200000 IJ SOLN
INTRAMUSCULAR | Status: AC
  Filled 2018-01-08: qty 30

## 2018-01-08 MED ORDER — IOPAMIDOL (ISOVUE-300) INJECTION 61%
INTRAVENOUS | Status: DC | PRN
  Administered 2018-01-08: 25 mL via INTRA_ARTERIAL

## 2018-01-08 MED ORDER — ONDANSETRON HCL 4 MG/2ML IJ SOLN: 4.0000 mg | Freq: Four times a day (QID) | INTRAMUSCULAR | Status: DC | PRN

## 2018-01-08 MED ORDER — SODIUM CHLORIDE 0.9 % IV SOLN
INTRAVENOUS | Status: DC
  Administered 2018-01-08: 08:00:00 via INTRAVENOUS

## 2018-01-08 MED ORDER — METHYLPREDNISOLONE SODIUM SUCC 125 MG IJ SOLR: 125.0000 mg | INTRAMUSCULAR | Status: DC | PRN

## 2018-01-08 MED ORDER — FENTANYL CITRATE (PF) 100 MCG/2ML IJ SOLN
INTRAMUSCULAR | Status: AC
  Filled 2018-01-08: qty 2

## 2018-01-08 MED ORDER — CEFAZOLIN SODIUM-DEXTROSE 2-4 GM/100ML-% IV SOLN
INTRAVENOUS | Status: AC
  Filled 2018-01-08: qty 100

## 2018-01-08 MED ORDER — DIPHENHYDRAMINE HCL 50 MG/ML IJ SOLN
INTRAMUSCULAR | Status: DC | PRN
  Administered 2018-01-08: 25 mg via INTRAVENOUS

## 2018-01-08 MED ORDER — HEPARIN (PORCINE) IN NACL 1000-0.9 UT/500ML-% IV SOLN
INTRAVENOUS | Status: AC
  Filled 2018-01-08: qty 1000

## 2018-01-08 MED ORDER — MIDAZOLAM HCL 2 MG/2ML IJ SOLN
INTRAMUSCULAR | Status: DC | PRN
  Administered 2018-01-08 (×2): 1 mg via INTRAVENOUS
  Administered 2018-01-08: 2 mg via INTRAVENOUS

## 2018-01-08 MED ORDER — MIDAZOLAM HCL 5 MG/5ML IJ SOLN
INTRAMUSCULAR | Status: AC
  Filled 2018-01-08: qty 5

## 2018-01-08 MED ORDER — FENTANYL CITRATE (PF) 100 MCG/2ML IJ SOLN
INTRAMUSCULAR | Status: DC | PRN
  Administered 2018-01-08 (×3): 50 ug via INTRAVENOUS

## 2018-01-08 MED ORDER — HYDROMORPHONE HCL 1 MG/ML IJ SOLN: 1.0000 mg | Freq: Once | INTRAMUSCULAR | Status: DC | PRN

## 2018-01-08 SURGICAL SUPPLY — 10 items
CATH BEACON 5 .035 65 C2 TIP (CATHETERS) ×2 IMPLANT
CATH PIG 70CM (CATHETERS) ×2 IMPLANT
DEVICE STARCLOSE SE CLOSURE (Vascular Products) ×2 IMPLANT
DEVICE TORQUE .025-.038 (MISCELLANEOUS) ×2 IMPLANT
GLIDEWIRE STIFF .35X180X3 HYDR (WIRE) ×2 IMPLANT
PACK ANGIOGRAPHY (CUSTOM PROCEDURE TRAY) ×2 IMPLANT
SHEATH BRITE TIP 5FRX11 (SHEATH) ×2 IMPLANT
SYR MEDRAD MARK V 150ML (SYRINGE) ×2 IMPLANT
TUBING CONTRAST HIGH PRESS 72 (TUBING) ×2 IMPLANT
WIRE J 3MM .035X145CM (WIRE) ×2 IMPLANT

## 2018-01-08 NOTE — Discharge Instructions (Signed)
Femoral Site Care °Refer to this sheet in the next few weeks. These instructions provide you with information about caring for yourself after your procedure. Your health care provider may also give you more specific instructions. Your treatment has been planned according to current medical practices, but problems sometimes occur. Call your health care provider if you have any problems or questions after your procedure. °What can I expect after the procedure? °After your procedure, it is typical to have the following: °· Bruising at the site that usually fades within 1-2 weeks. °· Blood collecting in the tissue (hematoma) that may be painful to the touch. It should usually decrease in size and tenderness within 1-2 weeks. ° °Follow these instructions at home: °· Take medicines only as directed by your health care provider. °· You may shower 24-48 hours after the procedure or as directed by your health care provider. Remove the bandage (dressing) and gently wash the site with plain soap and water. Pat the area dry with a clean towel. Do not rub the site, because this may cause bleeding. °· Do not take baths, swim, or use a hot tub until your health care provider approves. °· Check your insertion site every day for redness, swelling, or drainage. °· Do not apply powder or lotion to the site. °· Limit use of stairs to twice a day for the first 2-3 days or as directed by your health care provider. °· Do not squat for the first 2-3 days or as directed by your health care provider. °· Do not lift over 10 lb (4.5 kg) for 5 days after your procedure or as directed by your health care provider. °· Ask your health care provider when it is okay to: °? Return to work or school. °? Resume usual physical activities or sports. °? Resume sexual activity. °· Do not drive home if you are discharged the same day as the procedure. Have someone else drive you. °· You may drive 24 hours after the procedure unless otherwise instructed by  your health care provider. °· Do not operate machinery or power tools for 24 hours after the procedure or as directed by your health care provider. °· If your procedure was done as an outpatient procedure, which means that you went home the same day as your procedure, a responsible adult should be with you for the first 24 hours after you arrive home. °· Keep all follow-up visits as directed by your health care provider. This is important. °Contact a health care provider if: °· You have a fever. °· You have chills. °· You have increased bleeding from the site. Hold pressure on the site. °Get help right away if: °· You have unusual pain at the site. °· You have redness, warmth, or swelling at the site. °· You have drainage (other than a small amount of blood on the dressing) from the site. °· The site is bleeding, and the bleeding does not stop after 30 minutes of holding steady pressure on the site. °· Your leg or foot becomes pale, cool, tingly, or numb. °This information is not intended to replace advice given to you by your health care provider. Make sure you discuss any questions you have with your health care provider. °Document Released: 11/12/2013 Document Revised: 08/17/2015 Document Reviewed: 09/28/2013 °Elsevier Interactive Patient Education © 2018 Else[employer] Inc. °Moderate Conscious Sedation, Adult, Care After °These instructions provide you with information about caring for yourself after your procedure. Your health care provider may also give you more   specific instructions. Your treatment has been planned according to current medical practices, but problems sometimes occur. Call your health care provider if you have any problems or questions after your procedure. °What can I expect after the procedure? °After your procedure, it is common: °· To feel sleepy for several hours. °· To feel clumsy and have poor balance for several hours. °· To have poor judgment for several hours. °· To vomit if you eat too  soon. ° °Follow these instructions at home: °For at least 24 hours after the procedure: ° °· Do not: °? Participate in activities where you could fall or become injured. °? Drive. °? Use heavy machinery. °? Drink alcohol. °? Take sleeping pills or medicines that cause drowsiness. °? Make important decisions or sign legal documents. °? Take care of children on your own. °· Rest. °Eating and drinking °· Follow the diet recommended by your health care provider. °· If you vomit: °? Drink water, juice, or soup when you can drink without vomiting. °? Make sure you have little or no nausea before eating solid foods. °General instructions °· Have a responsible adult stay with you until you are awake and alert. °· Take over-the-counter and prescription medicines only as told by your health care provider. °· If you smoke, do not smoke without supervision. °· Keep all follow-up visits as told by your health care provider. This is important. °Contact a health care provider if: °· You keep feeling nauseous or you keep vomiting. °· You feel light-headed. °· You develop a rash. °· You have a fever. °Get help right away if: °· You have trouble breathing. °This information is not intended to replace advice given to you by your health care provider. Make sure you discuss any questions you have with your health care provider. °Document Released: 12/30/2012 Document Revised: 08/14/2015 Document Reviewed: 07/01/2015 °Elsevier Interactive Patient Education © 2018 Elsevier Inc. ° °

## 2018-01-08 NOTE — H&P (Signed)
[facility] History & Physical Update  The patient was interviewed and re-examined.  The patient's previous History and Physical has been reviewed and is unchanged.  There is no change in the plan of care. We plan to proceed with the scheduled procedure.  Festus Barren, MD  01/08/2018, 8:04 AM

## 2018-01-08 NOTE — Op Note (Signed)
[facility] Percutaneous Study/Intervention Procedural Note    Surgeon(s): [employer]  Assistants: None  Pre-operative Diagnosis: Right renal artery stenosis, solitary right kidney, renovascular hypertension  Post-operative diagnosis:Hypertension without significant right renal artery stenosis  Procedure(s) Performed: 1. Ultrasound guidance for vascular access right femoral artery 2. Catheter placement into right renal artery from right femoral approach 3. Aortogram and selective right renal angiogram 4. StarClose closure device right femoral artery  Contrast: 25 cc  EBL: 5  Fluoro Time: 1.5 minutes  Moderate conscious sedation: Approximately 25 minutes with 4 mg of Versed and 150 Mcg of Fentanyl  Indications: The patient is a 67 year old male with worsening severe hypertension despite multiple medications. The patient has suboptimal blood pressure control despite multiple antihypertensives and a noninvasive study suggesting a solitary right kidney with hemodynamically significant right renal artery stenosis. Given the clinical scenario and the noninvasive findings, angiogram is indicated for further evaluation of her renal artery and potential treatment. Risks and benefits are discussed and informed consent is obtained.  Procedure: The patient was identified and appropriate procedural time out was performed. The patient was then placed supine on the table and prepped and draped in the usual sterile fashion.Moderate conscious sedation was administered with a face to face encounter with the patient throughout the procedure with my supervision of the RN administering medicines and monitoring the patients vital signs and mental status throughout from the start of the procedure until the patient was taken to the recovery room  Ultrasound was used to evaluate the right common femoral artery. It was  patent . A digital ultrasound image was acquired. A Seldinger needle was used to access the right common femoral artery under direct ultrasound guidance and a permanent image was performed. A 0.035 J wire was advanced without resistance and a 5Fr sheath was placed. Pigtail catheter was placed into the aorta at the L1 level and an AP aortogram was performed.  A C2 catheter was used to selectively cannulate the right renal artery to perform selective images of the right renal artery as well. The aorta and iliac arteries are widely patent.  The SMA and celiac artery appeared to have good flow although this was only done in the AP projection.  The right kidney was a solitary kidney.  Both on aortogram and then selective imaging, no significant right renal artery stenosis was identified.  No intervention was required.   Oblique arteriogram was performed of the right femoral artery and StarClose closure device was deployed in the usual fashion with excellent hemostatic result. The patient was taken to the recovery room in stable condition having tolerated the procedure well.  Findings:  Aortogram/Renal Arteries:The aorta and iliac arteries are widely patent.  The SMA and celiac artery appeared to have good flow although this was only done in the AP projection.  The right kidney was a solitary kidney.  Both on aortogram and then selective imaging, no significant right renal artery stenosis was identified.   Condition:  Stable  Complications: None   Frederick Pace 01/08/2018 8:50 AM  This note was created with Dragon Medical transcription system. Any errors in dictation are purely unintentional.

## 2018-02-02 ENCOUNTER — Other Ambulatory Visit: Payer: Self-pay | Admitting: Internal Medicine

## 2018-02-10 ENCOUNTER — Other Ambulatory Visit: Payer: Self-pay

## 2018-02-10 MED ORDER — LOSARTAN POTASSIUM 100 MG PO TABS: 100.0000 mg | ORAL_TABLET | Freq: Every day | ORAL | 0 refills | Status: DC

## 2018-02-13 ENCOUNTER — Encounter (INDEPENDENT_AMBULATORY_CARE_PROVIDER_SITE_OTHER): Payer: Self-pay | Admitting: Vascular Surgery

## 2018-02-13 ENCOUNTER — Ambulatory Visit (INDEPENDENT_AMBULATORY_CARE_PROVIDER_SITE_OTHER): Payer: Medicare HMO | Admitting: Vascular Surgery

## 2018-02-13 VITALS — BP 122/77 | HR 73 | Resp 16 | Ht 64.0 in | Wt 185.0 lb

## 2018-02-13 DIAGNOSIS — E782 Mixed hyperlipidemia: Secondary | ICD-10-CM

## 2018-02-13 DIAGNOSIS — IMO0002 Reserved for concepts with insufficient information to code with codable children: Secondary | ICD-10-CM | POA: Insufficient documentation

## 2018-02-13 DIAGNOSIS — I10 Essential (primary) hypertension: Secondary | ICD-10-CM

## 2018-02-13 DIAGNOSIS — Q600 Renal agenesis, unilateral: Secondary | ICD-10-CM | POA: Diagnosis not present

## 2018-02-13 NOTE — Progress Notes (Signed)
MRN : [id number]  Frederick Pace. is a 67 y.o. (23-Jun-1950) male who presents with chief complaint of  Chief Complaint  Patient presents with  . Follow-up    5 week [facility] follow up  .  History of Present Illness: Patient returns today in follow up of his renal artery angiogram performed about a month ago.  He was found to have no significant right renal artery stenosis in his solitary right kidney.  His blood pressure control has gotten a little better and is normal today.  He reports no periprocedural complications.  His access site is well-healed.  Current Outpatient Medications  Medication Sig Dispense Refill  . amLODipine (NORVASC) 10 MG tablet TAKE ONE TABLET BY MOUTH EVERY DAY 30 tablet 5  . doxycycline (VIBRA-TABS) 100 MG tablet Take 1 tablet (100 mg total) by mouth 2 (two) times daily. 20 tablet 0  . losartan (COZAAR) 100 MG tablet Take 1 tablet (100 mg total) by mouth daily. 90 tablet 0  . Red Yeast Rice Extract 600 MG CAPS Take 1 capsule by mouth 2 (two) times daily.     . potassium chloride SA (K-DUR,KLOR-CON) 20 MEQ tablet Take 1 tablet (20 mEq total) by mouth daily. (Patient not taking: Reported on 01/08/2018) 5 tablet 0  . triamterene-hydrochlorothiazide (MAXZIDE-25) 37.5-25 MG tablet Take 1 tablet by mouth daily. (Patient not taking: Reported on 01/08/2018) 90 tablet 3   No current facility-administered medications for this visit.     Past Medical History:  Diagnosis Date  . HTN (hypertension)   . Other and unspecified hyperlipidemia     Past Surgical History:  Procedure Laterality Date  . ANTERIOR CRUCIATE LIGAMENT REPAIR Left 2004  . RENAL ANGIOGRAPHY Right 01/08/2018   Procedure: RENAL ANGIOGRAPHY;  Surgeon: Algernon Huxley, MD;  Location: [facility] [facility];  Service: Cardiovascular;  Laterality: Right;    Family History  Problem Relation Age of Onset  . Dementia Mother   . Heart disease Father 29       CABG  . Mitral valve prolapse Father   .  Aortic aneurysm Father   . Hyperlipidemia Unknown        family Hx  . Heart disease Paternal Uncle        Valve replacements     Social History Social History   Tobacco Use  . Smoking status: Never Smoker  . Smokeless tobacco: Never Used  Substance Use Topics  . Alcohol use: Yes    Alcohol/week: 0.0 standard drinks    Comment: 1 drink every few months  . Drug use: No    Comment: Refused to answer     No Known Allergies     REVIEW OF SYSTEMS (Negative unless checked)  Constitutional: []Weight loss  []Fever  []Chills Cardiac: []Chest pain   []Chest pressure   []Palpitations   []Shortness of breath when laying flat   []Shortness of breath at rest   []Shortness of breath with exertion. Vascular:  []Pain in legs with walking   []Pain in legs at rest   []Pain in legs when laying flat   []Claudication   []Pain in feet when walking  []Pain in feet at rest  []Pain in feet when laying flat   []History of DVT   []Phlebitis   []Swelling in legs   []Varicose veins   []Non-healing ulcers Pulmonary:   []Uses home oxygen   []Productive cough   []Hemoptysis   []Wheeze  []COPD   []Asthma Neurologic:  []Dizziness  []  Blackouts   []Seizures   []History of stroke   []History of TIA  []Aphasia   []Temporary blindness   []Dysphagia   []Weakness or numbness in arms   []Weakness or numbness in legs Musculoskeletal:  [x]Arthritis   []Joint swelling   []Joint pain   []Low back pain Hematologic:  []Easy bruising  []Easy bleeding   []Hypercoagulable state   []Anemic  []Hepatitis Gastrointestinal:  []Blood in stool   []Vomiting blood  []Gastroesophageal reflux/heartburn   []Abdominal pain Genitourinary:  []Chronic kidney disease   []Difficult urination  []Frequent urination  []Burning with urination   []Hematuria Skin:  []Rashes   []Ulcers   []Wounds Psychological:  []History of anxiety   [] History of major depression.    Physical Examination  BP 122/77 (BP Location: Right Arm)    Pulse 73   Resp 16   Ht 5' 4" (1.626 m)   Wt 185 lb (83.9 kg)   BMI 31.76 kg/m  Gen:  WD/WN, NAD Head: [place]/AT, No temporalis wasting. Ear/Nose/Throat: Hearing grossly intact, nares w/o erythema or drainage Eyes: Conjunctiva clear. Sclera non-icteric Neck: Supple.  Trachea midline Pulmonary:  Good air movement, no use of accessory muscles.  Cardiac: RRR, no JVD Vascular:  Vessel Right Left  Radial Palpable Palpable                                    Musculoskeletal: M/S 5/5 throughout.  No deformity or atrophy. No edema. Neurologic: Sensation grossly intact in extremities.  Symmetrical.  Speech is fluent.  Psychiatric: Judgment intact, Mood & affect appropriate for pt's clinical situation. Dermatologic: No rashes or ulcers noted.  Access site well-healed       Labs Recent Results (from the past 2160 hour(s))  Comprehensive metabolic panel     Status: Abnormal   Collection Time: 11/21/17  3:59 PM  Result Value Ref Range   Glucose, Bld 93 65 - 99 mg/dL    Comment: .            Fasting reference interval .    BUN 15 7 - 25 mg/dL   Creat 1.00 0.70 - 1.25 mg/dL    Comment: For patients >59 years of age, the reference limit for Creatinine is approximately 13% higher for people identified as African-American. .    BUN/Creatinine Ratio NOT APPLICABLE 6 - 22 (calc)   Sodium 135 135 - 146 mmol/L   Potassium 3.2 (L) 3.5 - 5.3 mmol/L   Chloride 97 (L) 98 - 110 mmol/L   CO2 24 20 - 32 mmol/L   Calcium 9.6 8.6 - 10.3 mg/dL   Total Protein 7.2 6.1 - 8.1 g/dL   Albumin 4.7 3.6 - 5.1 g/dL   Globulin 2.5 1.9 - 3.7 g/dL (calc)   AG Ratio 1.9 1.0 - 2.5 (calc)   Total Bilirubin 0.6 0.2 - 1.2 mg/dL   Alkaline phosphatase (APISO) 67 40 - 115 U/L   AST 16 10 - 35 U/L   ALT 23 9 - 46 U/L  PSA     Status: None   Collection Time: 11/21/17  3:59 PM  Result Value Ref Range   PSA 0.4 < OR = 4.0 ng/mL    Comment: The total PSA value from this assay system is  standardized  against the WHO standard. The test  result will be approximately 20% lower when compared  to the equimolar-standardized total PSA (Beckman  Coulter). Comparison  of serial PSA results should be  interpreted with this fact in mind. . This test was performed using the Siemens  chemiluminescent method. Values obtained from  different assay methods cannot be used interchangeably. PSA levels, regardless of value, should not be interpreted as absolute evidence of the presence or absence of disease.   Lipid panel     Status: Abnormal   Collection Time: 11/21/17  3:59 PM  Result Value Ref Range   Cholesterol 216 (H) <200 mg/dL   HDL 33 (L) >40 mg/dL   Triglycerides 226 (H) <150 mg/dL    Comment: . If a non-fasting specimen was collected, consider repeat triglyceride testing on a fasting specimen if clinically indicated.  Yates Decamp et al. J. of Clin. Lipidol. 3825;0:539-767. [name]    LDL Cholesterol (Calc) 147 (H) mg/dL (calc)    Comment: Reference range: <100 . Desirable range <100 mg/dL for primary prevention;   <70 mg/dL for patients with CHD or diabetic patients  with > or = 2 CHD risk factors. [name] LDL-C is now calculated using the Martin-Hopkins  calculation, which is a validated novel method providing  better accuracy than the Friedewald equation in the  estimation of LDL-C.  Cresenciano Genre et al. Annamaria Helling. 3419;379(02): 2061-2068  (http://education.[facility].com/faq/FAQ164)    Total CHOL/HDL Ratio 6.5 (H) <5.0 (calc)   Non-HDL Cholesterol (Calc) 183 (H) <130 mg/dL (calc)    Comment: For patients with diabetes plus 1 major ASCVD risk  factor, treating to a non-HDL-C goal of <100 mg/dL  (LDL-C of <70 mg/dL) is considered a therapeutic  option.   Lipid panel     Status: Abnormal   Collection Time: 12/25/17  8:13 AM  Result Value Ref Range   Cholesterol 185 0 - 200 mg/dL    Comment: ATP III Classification       Desirable:  < 200 mg/dL               Borderline High:  200 - 239 mg/dL           High:  > = 240 mg/dL   Triglycerides 180.0 (H) 0.0 - 149.0 mg/dL    Comment: Normal:  <150 mg/dLBorderline High:  150 - 199 mg/dL   HDL 34.60 (L) >39.00 mg/dL   VLDL 36.0 0.0 - 40.0 mg/dL   LDL Cholesterol 114 (H) 0 - 99 mg/dL   Total CHOL/HDL Ratio 5     Comment:                Men          Women1/2 Average Risk     3.4          3.3Average Risk          5.0          4.42X Average Risk          9.6          7.13X Average Risk          15.0          11.0                       NonHDL 149.96     Comment: NOTE:  Non-HDL goal should be 30 mg/dL higher than patient's LDL goal (i.e. LDL goal of < 70 mg/dL, would have non-HDL goal of < 100 mg/dL)  Basic metabolic panel     Status: Abnormal   Collection Time: 12/25/17  8:13 AM  Result Value  Ref Range   Sodium 135 135 - 145 mEq/L   Potassium 3.5 3.5 - 5.1 mEq/L   Chloride 97 96 - 112 mEq/L   CO2 32 19 - 32 mEq/L   Glucose, Bld 105 (H) 70 - 99 mg/dL   BUN 17 6 - 23 mg/dL   Creatinine, Ser 0.95 0.40 - 1.50 mg/dL   Calcium 9.7 8.4 - 10.5 mg/dL   GFR 84.40 >60.00 mL/min  BUN     Status: Abnormal   Collection Time: 01/08/18  7:28 AM  Result Value Ref Range   BUN 25 (H) 8 - 23 mg/dL    Comment: Performed at [facility], [address] [place], [place] [address]  Creatinine, serum     Status: None   Collection Time: 01/08/18  7:28 AM  Result Value Ref Range   Creatinine, Ser 0.89 0.61 - 1.24 mg/dL   GFR calc non Af Amer >60 >60 mL/min   GFR calc Af Amer >60 >60 mL/min    Comment: (NOTE) The eGFR has been calculated using the CKD EPI equation. This calculation has not been validated in all clinical situations. eGFR's persistently <60 mL/min signify possible Chronic Kidney Disease. Performed at [facility], [address], [place], [place] [address]     Radiology No results found.  Assessment/Plan Mixed hyperlipidemia lipid control important in reducing the progression of atherosclerotic  disease.  Essential hypertension Blood pressure control is improved.  This is important in reducing progression of atherosclerotic disease.  Solitary kidney The patient was found to have no hemodynamically significant renal artery stenosis in his solitary kidney on arteriogram.  I think a follow-up duplex every year or 2 would be reasonable given the fact that this is a solitary kidney and if it has significant issues, he is at high risk of renal failure.    Leotis Pain, MD  02/13/2018 9:19 AM    This note was created with Dragon medical transcription system.  Any errors from dictation are purely unintentional

## 2018-02-13 NOTE — Assessment & Plan Note (Signed)
Blood pressure control is improved.  This is important in reducing progression of atherosclerotic disease.

## 2018-02-13 NOTE — Assessment & Plan Note (Signed)
The patient was found to have no hemodynamically significant renal artery stenosis in his solitary kidney on arteriogram.  I think a follow-up duplex every year or 2 would be reasonable given the fact that this is a solitary kidney and if it has significant issues, he is at high risk of renal failure.

## 2018-04-17 ENCOUNTER — Other Ambulatory Visit: Payer: Self-pay | Admitting: Internal Medicine

## 2018-06-05 ENCOUNTER — Ambulatory Visit (INDEPENDENT_AMBULATORY_CARE_PROVIDER_SITE_OTHER): Payer: Medicare Other

## 2018-06-05 ENCOUNTER — Other Ambulatory Visit: Payer: Self-pay

## 2018-06-05 VITALS — BP 122/70 | HR 79 | Temp 98.1°F | Resp 15 | Ht 65.0 in | Wt 191.8 lb

## 2018-06-05 DIAGNOSIS — Z0000 Encounter for general adult medical examination without abnormal findings: Secondary | ICD-10-CM

## 2018-06-05 NOTE — Patient Instructions (Addendum)
  Mr. Coombe , Thank you for taking time to come for your Medicare Wellness Visit. I appreciate your ongoing commitment to your health goals. Please review the following plan we discussed and let me know if I can assist you in the future.   These are the goals we discussed: Goals    . DIET - INCREASE LEAN PROTEINS     Low carb foods Healthy snack Portion control       This is a list of the screening recommended for you and due dates:  Health Maintenance  Topic Date Due  . Pneumonia vaccines (1 of 2 - PCV13) 05/03/2017  . Flu Shot  10/23/2017  . Colon Cancer Screening  12/18/2018  . Tetanus Vaccine  12/13/2024  .  Hepatitis C: One time screening is recommended by Center for Disease Control  (CDC) for  adults born from [birth year] through 1963.   Completed

## 2018-06-05 NOTE — Progress Notes (Signed)
Subjective:   Frederick Pace. is a 68 y.o. male who presents for an Initial Medicare Annual Wellness Visit.  Review of Systems  No ROS.  Medicare Wellness Visit. Additional risk factors are reflected in the social history. Cardiac Risk Factors include: advanced age (>52men, >20 women);hypertension;male gender    Objective:    Today's Vitals   06/05/18 1215  BP: 122/70  Pulse: 79  Resp: 15  Temp: 98.1 F (36.7 C)  TempSrc: Oral  SpO2: 97%  Weight: 191 lb 12.8 oz (87 kg)  Height: 5\' 5"  (1.651 m)   Body mass index is 31.92 kg/m.  Advanced Directives 06/05/2018  Does Patient Have a Medical Advance Directive? No  Would patient like information on creating a medical advance directive? No - Patient declined    Current Medications (verified) Outpatient Encounter Medications as of 06/05/2018  Medication Sig  . amLODipine (NORVASC) 10 MG tablet TAKE ONE TABLET BY MOUTH EVERY DAY  . doxycycline (VIBRA-TABS) 100 MG tablet Take 1 tablet (100 mg total) by mouth 2 (two) times daily.  [name] losartan (COZAAR) 100 MG tablet Take 1 tablet (100 mg total) by mouth daily.  . potassium chloride SA (K-DUR,KLOR-CON) 20 MEQ tablet Take 1 tablet (20 mEq total) by mouth daily. (Patient not taking: Reported on 01/08/2018)  . Red Yeast Rice Extract 600 MG CAPS Take 1 capsule by mouth 2 (two) times daily.   [name] triamterene-hydrochlorothiazide (MAXZIDE-25) 37.5-25 MG tablet Take 1 tablet by mouth daily. (Patient not taking: Reported on 01/08/2018)   No facility-administered encounter medications on file as of 06/05/2018.     Allergies (verified) Patient has no known allergies.   History: Past Medical History:  Diagnosis Date  . HTN (hypertension)   . Other and unspecified hyperlipidemia    Past Surgical History:  Procedure Laterality Date  . ANTERIOR CRUCIATE LIGAMENT REPAIR Left 2004  . RENAL ANGIOGRAPHY Right 01/08/2018   Procedure: RENAL ANGIOGRAPHY;  Surgeon: Annice Needy, MD;  Location:  [facility];  Service: Cardiovascular;  Laterality: Right;   Family History  Problem Relation Age of Onset  . Dementia Mother   . Heart disease Father 106       CABG  . Mitral valve prolapse Father   . Aortic aneurysm Father   . Hyperlipidemia Other        family Hx  . Heart disease Paternal Uncle        Valve replacements   Social History   Socioeconomic History  . Marital status: Single    Spouse name: Not on file  . Number of children: Not on file  . Years of education: Not on file  . Highest education level: Not on file  Occupational History  . Not on file  Social Needs  . Financial resource strain: Not hard at all  . Food insecurity:    Worry: Never true    Inability: Never true  . Transportation needs:    Medical: No    Non-medical: No  Tobacco Use  . Smoking status: Never Smoker  . Smokeless tobacco: Never Used  Substance and Sexual Activity  . Alcohol use: Yes    Alcohol/week: 0.0 standard drinks    Comment: 1 drink every few months  . Drug use: No    Comment: Refused to answer  . Sexual activity: Not on file  Lifestyle  . Physical activity:    Days per week: 0 days    Minutes per session: Not on file  .  Stress: Not at all  Relationships  . Social connections:    Talks on phone: Not on file    Gets together: Not on file    Attends religious service: Not on file    Active member of club or organization: Not on file    Attends meetings of clubs or organizations: Not on file    Relationship status: Not on file  Other Topics Concern  . Not on file  Social History Narrative  . Not on file   Tobacco Counseling Counseling given: Not Answered   Clinical Intake:  Pre-visit preparation completed: Yes        Diabetes: No  How often do you need to have someone help you when you read instructions, pamphlets, or other written materials from your doctor or pharmacy?: 1 - Never  Interpreter Needed?: No     Activities of Daily Living In  your present state of health, do you have any difficulty performing the following activities: 06/05/2018  Hearing? N  Vision? N  Difficulty concentrating or making decisions? N  Walking or climbing stairs? Y  Dressing or bathing? N  Doing errands, shopping? N  Preparing Food and eating ? N  Using the Toilet? N  In the past six months, have you accidently leaked urine? N  Do you have problems with loss of bowel control? N  Managing your Medications? N  Managing your Finances? N  Housekeeping or managing your Housekeeping? N  Some recent data might be hidden     Immunizations and Health Maintenance Immunization History  Administered Date(s) Administered  . Tdap 12/14/2014   Health Maintenance Due  Topic Date Due  . PNA vac Low Risk Adult (1 of 2 - PCV13) 05/03/2017  . INFLUENZA VACCINE  10/23/2017    Patient Care Team: Sherlene Shams, MD as PCP - General (Internal Medicine)  Indicate any recent Medical Services you may have received from other than [facility] providers in the past year (date may be approximate).    Assessment:   This is a routine wellness examination for [facility].  Health Screenings  Colonoscopy -12/17/08 PSA -11/21/17 Bone Density  Glaucoma -none Hearing -demonstrates normal hearing during conversation Cholesterol - 12/25/17 (185)  Social  Alcohol intake -yes Smoking history- never Smokers in home? none Illicit drug use? none Exercise -yardwork Diet -regular  Safety  Patient feels safe at home.  Patient does have smoke detectors at home  Patient does wear sunscreen or protective clothing when in direct sunlight. Patient does wear seat belt when driving or riding with others.   Activities of Daily Living Patient can do their own household chores. Denies needing assistance with: driving, feeding themselves, getting from bed to chair, getting to the toilet, bathing/showering, dressing, managing money, climbing flight of stairs, or preparing meals.    Depression Screen Patient denies losing interest in daily life, feeling hopeless, or crying easily over simple problems.   Fall Screen Patient denies being afraid of falling or falling in the last year.   Memory Screen Patient denies problems with memory, misplacing items, and is able to balance checkbook/bank accounts.  Patient is alert, normal appearance, oriented to person/place/and time. Correctly identified the president of the [place], recall of 2/3 objects, and performing simple calculations.  Patient displays appropriate judgement and can read correct time from watch face.   Immunizations The following Immunizations are up to date: Influenza, shingles, pneumonia, and tetanus.   Other Providers Patient Care Team: Sherlene Shams, MD as PCP -  General (Internal Medicine)  Hearing/Vision screen  Visual Acuity Screening   Right eye Left eye Both eyes  Without correction:   20/20  With correction:     Hearing Screening Comments: Patient is able to hear conversational tones without difficulty.  No issues reported.   Dietary issues and exercise activities discussed: Current Exercise Habits: Home exercise routine, Type of exercise: walking, Intensity: Mild  Goals    . DIET - INCREASE LEAN PROTEINS     Low carb foods Healthy snack Portion control      Depression Screen PHQ 2/9 Scores 06/05/2018 11/21/2017 09/15/2016  PHQ - 2 Score 0 0 0  PHQ- 9 Score - 0 -    Fall Risk Fall Risk  06/05/2018 11/21/2017 09/15/2016  Falls in the past year? 0 No No   Cognitive Function:     6CIT Screen 06/05/2018  What Year? 0 points  What month? 0 points  What time? 0 points  Count back from 20 0 points  Months in reverse 0 points  Repeat phrase 0 points  Total Score 0    Screening Tests Health Maintenance  Topic Date Due  . PNA vac Low Risk Adult (1 of 2 - PCV13) 05/03/2017  . INFLUENZA VACCINE  10/23/2017  . COLONOSCOPY  12/18/2018  . TETANUS/TDAP  12/13/2024  . Hepatitis C  Screening  Completed      Plan:   End of life planning; Advanced aging; Advanced directives discussed.  No HCPOA/Living Will.  Additional information declined at this time.  I have personally reviewed and noted the following in the patient's chart:   . Medical and social history . Use of alcohol, tobacco or illicit drugs  . Current medications and supplements . Functional ability and status . Nutritional status . Physical activity . Advanced directives . List of other physicians . Hospitalizations, surgeries, and ER visits in previous 12 months . Vitals . Screenings to include cognitive, depression, and falls . Referrals and appointments  In addition, I have reviewed and discussed with patient certain preventive protocols, quality metrics, and best practice recommendations. A written personalized care plan for preventive services as well as general preventive health recommendations were provided to patient.     OBrien-Blaney, Lynton Crescenzo L, LPN   1/61/0960     I have reviewed the above information and agree with above.   Duncan Dull, MD

## 2018-06-09 ENCOUNTER — Other Ambulatory Visit: Payer: Self-pay | Admitting: Internal Medicine

## 2018-06-29 ENCOUNTER — Telehealth: Payer: Self-pay | Admitting: Internal Medicine

## 2018-06-29 DIAGNOSIS — E782 Mixed hyperlipidemia: Secondary | ICD-10-CM

## 2018-06-29 DIAGNOSIS — I10 Essential (primary) hypertension: Secondary | ICD-10-CM

## 2018-06-29 NOTE — Telephone Encounter (Signed)
The patient is requesting labs and a follow up visit. When and how would you like the patient scheduled .

## 2018-07-01 NOTE — Telephone Encounter (Signed)
Spoke with pt and he stated that he does not want to do a virtual or telephone visit at this time. He stated that all he wants to do is get his lab work done. Is it okay for pt to just come in for lab work and schedule a follow up with you at a later date?

## 2018-07-01 NOTE — Telephone Encounter (Signed)
Yes,  If not ordered he needs cmet and lipid panel

## 2018-07-06 NOTE — Telephone Encounter (Signed)
LMTCB. [facility] may speak with pt. Need to schedule pt for a fasting lab appt. Labs have been ordered.

## 2018-07-06 NOTE — Telephone Encounter (Signed)
Pt called and left message of return call on [facility] mailbox.

## 2018-07-10 NOTE — Telephone Encounter (Signed)
LMTCB. Need to scheduled pt for a fasting lab appt. [facility] may speak with pt.

## 2018-07-14 NOTE — Telephone Encounter (Signed)
Pt is scheduled for lab work on 07/15/2018.

## 2018-07-15 ENCOUNTER — Other Ambulatory Visit: Payer: Self-pay

## 2018-07-15 ENCOUNTER — Other Ambulatory Visit (INDEPENDENT_AMBULATORY_CARE_PROVIDER_SITE_OTHER): Payer: Medicare Other

## 2018-07-15 DIAGNOSIS — E782 Mixed hyperlipidemia: Secondary | ICD-10-CM

## 2018-07-15 DIAGNOSIS — I10 Essential (primary) hypertension: Secondary | ICD-10-CM | POA: Diagnosis not present

## 2018-07-15 LAB — COMPREHENSIVE METABOLIC PANEL
ALT: 21 U/L (ref 0–53)
AST: 16 U/L (ref 0–37)
Albumin: 4.7 g/dL (ref 3.5–5.2)
Alkaline Phosphatase: 54 U/L (ref 39–117)
BUN: 23 mg/dL (ref 6–23)
CO2: 28 mEq/L (ref 19–32)
Calcium: 9.6 mg/dL (ref 8.4–10.5)
Chloride: 98 mEq/L (ref 96–112)
Creatinine, Ser: 1 mg/dL (ref 0.40–1.50)
GFR: 74.71 mL/min (ref >60.00)
Glucose, Bld: 107 mg/dL — ABNORMAL HIGH (ref 70–99)
Potassium: 3.7 mEq/L (ref 3.5–5.1)
Sodium: 136 mEq/L (ref 135–145)
Total Bilirubin: 0.5 mg/dL (ref 0.2–1.2)
Total Protein: 7.9 g/dL (ref 6.0–8.3)

## 2018-07-15 LAB — LIPID PANEL
Cholesterol: 213 mg/dL — ABNORMAL HIGH (ref 0–200)
HDL: 42.8 mg/dL (ref >39.00)
NonHDL: 170.35
Total CHOL/HDL Ratio: 5
Triglycerides: 266 mg/dL — ABNORMAL HIGH (ref 0.0–149.0)
VLDL: 53.2 mg/dL — ABNORMAL HIGH (ref 0.0–40.0)

## 2018-07-15 LAB — LDL CHOLESTEROL, DIRECT: Direct LDL: 135 mg/dL

## 2018-08-10 ENCOUNTER — Other Ambulatory Visit: Payer: Self-pay

## 2018-08-10 MED ORDER — LOSARTAN POTASSIUM 100 MG PO TABS: 100.0000 mg | ORAL_TABLET | Freq: Every day | ORAL | 1 refills | Status: DC

## 2018-08-17 ENCOUNTER — Other Ambulatory Visit: Payer: Self-pay | Admitting: Internal Medicine

## 2018-08-20 ENCOUNTER — Telehealth: Payer: Self-pay

## 2018-08-20 NOTE — Telephone Encounter (Signed)
Copied from CRM [id number]. Topic: General - Call Back - No Documentation >> Aug 20, 2018 10:41 AM Frederick Pace, NT wrote: Reason for CRM: Patient calling in in regards to a missed call. Please call back at [phone].

## 2018-08-31 ENCOUNTER — Other Ambulatory Visit: Payer: Self-pay | Admitting: Internal Medicine

## 2018-10-08 ENCOUNTER — Telehealth: Payer: Self-pay | Admitting: Internal Medicine

## 2018-10-08 MED ORDER — DOXYCYCLINE HYCLATE 100 MG PO TABS: 100.0000 mg | ORAL_TABLET | Freq: Two times a day (BID) | ORAL | 0 refills | Status: DC

## 2018-10-08 NOTE — Telephone Encounter (Signed)
Pt has a possible tick bite. He feels fine. He can not do a virtual. He is asking Dr. Derrel Nip to call in doxycycline (VIBRA-TABS) 100 MG tablet.

## 2018-10-08 NOTE — Telephone Encounter (Signed)
[facility]  Do not start unless you develop fever and a headache

## 2018-10-09 NOTE — Telephone Encounter (Signed)
Called pt to let him know that the medication was sent in but not to take it unless he develops a fever and a headache. The pt gave a verbal understanding. Pt stated that the only symptom he has developed since he removed the tick is loose stools. He stated that for the last three years every time he removes a tick he has developed loose stools.

## 2019-02-16 ENCOUNTER — Encounter (INDEPENDENT_AMBULATORY_CARE_PROVIDER_SITE_OTHER): Payer: Self-pay | Admitting: Nurse Practitioner

## 2019-02-16 ENCOUNTER — Other Ambulatory Visit: Payer: Self-pay

## 2019-02-16 ENCOUNTER — Encounter (INDEPENDENT_AMBULATORY_CARE_PROVIDER_SITE_OTHER): Payer: Self-pay

## 2019-02-16 ENCOUNTER — Ambulatory Visit (INDEPENDENT_AMBULATORY_CARE_PROVIDER_SITE_OTHER): Payer: Medicare Other | Admitting: Nurse Practitioner

## 2019-02-16 ENCOUNTER — Ambulatory Visit (INDEPENDENT_AMBULATORY_CARE_PROVIDER_SITE_OTHER): Payer: Medicare Other

## 2019-02-16 VITALS — BP 153/84 | HR 80 | Resp 17 | Ht 64.0 in | Wt 172.0 lb

## 2019-02-16 DIAGNOSIS — IMO0002 Reserved for concepts with insufficient information to code with codable children: Secondary | ICD-10-CM

## 2019-02-16 DIAGNOSIS — I10 Essential (primary) hypertension: Secondary | ICD-10-CM

## 2019-02-16 DIAGNOSIS — Q600 Renal agenesis, unilateral: Secondary | ICD-10-CM | POA: Diagnosis not present

## 2019-02-16 DIAGNOSIS — E782 Mixed hyperlipidemia: Secondary | ICD-10-CM

## 2019-02-16 DIAGNOSIS — I701 Atherosclerosis of renal artery: Secondary | ICD-10-CM

## 2019-02-22 ENCOUNTER — Encounter (INDEPENDENT_AMBULATORY_CARE_PROVIDER_SITE_OTHER): Payer: Self-pay | Admitting: Nurse Practitioner

## 2019-02-22 NOTE — Progress Notes (Signed)
SUBJECTIVE:  Patient ID: Frederick Ribas., male    DOB: 06-16-1950, 68 y.o.   MRN: [id number] Chief Complaint  Patient presents with  . Follow-up    HPI  Frederick Saunders Toure Edmonds. is a 68 y.o. male that presents today to the office for followup and review of the noninvasive studies regarding renal vascular hypertension and renal artery stenosis. There have been no interval changes in the patient's blood pressure control.  He denies any major changes in is medications.  The patient denies headache or flushing.  No flank or unusual back pain.    There have been no significant changes to the patient's overall health care.  No interval shortening of the patient's walking distance or new symptoms consistent with claudication.  The patient denies the  development of rest pain symptoms. No new ulcers or wounds have occurred since the last visit.  The patient denies amaurosis fugax or recent TIA symptoms. There are no recent neurological changes noted. The patient denies history of DVT, PE or superficial thrombophlebitis. The patient denies recent episodes of angina or shortness of breath.   The patient has a known singular right kidney  Duplex ultrasound of the renal arteries reveals a 1 to 59% stenosis of the right renal artery.  Right renal artery velocities are slightly decreased from previous exam on 12/08/2017.   Past Medical History:  Diagnosis Date  . HTN (hypertension)   . Other and unspecified hyperlipidemia     Past Surgical History:  Procedure Laterality Date  . ANTERIOR CRUCIATE LIGAMENT REPAIR Left 2004  . RENAL ANGIOGRAPHY Right 01/08/2018   Procedure: RENAL ANGIOGRAPHY;  Surgeon: Algernon Huxley, MD;  Location: [facility];  Service: Cardiovascular;  Laterality: Right;    Social History   Socioeconomic History  . Marital status: Single    Spouse name: Not on file  . Number of children: Not on file  . Years of education: Not on file  . Highest education level:  Not on file  Occupational History  . Not on file  Social Needs  . Financial resource strain: Not hard at all  . Food insecurity    Worry: Never true    Inability: Never true  . Transportation needs    Medical: No    Non-medical: No  Tobacco Use  . Smoking status: Never Smoker  . Smokeless tobacco: Never Used  Substance and Sexual Activity  . Alcohol use: Yes    Alcohol/week: 0.0 standard drinks    Comment: 1 drink every few months  . Drug use: No    Comment: Refused to answer  . Sexual activity: Not on file  Lifestyle  . Physical activity    Days per week: 0 days    Minutes per session: Not on file  . Stress: Not at all  Relationships  . Social Herbalist on phone: Not on file    Gets together: Not on file    Attends religious service: Not on file    Active member of club or organization: Not on file    Attends meetings of clubs or organizations: Not on file    Relationship status: Not on file  . Intimate partner violence    Fear of current or ex partner: Not on file    Emotionally abused: Not on file    Physically abused: Not on file    Forced sexual activity: Not on file  Other Topics Concern  . Not on file  Social History Narrative  . Not on file    Family History  Problem Relation Age of Onset  . Dementia Mother   . Heart disease Father 458       CABG  . Mitral valve prolapse Father   . Aortic aneurysm Father   . Hyperlipidemia Other        family Hx  . Heart disease Paternal Uncle        Valve replacements    No Known Allergies   Review of Systems   Review of Systems: Negative Unless Checked Constitutional: [] Weight loss  [] Fever  [] Chills Cardiac: [] Chest pain   []  Atrial Fibrillation  [] Palpitations   [] Shortness of breath when laying flat   [] Shortness of breath with exertion. [] Shortness of breath at rest Vascular:  [] Pain in legs with walking   [] Pain in legs with standing [] Pain in legs when laying flat   [] Claudication    [] Pain  in feet when laying flat    [] History of DVT   [] Phlebitis   [] Swelling in legs   [] Varicose veins   [] Non-healing ulcers Pulmonary:   [] Uses home oxygen   [] Productive cough   [] Hemoptysis   [] Wheeze  [] COPD   [] Asthma Neurologic:  [] Dizziness   [] Seizures  [] Blackouts [] History of stroke   [] History of TIA  [] Aphasia   [] Temporary Blindness   [] Weakness or numbness in arm   [] Weakness or numbness in leg Musculoskeletal:   [] Joint swelling   [] Joint pain   [] Low back pain  []  History of Knee Replacement [x] Arthritis [] back Surgeries  []  Spinal Stenosis    Hematologic:  [] Easy bruising  [] Easy bleeding   [] Hypercoagulable state   [] Anemic Gastrointestinal:  [] Diarrhea   [] Vomiting  [] Gastroesophageal reflux/heartburn   [] Difficulty swallowing. [] Abdominal pain Genitourinary:  [] Chronic kidney disease   [] Difficult urination  [] Anuric   [] Blood in urine [] Frequent urination  [] Burning with urination   [] Hematuria Skin:  [] Rashes   [] Ulcers [] Wounds Psychological:  [] History of anxiety   []  History of major depression  []  Memory Difficulties      OBJECTIVE:   Physical Exam  BP (!) 153/84 (BP Location: Right Arm)   Pulse 80   Resp 17   Ht 5\' 4"  (1.626 m)   Wt 172 lb (78 kg)   BMI 29.52 kg/m   Gen: WD/WN, NAD Head: [place]/AT, No temporalis wasting.  Ear/Nose/Throat: Hearing grossly intact, nares w/o erythema or drainage Eyes: PER, EOMI, sclera nonicteric.  Neck: Supple, no masses.  No JVD.  Pulmonary:  Good air movement, no use of accessory muscles.  Cardiac: RRR Vascular:  Vessel Right Left  Radial Palpable Palpable   Gastrointestinal: soft, non-distended. No guarding/no peritoneal signs.  Musculoskeletal: M/S 5/5 throughout.  No deformity or atrophy.  Neurologic: Pain and light touch intact in extremities.  Symmetrical.  Speech is fluent. Motor exam as listed above. Psychiatric: Judgment intact, Mood & affect appropriate for pt's clinical situation. Dermatologic: No Venous rashes. No  Ulcers Noted.  No changes consistent with cellulitis. Lymph : No Cervical lymphadenopathy, no lichenification or skin changes of chronic lymphedema.       ASSESSMENT AND PLAN:  1. Renal artery stenosis (HCC) Recommend:  The patient has evidence of atherosclerotic changes of the renal artery.  At this time the patient's blood pressure is fairly well controlled.  Patient does not need angiography of the renal artery given the good control of his hypertension.    However, if at any point the patient's BP becomes  acutely worse then intervention would be strongly encouraged.  The patient voices understanding of this plan and agrees.   The patient currently request to have his follow-ups moved to every 2 years.  Given the fact that the patient does have some elevated velocities, as well as one functional kidney, I feel that 2 years was 244 checkups.  Therefore we will adjust to 18 months.  However the patient is cautioned that if there are issues with blood pressure or blood test indicate issues with renal function he should follow-up sooner.  2. Essential hypertension Continue antihypertensive medications as already ordered, these medications have been reviewed and there are no changes at this time.   3. Mixed hyperlipidemia Continue statin as ordered and reviewed, no changes at this time    Current Outpatient Medications on File Prior to Visit  Medication Sig Dispense Refill  . amLODipine (NORVASC) 10 MG tablet TAKE ONE TABLET BY MOUTH EVERY DAY 90 tablet 1  . losartan (COZAAR) 100 MG tablet Take 1 tablet (100 mg total) by mouth daily. 90 tablet 1  . Red Yeast Rice Extract 600 MG CAPS Take 1 capsule by mouth 2 (two) times daily.     [name] triamterene-hydrochlorothiazide (MAXZIDE-25) 37.5-25 MG tablet TAKE 1 TABLET BY MOUTH DAILY 90 tablet 3  . doxycycline (VIBRA-TABS) 100 MG tablet Take 1 tablet (100 mg total) by mouth 2 (two) times daily. (Patient not taking: Reported on 02/16/2019) 20  tablet 0  . potassium chloride SA (K-DUR,KLOR-CON) 20 MEQ tablet Take 1 tablet (20 mEq total) by mouth daily. (Patient not taking: Reported on 02/16/2019) 5 tablet 0   No current facility-administered medications on file prior to visit.     There are no Patient Instructions on file for this visit. No follow-ups on file.   Georgiana Spinner, NP  This note was completed with Office manager.  Any errors are purely unintentional.

## 2019-02-25 ENCOUNTER — Other Ambulatory Visit: Payer: Self-pay | Admitting: Internal Medicine

## 2019-05-27 ENCOUNTER — Other Ambulatory Visit: Payer: Self-pay | Admitting: Internal Medicine

## 2019-06-11 ENCOUNTER — Other Ambulatory Visit: Payer: Self-pay

## 2019-06-11 ENCOUNTER — Ambulatory Visit (INDEPENDENT_AMBULATORY_CARE_PROVIDER_SITE_OTHER): Payer: Medicare Other

## 2019-06-11 ENCOUNTER — Ambulatory Visit: Payer: Medicare Other | Admitting: Internal Medicine

## 2019-06-11 VITALS — Ht 64.0 in | Wt 172.0 lb

## 2019-06-11 DIAGNOSIS — Z0000 Encounter for general adult medical examination without abnormal findings: Secondary | ICD-10-CM | POA: Diagnosis not present

## 2019-06-11 NOTE — Patient Instructions (Addendum)
  Mr. Hetland , Thank you for taking time to come for your Medicare Wellness Visit. I appreciate your ongoing commitment to your health goals. Please review the following plan we discussed and let me know if I can assist you in the future.   These are the goals we discussed: Goals    . DIET - INCREASE LEAN PROTEINS     Low carb foods Healthy snack Portion control       This is a list of the screening recommended for you and due dates:  Health Maintenance  Topic Date Due  . Colon Cancer Screening  12/18/2018  . Flu Shot  06/23/2019*  . Pneumonia vaccines (1 of 2 - PCV13) 06/10/2020*  . Tetanus Vaccine  12/13/2024  .  Hepatitis C: One time screening is recommended by Center for Disease Control  (CDC) for  adults born from [birth year] through 1963.   Completed  *Topic was postponed. The date shown is not the original due date.

## 2019-06-11 NOTE — Progress Notes (Addendum)
Subjective:   Frederick Pace. is a 69 y.o. male who presents for Medicare Annual/Subsequent preventive examination.  Review of Systems:  No ROS.  Medicare Wellness Virtual Visit.  Visual/audio telehealth visit, UTA vital signs.   See social history for additional risk factors.   Cardiac Risk Factors include: advanced age (>12men, >50 women);hypertension;male gender     Objective:    Vitals: Ht 5\' 4"  (1.626 m)   Wt 172 lb (78 kg)   BMI 29.52 kg/m   Body mass index is 29.52 kg/m.  Advanced Directives 06/11/2019 06/05/2018  Does Patient Have a Medical Advance Directive? No No  Would patient like information on creating a medical advance directive? No - Patient declined No - Patient declined    Tobacco Social History   Tobacco Use  Smoking Status Never Smoker  Smokeless Tobacco Never Used     Counseling given: Not Answered   Clinical Intake:  Pre-visit preparation completed: Yes        Diabetes: No  How often do you need to have someone help you when you read instructions, pamphlets, or other written materials from your doctor or pharmacy?: 1 - Never  Interpreter Needed?: No     Past Medical History:  Diagnosis Date  . HTN (hypertension)   . Other and unspecified hyperlipidemia    Past Surgical History:  Procedure Laterality Date  . ANTERIOR CRUCIATE LIGAMENT REPAIR Left 2004  . RENAL ANGIOGRAPHY Right 01/08/2018   Procedure: RENAL ANGIOGRAPHY;  Surgeon: 01/10/2018, MD;  Location: [facility];  Service: Cardiovascular;  Laterality: Right;   Family History  Problem Relation Age of Onset  . Dementia Mother   . Heart disease Father 41       CABG  . Mitral valve prolapse Father   . Aortic aneurysm Father   . Hyperlipidemia Other        family Hx  . Heart disease Paternal Uncle        Valve replacements   Social History   Socioeconomic History  . Marital status: Single    Spouse name: Not on file  . Number of children: Not on  file  . Years of education: Not on file  . Highest education level: Not on file  Occupational History  . Not on file  Tobacco Use  . Smoking status: Never Smoker  . Smokeless tobacco: Never Used  Substance and Sexual Activity  . Alcohol use: Yes    Alcohol/week: 0.0 standard drinks    Comment: 1 drink every few months  . Drug use: No    Comment: Refused to answer  . Sexual activity: Not on file  Other Topics Concern  . Not on file  Social History Narrative  . Not on file   Social Determinants of Health   Financial Resource Strain:   . Difficulty of Paying Living Expenses:   Food Insecurity:   . Worried About 43 in the Last Year:   . Programme researcher, broadcasting/film/video in the Last Year:   Transportation Needs:   . Barista (Medical):   Freight forwarder Lack of Transportation (Non-Medical):   Physical Activity:   . Days of Exercise per Week:   . Minutes of Exercise per Session:   Stress:   . Feeling of Stress :   Social Connections:   . Frequency of Communication with Friends and Family:   . Frequency of Social Gatherings with Friends and Family:   . Attends Religious  Services:   . Active Member of Clubs or Organizations:   . Attends Archivist Meetings:   [name] Marital Status:     Outpatient Encounter Medications as of 06/11/2019  Medication Sig  . amLODipine (NORVASC) 10 MG tablet TAKE 1 TABLET BY MOUTH DAILY  . losartan (COZAAR) 100 MG tablet TAKE 1 TABLET BY MOUTH DAILY  . triamterene-hydrochlorothiazide (MAXZIDE-25) 37.5-25 MG tablet TAKE 1 TABLET BY MOUTH DAILY  . doxycycline (VIBRA-TABS) 100 MG tablet Take 1 tablet (100 mg total) by mouth 2 (two) times daily. (Patient not taking: Reported on 02/16/2019)  . potassium chloride SA (K-DUR,KLOR-CON) 20 MEQ tablet Take 1 tablet (20 mEq total) by mouth daily. (Patient not taking: Reported on 02/16/2019)  . Red Yeast Rice Extract 600 MG CAPS Take 1 capsule by mouth 2 (two) times daily.    No facility-administered  encounter medications on file as of 06/11/2019.    Activities of Daily Living In your present state of health, do you have any difficulty performing the following activities: 06/11/2019  Hearing? N  Vision? N  Difficulty concentrating or making decisions? N  Walking or climbing stairs? N  Dressing or bathing? N  Doing errands, shopping? N  Preparing Food and eating ? N  Using the Toilet? N  In the past six months, have you accidently leaked urine? N  Do you have problems with loss of bowel control? N  Managing your Medications? N  Managing your Finances? N  Housekeeping or managing your Housekeeping? N  Some recent data might be hidden    Patient Care Team: Crecencio Mc, MD as PCP - General (Internal Medicine)   Assessment:   This is a routine wellness examination for [facility].  Nurse connected with patient 06/11/19 at 11:00 AM EDT by a telephone enabled telemedicine application and verified that I am speaking with the correct person using two identifiers. Patient stated full name and DOB. Patient gave permission to continue with virtual visit. Patient's location was at home and Nurse's location was at [place] office.   Patient is alert and oriented x3. Patient denies difficulty focusing or concentrating. 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   Health Maintenance Due: -PNA vaccine- discussed; to be completed with doctor in visit or local pharmacy.  -Cologuard- received; in the process. -Influenza; declined. -Covid vaccine- plans to complete. -Shingle vaccine- plans to complete 4 weeks after covid vaccine completion. See completed HM at the end of note.   Eye: Visual acuity not assessed. Virtual visit. Followed by their ophthalmologist.  Dental: UTD  Hearing: Demonstrates normal hearing during visit.  Safety:  Patient feels safe at home- yes Patient does have smoke detectors at home- yes Patient does wear sunscreen or protective  clothing when in direct sunlight - yes Patient does wear seat belt when in a moving vehicle - yes Patient drives- yes Adequate lighting in walkways free from debris- yes Grab bars and handrails used as appropriate- yes Ambulates with an assistive device- no Cell phone on person when ambulating outside of the home- yes  Social: Alcohol intake - yes      Smoking history- never  Smokers in home? none Illicit drug use? none  Medication: Taking as directed and without issues.  Self managed - yes   Covid-19: Precautions and sickness symptoms discussed. Wears mask, social distancing, hand hygiene as appropriate.   Activities of Daily Living Patient denies needing assistance with: household chores, feeding themselves, getting from bed to chair, getting to  the toilet, bathing/showering, dressing, managing money, or preparing meals.   Discussed the importance of a healthy diet, water intake and the benefits of aerobic exercise.   Physical activity- currently employed   Diet:  Regular Water: good intake  Other Providers Patient Care Team: Sherlene Shams, MD as PCP - General (Internal Medicine)  Exercise Activities and Dietary recommendations    Goals    . DIET - INCREASE LEAN PROTEINS     Low carb foods Healthy snack Portion control       Fall Risk Fall Risk  06/11/2019 06/05/2018 11/21/2017 09/15/2016  Falls in the past year? 0 0 No No  Follow up Falls prevention discussed - - -    Timed Get Up and Go Performed: no, virtual visit  Depression Screen PHQ 2/9 Scores 06/11/2019 06/05/2018 11/21/2017 09/15/2016  PHQ - 2 Score 0 0 0 0  PHQ- 9 Score - - 0 -    Cognitive Function MMSE - Mini Mental State Exam 06/11/2019  Not completed: Unable to complete     6CIT Screen 06/11/2019 06/05/2018  What Year? 0 points 0 points  What month? 0 points 0 points  What time? 0 points 0 points  Count back from 20 - 0 points  Months in reverse - 0 points  Repeat phrase - 0 points    Total Score - 0    Immunization History  Administered Date(s) Administered  . Tdap 12/14/2014   Screening Tests Health Maintenance  Topic Date Due  . COLONOSCOPY  12/18/2018  . INFLUENZA VACCINE  06/23/2019 (Originally 10/24/2018)  . PNA vac Low Risk Adult (1 of 2 - PCV13) 06/10/2020 (Originally 05/03/2017)  . TETANUS/TDAP  12/13/2024  . Hepatitis C Screening  Completed       Plan:   Keep all routine maintenance appointments.   Follow up 07/23/19 @ 1:30  Medicare Attestation I have personally reviewed: The patient's medical and social history Their use of alcohol, tobacco or illicit drugs Their current medications and supplements The patient's functional ability including ADLs,fall risks, home safety risks, cognitive, and hearing and visual impairment Diet and physical activities Evidence for depression   I have reviewed and discussed with patient certain preventive protocols, quality metrics, and best practice recommendations.   OBrien-Blaney, Arleny Kruger L, LPN  07/11/3788   I have reviewed the above information and agree with above.   Duncan Dull, MD

## 2019-07-21 ENCOUNTER — Other Ambulatory Visit: Payer: Self-pay

## 2019-07-23 ENCOUNTER — Other Ambulatory Visit: Payer: Self-pay

## 2019-07-23 ENCOUNTER — Encounter: Payer: Self-pay | Admitting: Internal Medicine

## 2019-07-23 ENCOUNTER — Ambulatory Visit (INDEPENDENT_AMBULATORY_CARE_PROVIDER_SITE_OTHER): Payer: Medicare Other | Admitting: Internal Medicine

## 2019-07-23 VITALS — BP 142/76 | HR 93 | Temp 97.5°F | Resp 15 | Ht 64.0 in | Wt 173.6 lb

## 2019-07-23 DIAGNOSIS — I150 Renovascular hypertension: Secondary | ICD-10-CM

## 2019-07-23 DIAGNOSIS — T502X5A Adverse effect of carbonic-anhydrase inhibitors, benzothiadiazides and other diuretics, initial encounter: Secondary | ICD-10-CM

## 2019-07-23 DIAGNOSIS — I10 Essential (primary) hypertension: Secondary | ICD-10-CM

## 2019-07-23 DIAGNOSIS — Z125 Encounter for screening for malignant neoplasm of prostate: Secondary | ICD-10-CM

## 2019-07-23 DIAGNOSIS — E876 Hypokalemia: Secondary | ICD-10-CM

## 2019-07-23 DIAGNOSIS — E782 Mixed hyperlipidemia: Secondary | ICD-10-CM

## 2019-07-23 DIAGNOSIS — Z1211 Encounter for screening for malignant neoplasm of colon: Secondary | ICD-10-CM

## 2019-07-23 DIAGNOSIS — R5383 Other fatigue: Secondary | ICD-10-CM

## 2019-07-23 DIAGNOSIS — R7301 Impaired fasting glucose: Secondary | ICD-10-CM

## 2019-07-23 DIAGNOSIS — I701 Atherosclerosis of renal artery: Secondary | ICD-10-CM

## 2019-07-23 LAB — CBC WITH DIFFERENTIAL/PLATELET
Basophils Absolute: 0.1 10*3/uL (ref 0.0–0.1)
Basophils Relative: 0.7 % (ref 0.0–3.0)
Eosinophils Absolute: 0.2 10*3/uL (ref 0.0–0.7)
Eosinophils Relative: 1.6 % (ref 0.0–5.0)
HCT: 41.9 % (ref 39.0–52.0)
Hemoglobin: 14.7 g/dL (ref 13.0–17.0)
Lymphocytes Relative: 25.1 % (ref 12.0–46.0)
Lymphs Abs: 2.4 10*3/uL (ref 0.7–4.0)
MCHC: 35.2 g/dL (ref 30.0–36.0)
MCV: 88.2 fl (ref 78.0–100.0)
Monocytes Absolute: 0.9 10*3/uL (ref 0.1–1.0)
Monocytes Relative: 9.6 % (ref 3.0–12.0)
Neutro Abs: 5.9 10*3/uL (ref 1.4–7.7)
Neutrophils Relative %: 63 % (ref 43.0–77.0)
Platelets: 258 10*3/uL (ref 150.0–400.0)
RBC: 4.75 Mil/uL (ref 4.22–5.81)
RDW: 13.1 % (ref 11.5–15.5)
WBC: 9.4 10*3/uL (ref 4.0–10.5)

## 2019-07-23 LAB — LIPID PANEL
Cholesterol: 174 mg/dL (ref 0–200)
HDL: 39.6 mg/dL (ref >39.00)
LDL Cholesterol: 107 mg/dL — ABNORMAL HIGH (ref 0–99)
NonHDL: 134.32
Total CHOL/HDL Ratio: 4
Triglycerides: 139 mg/dL (ref 0.0–149.0)
VLDL: 27.8 mg/dL (ref 0.0–40.0)

## 2019-07-23 LAB — COMPREHENSIVE METABOLIC PANEL
ALT: 16 U/L (ref 0–53)
AST: 14 U/L (ref 0–37)
Albumin: 4.7 g/dL (ref 3.5–5.2)
Alkaline Phosphatase: 63 U/L (ref 39–117)
BUN: 20 mg/dL (ref 6–23)
CO2: 26 mEq/L (ref 19–32)
Calcium: 9.7 mg/dL (ref 8.4–10.5)
Chloride: 97 mEq/L (ref 96–112)
Creatinine, Ser: 1.2 mg/dL (ref 0.40–1.50)
GFR: 60.35 mL/min (ref >60.00)
Glucose, Bld: 108 mg/dL — ABNORMAL HIGH (ref 70–99)
Potassium: 3.2 mEq/L — ABNORMAL LOW (ref 3.5–5.1)
Sodium: 134 mEq/L — ABNORMAL LOW (ref 135–145)
Total Bilirubin: 0.9 mg/dL (ref 0.2–1.2)
Total Protein: 7.6 g/dL (ref 6.0–8.3)

## 2019-07-23 LAB — MICROALBUMIN / CREATININE URINE RATIO
Creatinine,U: 141.4 mg/dL
Microalb Creat Ratio: 2.8 mg/g (ref 0.0–30.0)
Microalb, Ur: 3.9 mg/dL — ABNORMAL HIGH (ref 0.0–1.9)

## 2019-07-23 LAB — TSH: TSH: 1.33 u[IU]/mL (ref 0.35–4.50)

## 2019-07-23 LAB — PSA, MEDICARE: PSA: 0.36 ng/ml (ref 0.10–4.00)
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
## 2019-07-23 NOTE — Patient Instructions (Addendum)
Your blood pressure is too high. Preserving your kidney function  Depends on controlling your blood pressure   I am adding 25 mg Toprol XL to your current regimen .  Take it once daily in the evening.  Continue amlodipine, losartan and maxzide.   I will initiate the order for your colon cancer screening  Test with  Cologuard.  It will be delivered to your house, and you will send off a stool sample in the envelope it provides.     Managing Your Hypertension Hypertension is commonly called high blood pressure. This is when the force of your blood pressing against the walls of your arteries is too strong. Arteries are blood vessels that carry blood from your heart throughout your body. Hypertension forces the heart to work harder to pump blood, and may cause the arteries to become narrow or stiff. Having untreated or uncontrolled hypertension can cause heart attack, stroke, kidney disease, and other problems. What are blood pressure readings? A blood pressure reading consists of a higher number over a lower number. Ideally, your blood pressure should be below 120/80. The first ("top") number is called the systolic pressure. It is a measure of the pressure in your arteries as your heart beats. The second ("bottom") number is called the diastolic pressure. It is a measure of the pressure in your arteries as the heart relaxes. What does my blood pressure reading mean? Blood pressure is classified into four stages. Based on your blood pressure reading, your health care provider may use the following stages to determine what type of treatment you need, if any. Systolic pressure and diastolic pressure are measured in a unit called mm Hg. Normal  Systolic pressure: below 120.  Diastolic pressure: below 80. Elevated  Systolic pressure: 120-129.  Diastolic pressure: below 80. Hypertension stage 1  Systolic pressure: 130-139.  Diastolic pressure: 80-89. Hypertension stage 2  Systolic pressure: 140  or above.  Diastolic pressure: 90 or above. What health risks are associated with hypertension? Managing your hypertension is an important responsibility. Uncontrolled hypertension can lead to:  A heart attack.  A stroke.  A weakened blood vessel (aneurysm).  Heart failure.  Kidney damage.  Eye damage.  Metabolic syndrome.  Memory and concentration problems. What changes can I make to manage my hypertension? Hypertension can be managed by making lifestyle changes and possibly by taking medicines. Your health care provider will help you make a plan to bring your blood pressure within a normal range. Eating and drinking   Eat a diet that is high in fiber and potassium, and low in salt (sodium), added sugar, and fat. An example eating plan is called the DASH (Dietary Approaches to Stop Hypertension) diet. To eat this way: ? Eat plenty of fresh fruits and vegetables. Try to fill half of your plate at each meal with fruits and vegetables. ? Eat whole grains, such as whole wheat pasta, brown rice, or whole grain bread. Fill about one quarter of your plate with whole grains. ? Eat low-fat diary products. ? Avoid fatty cuts of meat, processed or cured meats, and poultry with skin. Fill about one quarter of your plate with lean proteins such as fish, chicken without skin, beans, eggs, and tofu. ? Avoid premade and processed foods. These tend to be higher in sodium, added sugar, and fat.  Reduce your daily sodium intake. Most people with hypertension should eat less than 1,500 mg of sodium a day.  Limit alcohol intake to no more than 1 drink  a day for nonpregnant women and 2 drinks a day for men. One drink equals 12 oz of beer, 5 oz of wine, or 1 oz of hard liquor. Lifestyle  Work with your health care provider to maintain a healthy body weight, or to lose weight. Ask what an ideal weight is for you.  Get at least 30 minutes of exercise that causes your heart to beat faster (aerobic  exercise) most days of the week. Activities may include walking, swimming, or biking.  Include exercise to strengthen your muscles (resistance exercise), such as weight lifting, as part of your weekly exercise routine. Try to do these types of exercises for 30 minutes at least 3 days a week.  Do not use any products that contain nicotine or tobacco, such as cigarettes and e-cigarettes. If you need help quitting, ask your health care provider.  Control any long-term (chronic) conditions you have, such as high cholesterol or diabetes. Monitoring  Monitor your blood pressure at home as told by your health care provider. Your personal target blood pressure may vary depending on your medical conditions, your age, and other factors.  Have your blood pressure checked regularly, as often as told by your health care provider. Working with your health care provider  Review all the medicines you take with your health care provider because there may be side effects or interactions.  Talk with your health care provider about your diet, exercise habits, and other lifestyle factors that may be contributing to hypertension.  Visit your health care provider regularly. Your health care provider can help you create and adjust your plan for managing hypertension. Will I need medicine to control my blood pressure? Your health care provider may prescribe medicine if lifestyle changes are not enough to get your blood pressure under control, and if:  Your systolic blood pressure is 130 or higher.  Your diastolic blood pressure is 80 or higher. Take medicines only as told by your health care provider. Follow the directions carefully. Blood pressure medicines must be taken as prescribed. The medicine does not work as well when you skip doses. Skipping doses also puts you at risk for problems. Contact a health care provider if:  You think you are having a reaction to medicines you have taken.  You have repeated  (recurrent) headaches.  You feel dizzy.  You have swelling in your ankles.  You have trouble with your vision. Get help right away if:  You develop a severe headache or confusion.  You have unusual weakness or numbness, or you feel faint.  You have severe pain in your chest or abdomen.  You vomit repeatedly.  You have trouble breathing. Summary  Hypertension is when the force of blood pumping through your arteries is too strong. If this condition is not controlled, it may put you at risk for serious complications.  Your personal target blood pressure may vary depending on your medical conditions, your age, and other factors. For most people, a normal blood pressure is less than 120/80.  Hypertension is managed by lifestyle changes, medicines, or both. Lifestyle changes include weight loss, eating a healthy, low-sodium diet, exercising more, and limiting alcohol. This information is not intended to replace advice given to you by your health care provider. Make sure you discuss any questions you have with your health care provider. Document Revised: 07/03/2018 Document Reviewed: 02/07/2016 Elsevier Patient Education  [place].

## 2019-07-25 DIAGNOSIS — Z125 Encounter for screening for malignant neoplasm of prostate: Secondary | ICD-10-CM | POA: Insufficient documentation

## 2019-07-25 MED ORDER — METOPROLOL SUCCINATE ER 25 MG PO TB24
25.0000 mg | ORAL_TABLET | Freq: Every day | ORAL | 3 refills | Status: DC
Start: 2019-07-25 — End: 2019-08-25

## 2019-07-25 MED ORDER — SPIRONOLACTONE 50 MG PO TABS
50.0000 mg | ORAL_TABLET | Freq: Every day | ORAL | 2 refills | Status: DC | PRN
Start: 2019-07-25 — End: 2021-07-17

## 2019-07-25 NOTE — Assessment & Plan Note (Addendum)
Discussed goal of < 130/80 . Which he states he has met based on home readings.  Continue amlodipine . losartan and maxzide.  Will change maxzide to spironolactone given recurrent hypokalemia  If he has been diligent taking a daily potassium  supplement. .   Lab Results  Component Value Date   CREATININE 1.20 07/23/2019   Lab Results  Component Value Date   NA 134 (L) 07/23/2019   K 3.2 (L) 07/23/2019   CL 97 07/23/2019   CO2 26 07/23/2019  '

## 2019-07-25 NOTE — Assessment & Plan Note (Addendum)
Greater than 60% stenosis was noted or original doppler imaging done by vascular surgery.  He has only a right kidney . Repeat doppler in Nov 2020 noted < 60% stenosis and decreased velocity.  Renal function has been stable on losartan. Periodic imaging is up to date .  Lab Results  Component Value Date   CREATININE 1.20 07/23/2019   CREATININE 1.00 07/15/2018   CREATININE 0.89 01/08/2018   Lab Results  Component Value Date   NA 134 (L) 07/23/2019   K 3.2 (L) 07/23/2019   CL 97 07/23/2019   CO2 26 07/23/2019

## 2019-07-25 NOTE — Assessment & Plan Note (Addendum)
Annual psa's have been normal   Lab Results  Component Value Date   PSA 0.36 07/23/2019   PSA 0.4 11/21/2017   PSA 0.4 09/13/2016

## 2019-07-25 NOTE — Assessment & Plan Note (Signed)
Stopping maxzide due to recurrent hypokalemia

## 2019-07-25 NOTE — Assessment & Plan Note (Addendum)
Adding toprol XL and stoping maxzide due to recurrent hypokalemia.  Continue amlodipine  and losartan.  Prn spironolactone for edema

## 2019-07-25 NOTE — Assessment & Plan Note (Addendum)
His 10 yr risk using FRC is 31% .  Statin strongly advised.  Lab Results  Component Value Date   CHOL 174 07/23/2019   HDL 39.60 07/23/2019   LDLCALC 107 (H) 07/23/2019   LDLDIRECT 135.0 07/15/2018   TRIG 139.0 07/23/2019   CHOLHDL 4 07/23/2019

## 2019-08-04 LAB — COLOGUARD

## 2019-08-25 ENCOUNTER — Telehealth: Payer: Self-pay | Admitting: Internal Medicine

## 2019-08-25 MED ORDER — METOPROLOL SUCCINATE ER 25 MG PO TB24: 25.0000 mg | ORAL_TABLET | Freq: Every day | ORAL | 1 refills | Status: DC

## 2019-08-25 NOTE — Telephone Encounter (Signed)
Patient never received his metoprolol succinate (TOPROL-XL) 25 MG 24 hr tablet. He just called [facility] and they do not have the prescribtion, 07/25/19.

## 2019-08-25 NOTE — Telephone Encounter (Signed)
Medication has been refilled.

## 2019-09-02 ENCOUNTER — Other Ambulatory Visit: Payer: Self-pay | Admitting: Internal Medicine

## 2019-09-10 ENCOUNTER — Encounter: Payer: Self-pay | Admitting: Pharmacist

## 2019-12-06 ENCOUNTER — Other Ambulatory Visit: Payer: Self-pay | Admitting: Internal Medicine

## 2019-12-07 ENCOUNTER — Other Ambulatory Visit: Payer: Self-pay | Admitting: Internal Medicine

## 2020-02-04 ENCOUNTER — Ambulatory Visit: Payer: Medicare Other | Admitting: Internal Medicine

## 2020-03-06 ENCOUNTER — Other Ambulatory Visit: Payer: Self-pay | Admitting: Internal Medicine

## 2020-04-20 ENCOUNTER — Encounter: Payer: Self-pay | Admitting: Internal Medicine

## 2020-04-20 DIAGNOSIS — Z1211 Encounter for screening for malignant neoplasm of colon: Secondary | ICD-10-CM | POA: Diagnosis not present

## 2020-04-26 ENCOUNTER — Telehealth: Payer: Self-pay

## 2020-04-26 DIAGNOSIS — E876 Hypokalemia: Secondary | ICD-10-CM

## 2020-04-26 DIAGNOSIS — E559 Vitamin D deficiency, unspecified: Secondary | ICD-10-CM

## 2020-04-26 DIAGNOSIS — E782 Mixed hyperlipidemia: Secondary | ICD-10-CM

## 2020-04-26 DIAGNOSIS — E669 Obesity, unspecified: Secondary | ICD-10-CM

## 2020-04-26 DIAGNOSIS — T502X5A Adverse effect of carbonic-anhydrase inhibitors, benzothiadiazides and other diuretics, initial encounter: Secondary | ICD-10-CM

## 2020-04-26 NOTE — Telephone Encounter (Signed)
Pt called and scheduled a physical for 06/09/20. He wants to do labs before his visit. Since there was a lab order in the system I went ahead and scheduled pt for 06/07/20. Does patient need any other lab orders since he is coming for a physical?

## 2020-04-26 NOTE — Addendum Note (Signed)
Addended by: Sherlene Shams on: 04/26/2020 10:15 PM   Modules accepted: Orders

## 2020-04-26 NOTE — Telephone Encounter (Signed)
fASTING LIPIDS ORDERED

## 2020-04-28 LAB — COLOGUARD
COLOGUARD: NEGATIVE
Cologuard: NEGATIVE

## 2020-05-21 ENCOUNTER — Telehealth: Payer: Self-pay | Admitting: Internal Medicine

## 2020-05-21 LAB — COLOGUARD: Cologuard: NEGATIVE

## 2020-05-21 NOTE — Telephone Encounter (Signed)
The results of patient's cologuard is negative and have been abstracted.   We will repeat every 3 years for  CA screening.

## 2020-05-22 NOTE — Telephone Encounter (Signed)
Spoke with pt and informed him of his cologuard results. Pt gave a verbal understanding.

## 2020-06-06 ENCOUNTER — Other Ambulatory Visit: Payer: Self-pay | Admitting: Internal Medicine

## 2020-06-07 ENCOUNTER — Other Ambulatory Visit: Payer: Medicare Other

## 2020-06-09 ENCOUNTER — Encounter: Payer: Self-pay | Admitting: Internal Medicine

## 2020-06-09 ENCOUNTER — Telehealth: Payer: Self-pay | Admitting: Internal Medicine

## 2020-06-09 ENCOUNTER — Ambulatory Visit (INDEPENDENT_AMBULATORY_CARE_PROVIDER_SITE_OTHER): Payer: Medicare Other | Admitting: Internal Medicine

## 2020-06-09 ENCOUNTER — Other Ambulatory Visit: Payer: Self-pay

## 2020-06-09 ENCOUNTER — Ambulatory Visit (INDEPENDENT_AMBULATORY_CARE_PROVIDER_SITE_OTHER): Payer: Medicare Other

## 2020-06-09 VITALS — BP 146/72 | HR 41 | Temp 98.0°F | Resp 15 | Ht 64.0 in | Wt 183.4 lb

## 2020-06-09 DIAGNOSIS — E876 Hypokalemia: Secondary | ICD-10-CM

## 2020-06-09 DIAGNOSIS — I10 Essential (primary) hypertension: Secondary | ICD-10-CM

## 2020-06-09 DIAGNOSIS — E559 Vitamin D deficiency, unspecified: Secondary | ICD-10-CM | POA: Diagnosis not present

## 2020-06-09 DIAGNOSIS — M79641 Pain in right hand: Secondary | ICD-10-CM | POA: Diagnosis not present

## 2020-06-09 DIAGNOSIS — M5090 Cervical disc disorder, unspecified, unspecified cervical region: Secondary | ICD-10-CM

## 2020-06-09 DIAGNOSIS — E782 Mixed hyperlipidemia: Secondary | ICD-10-CM | POA: Diagnosis not present

## 2020-06-09 DIAGNOSIS — M19041 Primary osteoarthritis, right hand: Secondary | ICD-10-CM | POA: Diagnosis not present

## 2020-06-09 DIAGNOSIS — E669 Obesity, unspecified: Secondary | ICD-10-CM

## 2020-06-09 DIAGNOSIS — Z125 Encounter for screening for malignant neoplasm of prostate: Secondary | ICD-10-CM

## 2020-06-09 DIAGNOSIS — Z7185 Encounter for immunization safety counseling: Secondary | ICD-10-CM | POA: Diagnosis not present

## 2020-06-09 DIAGNOSIS — I701 Atherosclerosis of renal artery: Secondary | ICD-10-CM

## 2020-06-09 DIAGNOSIS — T502X5A Adverse effect of carbonic-anhydrase inhibitors, benzothiadiazides and other diuretics, initial encounter: Secondary | ICD-10-CM

## 2020-06-09 DIAGNOSIS — R93422 Abnormal radiologic findings on diagnostic imaging of left kidney: Secondary | ICD-10-CM

## 2020-06-09 DIAGNOSIS — Z0000 Encounter for general adult medical examination without abnormal findings: Secondary | ICD-10-CM

## 2020-06-09 LAB — COMPREHENSIVE METABOLIC PANEL
ALT: 16 U/L (ref 0–53)
AST: 13 U/L (ref 0–37)
Albumin: 4.6 g/dL (ref 3.5–5.2)
Alkaline Phosphatase: 81 U/L (ref 39–117)
BUN: 18 mg/dL (ref 6–23)
CO2: 27 mEq/L (ref 19–32)
Calcium: 9.8 mg/dL (ref 8.4–10.5)
Chloride: 100 mEq/L (ref 96–112)
Creatinine, Ser: 1.14 mg/dL (ref 0.40–1.50)
GFR: 66.27 mL/min (ref >60.00)
Glucose, Bld: 100 mg/dL — ABNORMAL HIGH (ref 70–99)
Potassium: 4.5 mEq/L (ref 3.5–5.1)
Sodium: 138 mEq/L (ref 135–145)
Total Bilirubin: 0.9 mg/dL (ref 0.2–1.2)
Total Protein: 7.7 g/dL (ref 6.0–8.3)

## 2020-06-09 LAB — URIC ACID: Uric Acid, Serum: 7.6 mg/dL (ref 4.0–7.8)

## 2020-06-09 LAB — MICROALBUMIN / CREATININE URINE RATIO
Creatinine,U: 201.5 mg/dL
Microalb Creat Ratio: 8 mg/g (ref 0.0–30.0)
Microalb, Ur: 16.1 mg/dL — ABNORMAL HIGH (ref 0.0–1.9)

## 2020-06-09 LAB — HEMOGLOBIN A1C: Hgb A1c MFr Bld: 5.7 % (ref 4.6–6.5)

## 2020-06-09 LAB — VITAMIN D 25 HYDROXY (VIT D DEFICIENCY, FRACTURES): VITD: 31.29 ng/mL (ref 30.00–100.00)

## 2020-06-09 LAB — LIPID PANEL
Cholesterol: 207 mg/dL — ABNORMAL HIGH (ref 0–200)
HDL: 43.9 mg/dL (ref >39.00)
LDL Cholesterol: 139 mg/dL — ABNORMAL HIGH (ref 0–99)
NonHDL: 163.56
Total CHOL/HDL Ratio: 5
Triglycerides: 123 mg/dL (ref 0.0–149.0)
VLDL: 24.6 mg/dL (ref 0.0–40.0)

## 2020-06-09 IMAGING — DX DG HAND COMPLETE 3+V*R*
3 series · 3 of 3 positions shown · non-contrast
Comparison: None.

CLINICAL DATA: Metacarpal phalangeal pain.

EXAM:
RIGHT HAND - COMPLETE 3+ VIEW

[hand pa]
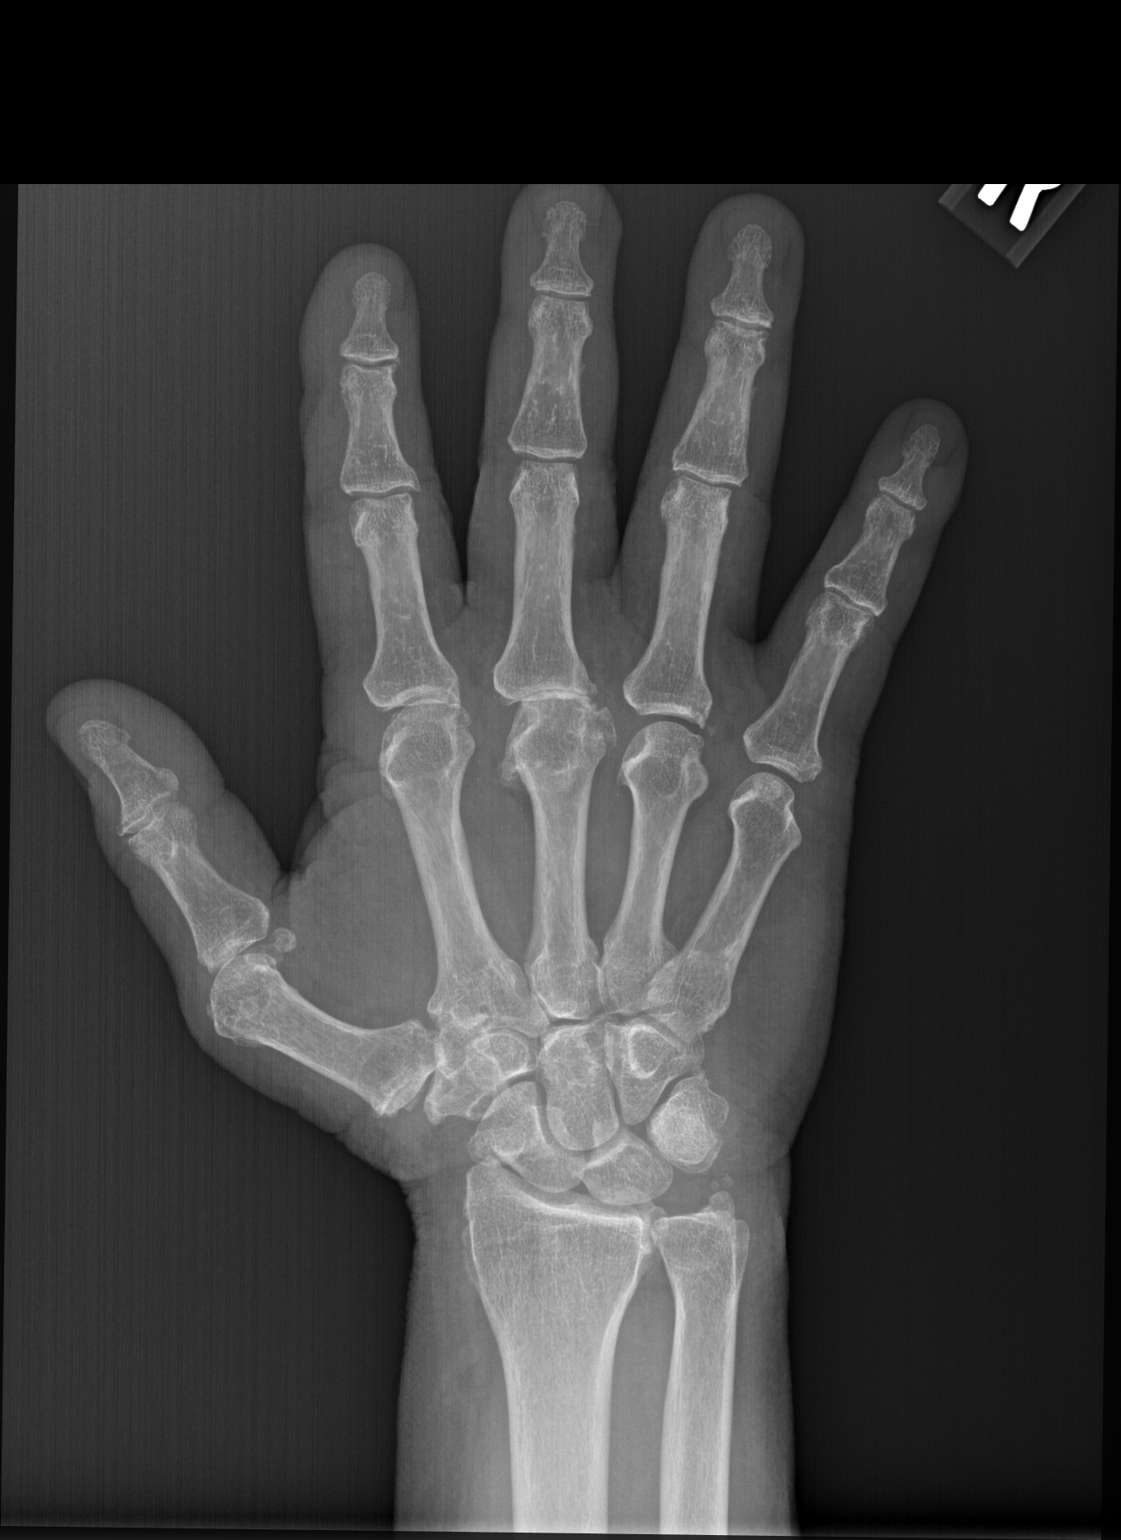

[hand obl (oblique)]
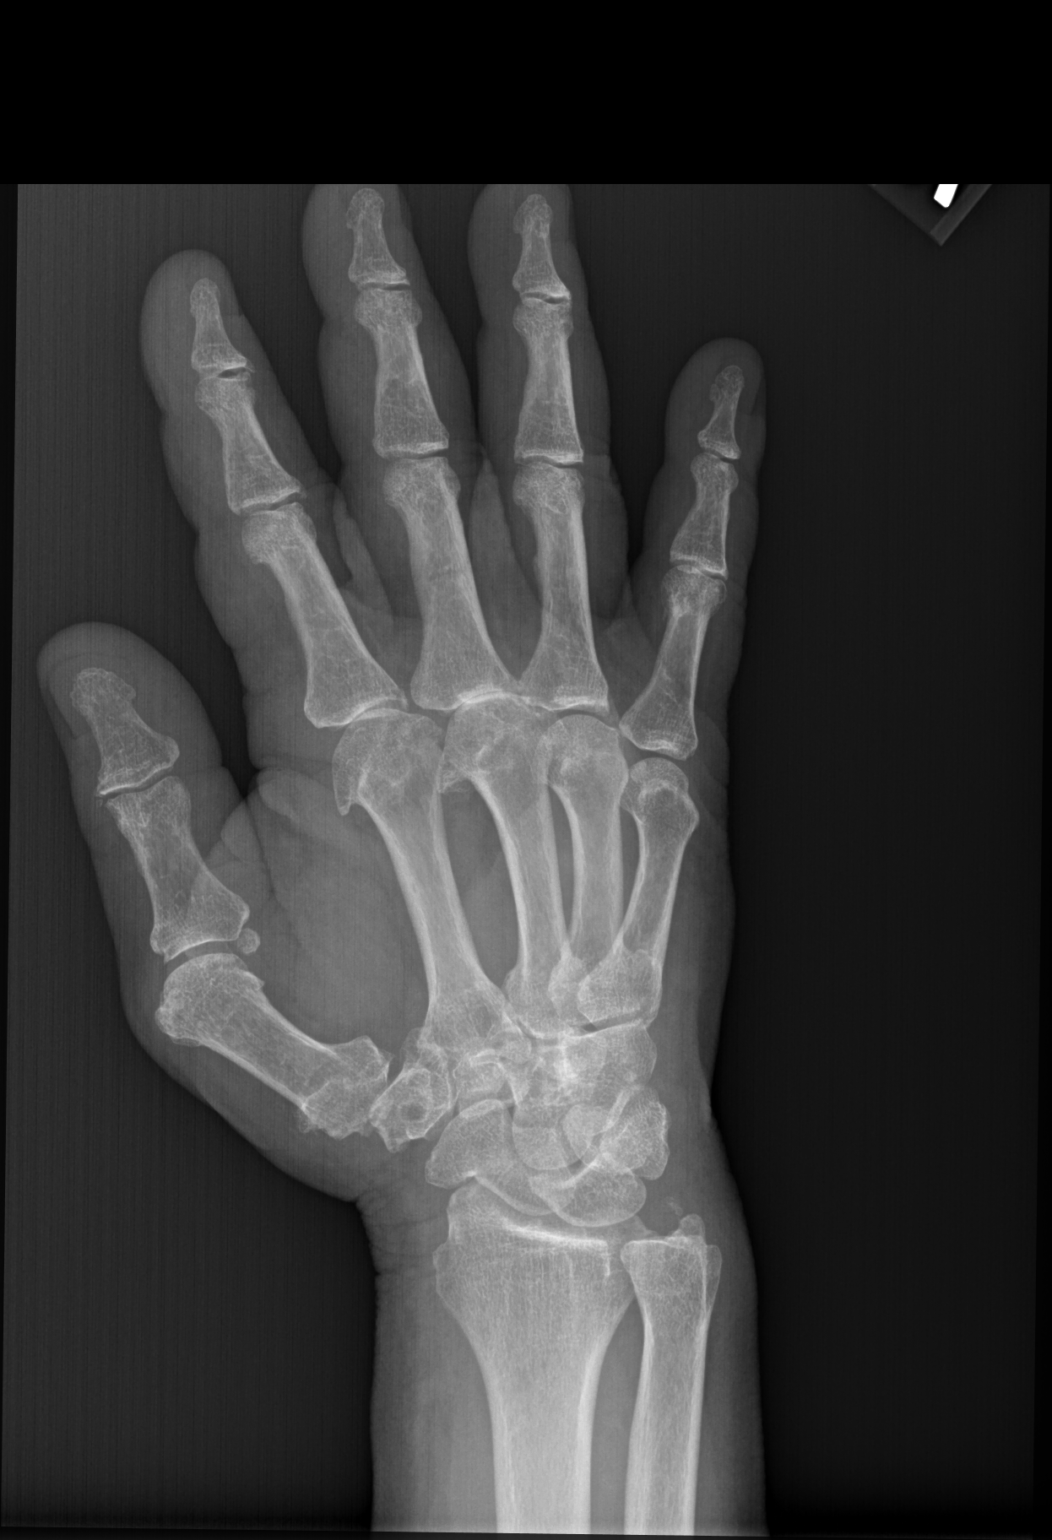

[hand lat]
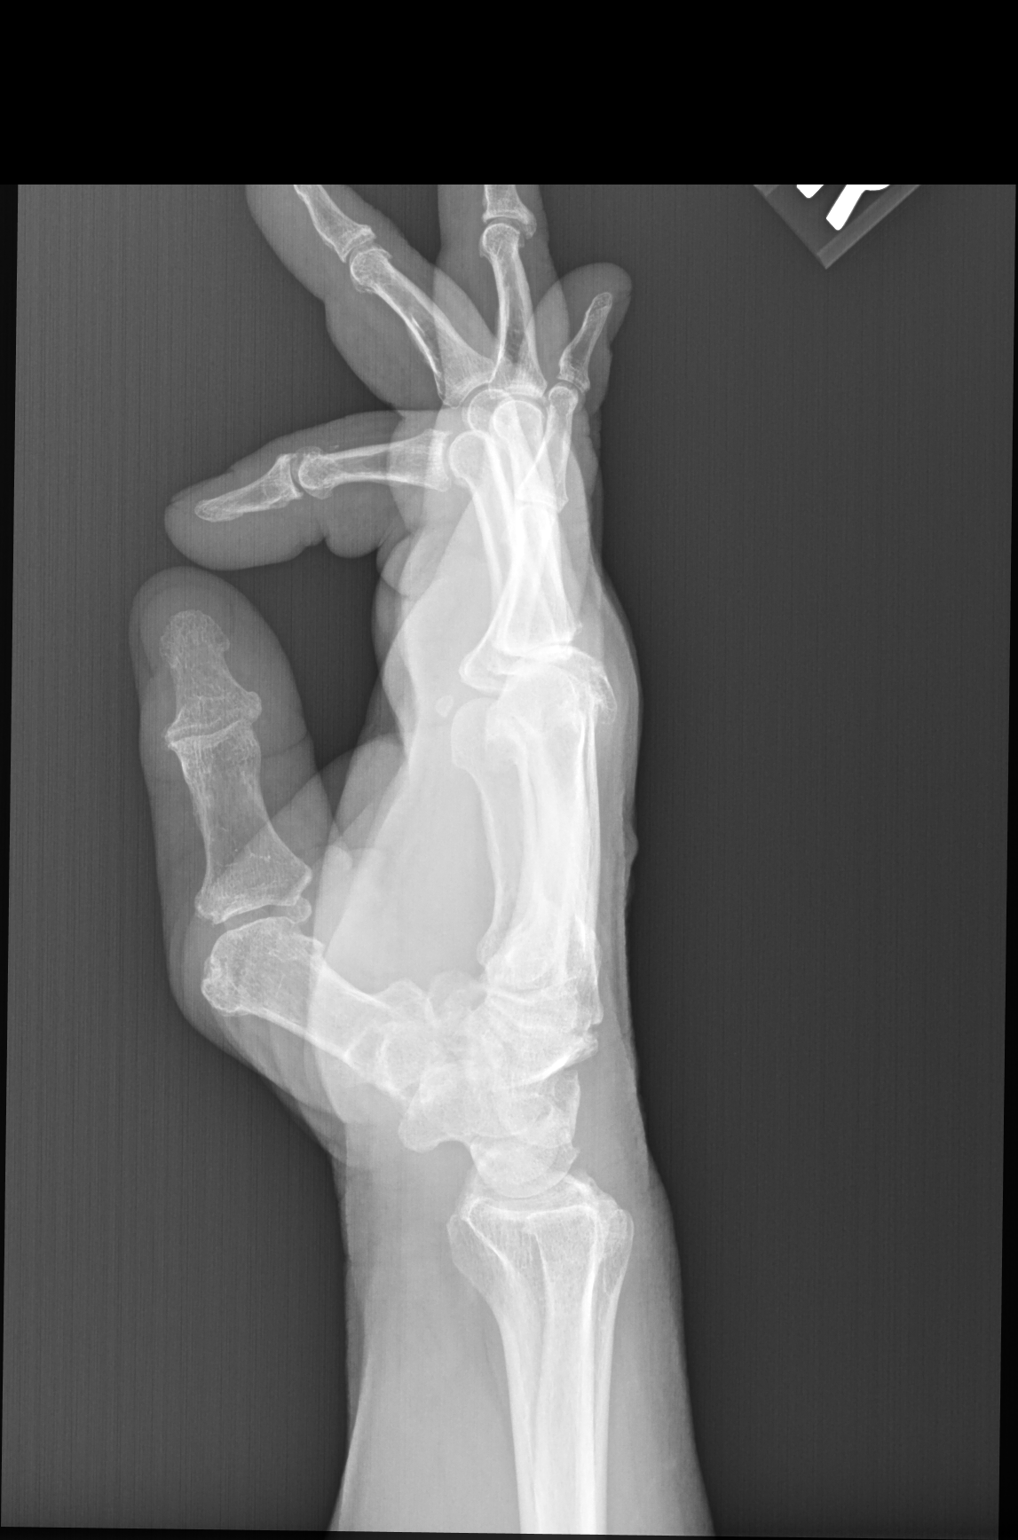

[3 of 3 positions shown; findings below may reference images not displayed]

FINDINGS: Negative for acute fracture

Moderate to advanced degenerative change in the third MCP with joint
space narrowing and spurring. Mild degenerative change in the first
and second MCP. No erosion.

Small soft tissue calcifications adjacent to the ulnar styloid
likely due to old injury. Cyst in the triquetrum. Mild degenerative
change base of thumb.
IMPRESSION: Osteoarthritis most severe in the third MCP.  Negative for fracture

## 2020-06-09 MED ORDER — METOPROLOL SUCCINATE ER 25 MG PO TB24: 25.0000 mg | ORAL_TABLET | Freq: Every day | ORAL | 1 refills | Status: DC

## 2020-06-09 MED ORDER — ZOSTER VAC RECOMB ADJUVANTED 50 MCG/0.5ML IM SUSR: 0.5000 mL | Freq: Once | INTRAMUSCULAR | 1 refills | Status: AC

## 2020-06-09 NOTE — Patient Instructions (Addendum)
The ShingRx vaccine is now available in local pharmacies and is much more protective than the old one  Zostavax  (it is about 97%  Effective in preventing shingles). .  It is therefore ADVISED for all interested adults over 50 to prevent shingles so I have printed you a prescription for it.  (it requires a 2nd dose 2 too 6 months after the first one) .  It will cause you to have flu  like symptoms for 2 days    Return after April 30 for annual PSA ( prostate cancer screening )   You are due for follow up vascular evaluation of renal artery stenosis in May.   The goal   blood pressure is now 120/70 to 130/80,, and you have been above that on the last 3-4 office visits  .  Please check your blood pressure a few times at home and send me the readings so I can determine if you need to start a medication to lower your blood pressure .   Health Maintenance After Age 19 After age 54, you are at a higher risk for certain long-term diseases and infections as well as injuries from falls. Falls are a major cause of broken bones and head injuries in people who are older than age 77. Getting regular preventive care can help to keep you healthy and well. Preventive care includes getting regular testing and making lifestyle changes as recommended by your health care provider. Talk with your health care provider about:  Which screenings and tests you should have. A screening is a test that checks for a disease when you have no symptoms.  A diet and exercise plan that is right for you. What should I know about screenings and tests to prevent falls? Screening and testing are the best ways to find a health problem early. Early diagnosis and treatment give you the best chance of managing medical conditions that are common after age 11. Certain conditions and lifestyle choices may make you more likely to have a fall. Your health care provider may recommend:  Regular vision checks. Poor vision and conditions such as  cataracts can make you more likely to have a fall. If you wear glasses, make sure to get your prescription updated if your vision changes.  Medicine review. Work with your health care provider to regularly review all of the medicines you are taking, including over-the-counter medicines. Ask your health care provider about any side effects that may make you more likely to have a fall. Tell your health care provider if any medicines that you take make you feel dizzy or sleepy.  Osteoporosis screening. Osteoporosis is a condition that causes the bones to get weaker. This can make the bones weak and cause them to break more easily.  Blood pressure screening. Blood pressure changes and medicines to control blood pressure can make you feel dizzy.  Strength and balance checks. Your health care provider may recommend certain tests to check your strength and balance while standing, walking, or changing positions.  Foot health exam. Foot pain and numbness, as well as not wearing proper footwear, can make you more likely to have a fall.  Depression screening. You may be more likely to have a fall if you have a fear of falling, feel emotionally low, or feel unable to do activities that you used to do.  Alcohol use screening. Using too much alcohol can affect your balance and may make you more likely to have a fall. What actions can  I take to lower my risk of falls? General instructions  Talk with your health care provider about your risks for falling. Tell your health care provider if: ? You fall. Be sure to tell your health care provider about all falls, even ones that seem minor. ? You feel dizzy, sleepy, or off-balance.  Take over-the-counter and prescription medicines only as told by your health care provider. These include any supplements.  Eat a healthy diet and maintain a healthy weight. A healthy diet includes low-fat dairy products, low-fat (lean) meats, and fiber from whole grains, beans, and  lots of fruits and vegetables. Home safety  Remove any tripping hazards, such as rugs, cords, and clutter.  Install safety equipment such as grab bars in bathrooms and safety rails on stairs.  Keep rooms and walkways well-lit. Activity  Follow a regular exercise program to stay fit. This will help you maintain your balance. Ask your health care provider what types of exercise are appropriate for you.  If you need a cane or walker, use it as recommended by your health care provider.  Wear supportive shoes that have nonskid soles.   Lifestyle  Do not drink alcohol if your health care provider tells you not to drink.  If you drink alcohol, limit how much you have: ? 0-1 drink a day for women. ? 0-2 drinks a day for men.  Be aware of how much alcohol is in your drink. In the [place] one drink equals one typical bottle of beer (12 oz), one-half glass of wine (5 oz), or one shot of hard liquor (1 oz).  Do not use any products that contain nicotine or tobacco, such as cigarettes and e-cigarettes. If you need help quitting, ask your health care provider. Summary  Having a healthy lifestyle and getting preventive care can help to protect your health and wellness after age 89.  Screening and testing are the best way to find a health problem early and help you avoid having a fall. Early diagnosis and treatment give you the best chance for managing medical conditions that are more common for people who are older than age 30.  Falls are a major cause of broken bones and head injuries in people who are older than age 46. Take precautions to prevent a fall at home.  Work with your health care provider to learn what changes you can make to improve your health and wellness and to prevent falls. This information is not intended to replace advice given to you by your health care provider. Make sure you discuss any questions you have with your health care provider. Document Revised: 07/02/2018  Document Reviewed: 01/22/2017 Elsevier Patient Education  2021 [employer].

## 2020-06-09 NOTE — Telephone Encounter (Signed)
Patient wanted to have labs before physical

## 2020-06-09 NOTE — Progress Notes (Signed)
Patient ID: Frederick Pace., male    DOB: 1950/09/04  Age: 70 y.o. MRN: [id number]  The patient is here for annual PREVENTIVE  examination and management of other chronic and acute problems.   The risk factors are reflected in the social history.  The roster of all physicians providing medical care to patient - is listed in the Snapshot section of the chart.  Activities of daily living:  The patient is 100% independent in all ADLs: dressing, toileting, feeding as well as independent mobility  Home safety : The patient has smoke detectors in the home. They wear seatbelts.  There are no firearms at home. There is no violence in the home.   There is no risks for hepatitis, STDs or HIV. There is no   history of blood transfusion. They have no travel history to infectious disease endemic areas of the world.  The patient has seen their dentist in the last six month. They have seen their eye doctor in the last year. He denies hearing difficulty with regard to whispered voices and some television programs.  They have deferred audiologic testing in the last year.  They do not  have excessive sun exposure. Discussed the need for sun protection: hats, long sleeves and use of sunscreen if there is significant sun exposure.   Diet: the importance of a healthy diet is discussed. They do have a healthy diet.  The benefits of regular aerobic exercise were discussed. He does not exercise regularly but is physically active. .   Depression screen: there are no signs or vegative symptoms of depression- irritability, change in appetite, anhedonia, sadness/tearfullness.  Cognitive assessment: the patient manages all their financial and personal affairs and is actively engaged. They could relate day,date,year and events; recalled 2/3 objects at 3 minutes; performed clock-face test normally.  The following portions of the patient's history were reviewed and updated as appropriate: allergies, current medications,  past family history, past medical history,  past surgical history, past social history  and problem list.  Visual acuity was not assessed per patient preference since she has regular follow up with her ophthalmologist. Hearing and body mass index were assessed and reviewed.   During the course of the visit the patient was educated and counseled about appropriate screening and preventive services including : fall prevention , diabetes screening, nutrition counseling, colorectal cancer screening, and recommended immunizations.    CC: The primary encounter diagnosis was Essential hypertension. Diagnoses of Hand pain, right, Renal artery stenosis (HCC), Vitamin D deficiency, Obesity (BMI 30-39.9), Diuretic-induced hypokalemia, Mixed hyperlipidemia, Visit for preventive health examination, Cervical disc disorder, Arthritis of right hand, Abnormal finding on diagnostic imaging of left kidney, Vaccine counseling, White coat syndrome with hypertension, and Prostate cancer screening were also pertinent to this visit.  1) mother Frederick Pace passed since his last visit   2) bilateral MCP pain ,  Right greater than left  Mainly involving the  Index and middle finger.  Cannot close the hand into a fist.  Prior trauma from "punching people."    Denies trouble managing anger but has been harassed by phone callers recently and aggravated by current situation .  3) HTN:  Does not check it at home . On 3 medications .    Asked to check it at home.   4)  Uses readers.[name] over for annual eye exam and does not want hearing test.   5) denies depressive symptoms .  "Im just grumpy."     History Frederick Pace has  a past medical history of HTN (hypertension) and Other and unspecified hyperlipidemia.   He has a past surgical history that includes Anterior cruciate ligament repair (Left, 2004) and RENAL ANGIOGRAPHY (Right, 01/08/2018).   His family history includes Aortic aneurysm in his father; Dementia in his mother; Heart  disease in his paternal uncle; Heart disease (age of onset: 33) in his father; Hyperlipidemia in an other family member; Mitral valve prolapse in his father.He reports that he has never smoked. He has never used smokeless tobacco. He reports current alcohol use. He reports that he does not use drugs.  Outpatient Medications Prior to Visit  Medication Sig Dispense Refill  . amLODipine (NORVASC) 10 MG tablet TAKE 1 TABLET BY MOUTH DAILY 90 tablet 1  . losartan (COZAAR) 100 MG tablet TAKE ONE TABLET EVERY DAY 90 tablet 1  . Red Yeast Rice Extract 600 MG CAPS Take 1 capsule by mouth 2 (two) times daily.     . metoprolol succinate (TOPROL-XL) 25 MG 24 hr tablet TAKE 1 TABLET BY MOUTH DAILY 90 tablet 1  . doxycycline (VIBRA-TABS) 100 MG tablet Take 1 tablet (100 mg total) by mouth 2 (two) times daily. (Patient not taking: No sig reported) 20 tablet 0  . spironolactone (ALDACTONE) 50 MG tablet Take 1 tablet (50 mg total) by mouth daily as needed (fluid retention). (Patient not taking: Reported on 06/09/2020) 30 tablet 2   No facility-administered medications prior to visit.    Review of Systems   Patient denies headache, fevers, malaise, unintentional weight loss, skin rash, eye pain, sinus congestion and sinus pain, sore throat, dysphagia,  hemoptysis , cough, dyspnea, wheezing, chest pain, palpitations, orthopnea, edema, abdominal pain, nausea, melena, diarrhea, constipation, flank pain, dysuria, hematuria, urinary  Frequency, nocturia, numbness, tingling, seizures,  Focal weakness, Loss of consciousness,  Tremor, insomnia, depression, anxiety, and suicidal ideation.       Objective:  BP (!) 146/72 (BP Location: Left Arm, Patient Position: Sitting, Cuff Size: Normal)   Pulse (!) 41   Temp 98 F (36.7 C) (Oral)   Resp 15   Ht 5\' 4"  (1.626 m)   Wt 183 lb 6.4 oz (83.2 kg)   SpO2 98%   BMI 31.48 kg/m   Physical Exam  General appearance: alert, cooperative and appears stated age Ears: normal  TM's and external ear canals both ears Throat: lips, mucosa, and tongue normal; teeth and gums normal Neck: no adenopathy, no carotid bruit, supple, symmetrical, trachea midline and thyroid not enlarged, symmetric, no tenderness/mass/nodules Back: symmetric, no curvature. ROM normal. No CVA tenderness. Lungs: clear to auscultation bilaterally Heart: regular rate and rhythm, S1, S2 normal, no murmur, click, rub or gallop Abdomen: soft, non-tender; bowel sounds normal; no masses,  no organomegaly Pulses: 2+ and symmetric Ext: fingers are uniformly thick but without synovitis or swelling/  Skin: Skin color, texture, turgor normal. No rashes or lesions Lymph nodes: Cervical, supraclavicular, and axillary nodes normal.   Assessment & Plan:   Problem List Items Addressed This Visit      Unprioritized   Abnormal finding on diagnostic imaging of left kidney            Arthritis of right hand    He has fusiform swelling of all fingers without synovitis.  Plain films have not been reported yet,  But loss of joint space at the MCPS of the index and middle finger is noted along with spurring,  But no erosions are appreciated   Lab Results  Component Value  Date   LABURIC 7.6 06/09/2020         Cervical disc disorder    He has mild thenar wasting on the right,  But no loss of strength.  Conservative treatment recommended      Diuretic-induced hypokalemia    Stopping maxzide due to recurrent hypokalemia . Spironolactone was started but patient is no longer taking it. Will resume if home BPs are not at goal/   Lab Results  Component Value Date   NA 138 06/09/2020   K 4.5 06/09/2020   CL 100 06/09/2020   CO2 27 06/09/2020         Essential hypertension - Primary    Well controlled on current regimen of Toprol XL, 25 mg , amlodipine 10 mg and losartan 100 mg daily not taking spironolactone (prescribed because of diuretic induced hypokalemia in the past).. Renal function stable, no  changes today.  Lab Results  Component Value Date   CREATININE 1.14 06/09/2020   Lab Results  Component Value Date   NA 138 06/09/2020   K 4.5 06/09/2020   CL 100 06/09/2020   CO2 27 06/09/2020         Relevant Medications   metoprolol succinate (TOPROL-XL) 25 MG 24 hr tablet   Other Relevant Orders   Microalbumin / creatinine urine ratio (Completed)   Mixed hyperlipidemia    His 10 yr risk using FRC was  31% in 2021 and statin therapy was strongly advised but deferred by patient in favor of using RYR.  His LDL has increased by nearly 30 pts, and he has  Atherosclerosis causing non critical stenosis .  Statin therapy again strongly advised.   Lab Results  Component Value Date   CHOL 207 (H) 06/09/2020   HDL 43.90 06/09/2020   LDLCALC 139 (H) 06/09/2020   LDLDIRECT 135.0 07/15/2018   TRIG 123.0 06/09/2020   CHOLHDL 5 06/09/2020         Relevant Medications   metoprolol succinate (TOPROL-XL) 25 MG 24 hr tablet   Obesity (BMI 30-39.9)    I have addressed  BMI and recommended wt loss of 10% of body weigh over the next 6 months using a low glycemic index diet and regular exercise a minimum of 5 days per week.        Prostate cancer screening (Chronic)   Relevant Orders   PSA, Medicare   Renal artery stenosis (HCC)    noncritical by last doppler  In 2020 (1-59%) .  Bi annual repeats are recommended.  Statin therapy will be advised        Relevant Medications   metoprolol succinate (TOPROL-XL) 25 MG 24 hr tablet   Vaccine counseling    He declines Zostavax , Pneumonia and Influenza vaccines,  But has received the COVID 19 vaccines  And is up to date on TdaP      Visit for preventive health examination    age appropriate education and counseling updated, referrals for preventative services and immunizations addressed, dietary and smoking counseling addressed, most recent labs reviewed.  I have personally reviewed and have noted:  1) the patient's medical and  social history 2) The pt's use of alcohol, tobacco, and illicit drugs 3) The patient's current medications and supplements 4) Functional ability including ADL's, fall risk, home safety risk, hearing and visual impairment 5) Diet and physical activities 6) Evidence for depression or mood disorder 7) The patient's height, weight, and BMI have been recorded in the chart  I have made  referrals, and provided counseling and education based on review of the above      White coat syndrome with hypertension    he reports compliance with medication regimen  but has an elevated reading today in office.  he is not using NSAIDs daily.  Discussed goal of 120/70  (130/80 for patients over 70)  to preserve renal function.  he has been asked to check her  BP  at home and  submit readings for evaluation. Renal function, electrolytes and screen for proteinuria are all normal      Relevant Medications   metoprolol succinate (TOPROL-XL) 25 MG 24 hr tablet    Other Visit Diagnoses    Hand pain, right       Relevant Orders   DG Hand Complete Right   Uric acid (Completed)   Vitamin D deficiency          I have changed Frederick Emery Jr.'s metoprolol succinate. I am also having him start on Zoster Vaccine Adjuvanted. Additionally, I am having him maintain his Red Yeast Rice Extract, doxycycline, spironolactone, amLODipine, and losartan.  Meds ordered this encounter  Medications  . Zoster Vaccine Adjuvanted [facility]) injection    Sig: Inject 0.5 mLs into the muscle once for 1 dose.    Dispense:  1 each    Refill:  1  . metoprolol succinate (TOPROL-XL) 25 MG 24 hr tablet    Sig: Take 1 tablet (25 mg total) by mouth daily.    Dispense:  90 tablet    Refill:  1    Medications Discontinued During This Encounter  Medication Reason  . metoprolol succinate (TOPROL-XL) 25 MG 24 hr tablet     Follow-up: No follow-ups on file.   Sherlene Shams, MD

## 2020-06-10 NOTE — Progress Notes (Incomplete)
Patient ID: Frederick Pace., male    DOB: [birth year]  Age: 70 y.o. MRN: [id number]  The patient is here for annual PREVENTIVE  examination and management of other chronic and acute problems.   The risk factors are reflected in the social history.  The roster of all physicians providing medical care to patient - is listed in the Snapshot section of the chart.  Activities of daily living:  The patient is 100% independent in all ADLs: dressing, toileting, feeding as well as independent mobility  Home safety : The patient has smoke detectors in the home. They wear seatbelts.  There are no firearms at home. There is no violence in the home.   There is no risks for hepatitis, STDs or HIV. There is no   history of blood transfusion. They have no travel history to infectious disease endemic areas of the world.  The patient has seen their dentist in the last six month. They have seen their eye doctor in the last year. He denies hearing difficulty with regard to whispered voices and some television programs.  They have deferred audiologic testing in the last year.  They do not  have excessive sun exposure. Discussed the need for sun protection: hats, long sleeves and use of sunscreen if there is significant sun exposure.   Diet: the importance of a healthy diet is discussed. They do have a healthy diet.  The benefits of regular aerobic exercise were discussed. He does not exercise regularly but is physically active. .   Depression screen: there are no signs or vegative symptoms of depression- irritability, change in appetite, anhedonia, sadness/tearfullness.  Cognitive assessment: the patient manages all their financial and personal affairs and is actively engaged. They could relate day,date,year and events; recalled 2/3 objects at 3 minutes; performed clock-face test normally.  The following portions of the patient's history were reviewed and updated as appropriate: allergies, current medications,  past family history, past medical history,  past surgical history, past social history  and problem list.  Visual acuity was not assessed per patient preference since she has regular follow up with her ophthalmologist. Hearing and body mass index were assessed and reviewed.   During the course of the visit the patient was educated and counseled about appropriate screening and preventive services including : fall prevention , diabetes screening, nutrition counseling, colorectal cancer screening, and recommended immunizations.    CC: There were no encounter diagnoses.  1) mother [name] passed since his last visit   2) bilateral MCP pain ,  Right greater than left  Mainly involving the  Index and middle finger.  Cannot close the hand into a fist. Prior trauma from "punching people."    Denies trouble managing anger but has been harassed by phone callers recently and aggravated by current situation .  3) HTN:  Does not check it at home . On 3 medications .    Asked to check it at home.   4)  Uses readers.[name] over for annual eye exam and does not want hearing test.   5) denies depressive symptoms .  "Im just grumpy."     History Talley has a past medical history of HTN (hypertension) and Other and unspecified hyperlipidemia.   He has a past surgical history that includes Anterior cruciate ligament repair (Left, 2004) and RENAL ANGIOGRAPHY (Right, 01/08/2018).   His family history includes Aortic aneurysm in his father; Dementia in his mother; Heart disease in his paternal uncle; Heart disease (age of onset:  90) in his father; Hyperlipidemia in an other family member; Mitral valve prolapse in his father.He reports that he has never smoked. He has never used smokeless tobacco. He reports current alcohol use. He reports that he does not use drugs.  Outpatient Medications Prior to Visit  Medication Sig Dispense Refill  . amLODipine (NORVASC) 10 MG tablet TAKE 1 TABLET BY MOUTH DAILY 90 tablet  1  . losartan (COZAAR) 100 MG tablet TAKE ONE TABLET EVERY DAY 90 tablet 1  . metoprolol succinate (TOPROL-XL) 25 MG 24 hr tablet TAKE 1 TABLET BY MOUTH DAILY 90 tablet 1  . Red Yeast Rice Extract 600 MG CAPS Take 1 capsule by mouth 2 (two) times daily.     [name] doxycycline (VIBRA-TABS) 100 MG tablet Take 1 tablet (100 mg total) by mouth 2 (two) times daily. (Patient not taking: No sig reported) 20 tablet 0  . spironolactone (ALDACTONE) 50 MG tablet Take 1 tablet (50 mg total) by mouth daily as needed (fluid retention). (Patient not taking: Reported on 06/09/2020) 30 tablet 2   No facility-administered medications prior to visit.    Review of Systems  Objective:  BP (!) 146/72 (BP Location: Left Arm, Patient Position: Sitting, Cuff Size: Normal)   Pulse (!) 41   Temp 98 F (36.7 C) (Oral)   Resp 15   Ht 5\' 4"  (1.626 m)   Wt 183 lb 6.4 oz (83.2 kg)   SpO2 98%   BMI 31.48 kg/m   Physical Exam    Assessment & Plan:   Problem List Items Addressed This Visit   None     I am having . maintain his Red Yeast Rice Extract, doxycycline, spironolactone, amLODipine, metoprolol succinate, and losartan.  No orders of the defined types were placed in this encounter.   There are no discontinued medications.  Follow-up: No follow-ups on file.   Mikki Harbor, MD

## 2020-06-11 ENCOUNTER — Encounter: Payer: Self-pay | Admitting: Internal Medicine

## 2020-06-11 DIAGNOSIS — M19041 Primary osteoarthritis, right hand: Secondary | ICD-10-CM | POA: Insufficient documentation

## 2020-06-11 DIAGNOSIS — I10 Essential (primary) hypertension: Secondary | ICD-10-CM | POA: Insufficient documentation

## 2020-06-11 MED ORDER — ATORVASTATIN CALCIUM 20 MG PO TABS
20.0000 mg | ORAL_TABLET | Freq: Every day | ORAL | 3 refills | Status: DC
Start: 2020-06-11 — End: 2021-07-17

## 2020-06-11 NOTE — Assessment & Plan Note (Addendum)
His 10 yr risk using FRC was  31% in 2021 and statin therapy was strongly advised but deferred by patient in favor of using RYR.  His LDL has increased by nearly 30 pts, and he has  Atherosclerosis causing non critical stenosis .  Statin therapy again strongly advised.   Lab Results  Component Value Date   CHOL 207 (H) 06/09/2020   HDL 43.90 06/09/2020   LDLCALC 139 (H) 06/09/2020   LDLDIRECT 135.0 07/15/2018   TRIG 123.0 06/09/2020   CHOLHDL 5 06/09/2020

## 2020-06-11 NOTE — Assessment & Plan Note (Signed)
noncritical by last doppler  In 2020 (1-59%) .  Bi annual repeats are recommended.  Statin therapy will be advised

## 2020-06-11 NOTE — Assessment & Plan Note (Signed)
He has fusiform swelling of all fingers without synovitis.  Plain films have not been reported yet,  But loss of joint space at the MCPS of the index and middle finger is noted along with spurring,  But no erosions are appreciated   Lab Results  Component Value Date   LABURIC 7.6 06/09/2020

## 2020-06-11 NOTE — Assessment & Plan Note (Signed)
Incidental finding during workup for hypertension.  He has no significant RAS ,  But a follow duplex every 2 years has been recommended by Dr Wyn Quaker.

## 2020-06-11 NOTE — Assessment & Plan Note (Signed)
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 Spironolactone was started but patient is no longer taking it. Will resume if home BPs are not at goal/   Lab Results  Component Value Date   NA 138 06/09/2020   K 4.5 06/09/2020   CL 100 06/09/2020   CO2 27 06/09/2020

## 2020-06-11 NOTE — Assessment & Plan Note (Signed)
I have addressed  BMI and recommended wt loss of 10% of body weigh over the next 6 months using a low glycemic index diet and regular exercise a minimum of 5 days per week.   

## 2020-06-11 NOTE — Assessment & Plan Note (Addendum)
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 Renal function stable, no changes today.  Lab Results  Component Value Date   CREATININE 1.14 06/09/2020   Lab Results  Component Value Date   NA 138 06/09/2020   K 4.5 06/09/2020   CL 100 06/09/2020   CO2 27 06/09/2020

## 2020-06-11 NOTE — Assessment & Plan Note (Signed)
He has mild thenar wasting on the right,  But no loss of strength.  Conservative treatment recommended

## 2020-06-11 NOTE — Assessment & Plan Note (Signed)

## 2020-06-11 NOTE — Assessment & Plan Note (Signed)
he reports compliance with medication regimen  but has an elevated reading today in office.  he is not using NSAIDs daily.  Discussed goal of 120/70  (130/80 for patients over 70)  to preserve renal function.  he has been asked to check her  BP  at home and  submit readings for evaluation. Renal function, electrolytes and screen for proteinuria are all normal

## 2020-06-11 NOTE — Assessment & Plan Note (Addendum)
He declines Zostavax , Pneumonia and Influenza vaccines,  But has received the COVID 19 vaccines  And is up to date on TdaP

## 2020-06-13 ENCOUNTER — Ambulatory Visit (INDEPENDENT_AMBULATORY_CARE_PROVIDER_SITE_OTHER): Payer: Medicare Other

## 2020-06-13 VITALS — Ht 64.0 in | Wt 183.0 lb

## 2020-06-13 DIAGNOSIS — Z0000 Encounter for general adult medical examination without abnormal findings: Secondary | ICD-10-CM

## 2020-06-13 NOTE — Progress Notes (Signed)
Subjective:   Frederick Pace. is a 69 y.o. male who presents for Medicare Annual/Subsequent preventive examination.  Review of Systems    No ROS.  Medicare Wellness Virtual Visit.   Cardiac Risk Factors include: advanced age (>59men, >73 women);hypertension;male gender     Objective:    Today's Vitals   06/13/20 1104  Weight: 183 lb (83 kg)  Height: 5\' 4"  (1.626 m)   Body mass index is 31.41 kg/m.  Advanced Directives 06/13/2020 06/11/2019 06/05/2018  Does Patient Have a Medical Advance Directive? No No No  Would patient like information on creating a medical advance directive? No - Patient declined No - Patient declined No - Patient declined    Current Medications (verified) Outpatient Encounter Medications as of 06/13/2020  Medication Sig  . amLODipine (NORVASC) 10 MG tablet TAKE 1 TABLET BY MOUTH DAILY  . atorvastatin (LIPITOR) 20 MG tablet Take 1 tablet (20 mg total) by mouth daily.  06/15/2020 doxycycline (VIBRA-TABS) 100 MG tablet Take 1 tablet (100 mg total) by mouth 2 (two) times daily. (Patient not taking: No sig reported)  . losartan (COZAAR) 100 MG tablet TAKE ONE TABLET EVERY DAY  . metoprolol succinate (TOPROL-XL) 25 MG 24 hr tablet Take 1 tablet (25 mg total) by mouth daily.  . Red Yeast Rice Extract 600 MG CAPS Take 1 capsule by mouth 2 (two) times daily.   [name] spironolactone (ALDACTONE) 50 MG tablet Take 1 tablet (50 mg total) by mouth daily as needed (fluid retention). (Patient not taking: Reported on 06/09/2020)   No facility-administered encounter medications on file as of 06/13/2020.    Allergies (verified) Patient has no known allergies.   History: Past Medical History:  Diagnosis Date  . HTN (hypertension)   . Other and unspecified hyperlipidemia    Past Surgical History:  Procedure Laterality Date  . ANTERIOR CRUCIATE LIGAMENT REPAIR Left 2004  . RENAL ANGIOGRAPHY Right 01/08/2018   Procedure: RENAL ANGIOGRAPHY;  Surgeon: 01/10/2018, MD;  Location:  [facility] INVASIVE CV LAB;  Service: Cardiovascular;  Laterality: Right;   Family History  Problem Relation Age of Onset  . Dementia Mother   . Heart disease Father 70       CABG  . Mitral valve prolapse Father   . Aortic aneurysm Father   . Hyperlipidemia Other        family Hx  . Heart disease Paternal Uncle        Valve replacements   Social History   Socioeconomic History  . Marital status: Single    Spouse name: Not on file  . Number of children: Not on file  . Years of education: Not on file  . Highest education level: Not on file  Occupational History  . Not on file  Tobacco Use  . Smoking status: Never Smoker  . Smokeless tobacco: Never Used  Vaping Use  . Vaping Use: Never used  Substance and Sexual Activity  . Alcohol use: Yes    Alcohol/week: 0.0 standard drinks    Comment: 1 drink every few months  . Drug use: No    Comment: Refused to answer  . Sexual activity: Not on file  Other Topics Concern  . Not on file  Social History Narrative  . Not on file   Social Determinants of Health   Financial Resource Strain: Low Risk   . Difficulty of Paying Living Expenses: Not hard at all  Food Insecurity: No Food Insecurity  . Worried About 43  of Food in the Last Year: Never true  . Ran Out of Food in the Last Year: Never true  Transportation Needs: No Transportation Needs  . Lack of Transportation (Medical): No  . Lack of Transportation (Non-Medical): No  Physical Activity: Unknown  . Days of Exercise per Week: 0 days  . Minutes of Exercise per Session: Not on file  Stress: No Stress Concern Present  . Feeling of Stress : Not at all  Social Connections: Unknown  . Frequency of Communication with Friends and Family: More than three times a week  . Frequency of Social Gatherings with Friends and Family: Not on file  . Attends Religious Services: Not on file  . Active Member of Clubs or Organizations: Not on file  . Attends Banker Meetings:  Not on file  . Marital Status: Not on file    Tobacco Counseling Counseling given: Not Answered   Clinical Intake:  Pre-visit preparation completed: Yes        Diabetes: No  How often do you need to have someone help you when you read instructions, pamphlets, or other written materials from your doctor or pharmacy?: 1 - Never    Interpreter Needed?: No      Activities of Daily Living In your present state of health, do you have any difficulty performing the following activities: 06/13/2020  Hearing? N  Vision? N  Difficulty concentrating or making decisions? N  Walking or climbing stairs? N  Dressing or bathing? N  Doing errands, shopping? N  Preparing Food and eating ? N  Using the Toilet? N  In the past six months, have you accidently leaked urine? N  Do you have problems with loss of bowel control? N  Managing your Medications? N  Managing your Finances? N  Housekeeping or managing your Housekeeping? N  Some recent data might be hidden    Patient Care Team: Sherlene Shams, MD as PCP - General (Internal Medicine)  Indicate any recent Medical Services you may have received from other than [facility] providers in the past year (date may be approximate).     Assessment:   This is a routine wellness examination for [facility].  I connected with [name] today by telephone and verified that I am speaking with the correct person using two identifiers. Location patient: home Location provider: work Persons participating in the virtual visit: patient, Engineer, civil (consulting).    I discussed the limitations, risks, security and privacy concerns of performing an evaluation and management service by telephone and the availability of in person appointments. The patient expressed understanding and verbally consented to this telephonic visit.    Interactive audio and video telecommunications were attempted between this provider and patient, however failed, due to patient having technical  difficulties OR patient did not have access to video capability.  We continued and completed visit with audio only.  Some vital signs may be absent or patient reported.   Hearing/Vision screen  Hearing Screening   125Hz  250Hz  500Hz  1000Hz  2000Hz  3000Hz  4000Hz  6000Hz  8000Hz   Right ear:           Left ear:           Comments: Patient is able to hear conversational tones without difficulty.  No issues reported.  Vision Screening Comments: Visual acuity not assessed, virtual visit. They have seen their ophthalmologist.   Dietary issues and exercise activities discussed: Current Exercise Habits: The patient does not participate in regular exercise at present  Regular diet  Goals    .  DIET - INCREASE LEAN PROTEINS     Low carb foods Healthy snack Portion control      Depression Screen PHQ 2/9 Scores 06/13/2020 06/09/2020 06/11/2019 06/05/2018 11/21/2017 09/15/2016  PHQ - 2 Score 0 0 0 0 0 0  PHQ- 9 Score 0 1 - - 0 -    Fall Risk Fall Risk  06/13/2020 06/09/2020 07/23/2019 06/11/2019 06/05/2018  Falls in the past year? 0 0 0 0 0  Number falls in past yr: 0 - - - -  Injury with Fall? 0 - - - -  Follow up Falls evaluation completed Falls evaluation completed Falls evaluation completed Falls prevention discussed -   ASSISTIVE DEVICES UTILIZED TO PREVENT FALLS: Life alert? No  Use of a cane, walker or w/c? No   TIMED UP AND GO: Was the test performed? No . Virtual visit.  Cognitive Function: MMSE - Mini Mental State Exam 06/11/2019  Not completed: Unable to complete     6CIT Screen 06/13/2020 06/11/2019 06/05/2018  What Year? 0 points 0 points 0 points  What month? 0 points 0 points 0 points  What time? 0 points 0 points 0 points  Count back from 20 0 points - 0 points  Months in reverse 0 points - 0 points  Repeat phrase 0 points - 0 points  Total Score 0 - 0    Immunizations Immunization History  Administered Date(s) Administered  . Moderna Sars-Covid-2 Vaccination 06/18/2019,  07/16/2019, 02/15/2020  . Tdap 12/14/2014   PNA vaccine- declined.   Health Maintenance Health Maintenance  Topic Date Due  . PNA vac Low Risk Adult (1 of 2 - PCV13) Never done  . COLONOSCOPY (Pts 45-44yrs Insurance coverage will need to be confirmed)  12/18/2018  . INFLUENZA VACCINE  07/26/2020 (Originally 10/24/2019)  . TETANUS/TDAP  12/13/2024  . COVID-19 Vaccine  Completed  . Hepatitis C Screening  Completed  . HPV VACCINES  Aged Out   Colonoscopy/Cologuard- declined.  Lung Cancer Screening: (Low Dose CT Chest recommended if Age 75-80 years, 30 pack-year currently smoking OR have quit w/in 15years.) does not qualify.   Dental Screening: Recommended annual dental exams for proper oral hygiene.  Community Resource Referral / Chronic Care Management: CRR required this visit?  No   CCM required this visit?  No      Plan:   Keep all routine maintenance appointments.   Cpe 06/15/20   I have personally reviewed and noted the following in the patient's chart:   . Medical and social history . Use of alcohol, tobacco or illicit drugs  . Current medications and supplements- no opioid use . Functional ability and status . Nutritional status . Physical activity . Advanced directives . List of other physicians . Hospitalizations, surgeries, and ER visits in previous 12 months . Vitals . Screenings to include cognitive, depression, and falls . Referrals and appointments  In addition, I have reviewed and discussed with patient certain preventive protocols, quality metrics, and best practice recommendations. A written personalized care plan for preventive services as well as general preventive health recommendations were provided to patient via mail.     Ashok Pall, LPN   [id number]

## 2020-06-13 NOTE — Patient Instructions (Addendum)
[name] , Thank you for taking time to come for your Medicare Wellness Visit. I appreciate your ongoing commitment to your health goals. Please review the following plan we discussed and let me know if I can assist you in the future.   These are the goals we discussed: Goals    . DIET - INCREASE LEAN PROTEINS     Low carb foods Healthy snack Portion control       This is a list of the screening recommended for you and due dates:  Health Maintenance  Topic Date Due  . Pneumonia vaccines (1 of 2 - PCV13) Never done  . Colon Cancer Screening  12/18/2018  . Flu Shot  07/26/2020*  . Tetanus Vaccine  12/13/2024  . COVID-19 Vaccine  Completed  .  Hepatitis C: One time screening is recommended by Center for Disease Control  (CDC) for  adults born from [birth year] through 1963.   Completed  . HPV Vaccine  Aged Out  *Topic was postponed. The date shown is not the original due date.    Immunizations Immunization History  Administered Date(s) Administered  . Moderna Sars-Covid-2 Vaccination 06/18/2019, 07/16/2019, 02/15/2020  . Tdap 12/14/2014   Keep all routine maintenance appointments.   Cpe 06/15/20   Advanced directives: not yet completed  Conditions/risks identified: none new  Follow up in one year for your annual wellness visit.   Preventive Care 3 Years and Older, Male Preventive care refers to lifestyle choices and visits with your health care provider that can promote health and wellness. What does preventive care include?  A yearly physical exam. This is also called an annual well check.  Dental exams once or twice a year.  Routine eye exams. Ask your health care provider how often you should have your eyes checked.  Personal lifestyle choices, including:  Daily care of your teeth and gums.  Regular physical activity.  Eating a healthy diet.  Avoiding tobacco and drug use.  Limiting alcohol use.  Practicing safe sex.  Taking low doses of aspirin every  day.  Taking vitamin and mineral supplements as recommended by your health care provider. What happens during an annual well check? The services and screenings done by your health care provider during your annual well check will depend on your age, overall health, lifestyle risk factors, and family history of disease. Counseling  Your health care provider may ask you questions about your:  Alcohol use.  Tobacco use.  Drug use.  Emotional well-being.  Home and relationship well-being.  Sexual activity.  Eating habits.  History of falls.  Memory and ability to understand (cognition).  Work and work Astronomer. Screening  You may have the following tests or measurements:  Height, weight, and BMI.  Blood pressure.  Lipid and cholesterol levels. These may be checked every 5 years, or more frequently if you are over 6 years old.  Skin check.  Lung cancer screening. You may have this screening every year starting at age 77 if you have a 30-pack-year history of smoking and currently smoke or have quit within the past 15 years.  Fecal occult blood test (FOBT) of the stool. You may have this test every year starting at age 23.  Flexible sigmoidoscopy or colonoscopy. You may have a sigmoidoscopy every 5 years or a colonoscopy every 10 years starting at age 4.  Prostate cancer screening. Recommendations will vary depending on your family history and other risks.  Hepatitis C blood test.  Hepatitis B blood test.  Sexually transmitted disease (STD) testing.  Diabetes screening. This is done by checking your blood sugar (glucose) after you have not eaten for a while (fasting). You may have this done every 1-3 years.  Abdominal aortic aneurysm (AAA) screening. You may need this if you are a current or former smoker.  Osteoporosis. You may be screened starting at age 96 if you are at high risk. Talk with your health care provider about your test results, treatment options,  and if necessary, the need for more tests. Vaccines  Your health care provider may recommend certain vaccines, such as:  Influenza vaccine. This is recommended every year.  Tetanus, diphtheria, and acellular pertussis (Tdap, Td) vaccine. You may need a Td booster every 10 years.  Zoster vaccine. You may need this after age 45.  Pneumococcal 13-valent conjugate (PCV13) vaccine. One dose is recommended after age 46.  Pneumococcal polysaccharide (PPSV23) vaccine. One dose is recommended after age 68. Talk to your health care provider about which screenings and vaccines you need and how often you need them. This information is not intended to replace advice given to you by your health care provider. Make sure you discuss any questions you have with your health care provider. Document Released: 04/07/2015 Document Revised: 11/29/2015 Document Reviewed: 01/10/2015 Elsevier Interactive Patient Education  2017 [employer].  Fall Prevention in the Home Falls can cause injuries. They can happen to people of all ages. There are many things you can do to make your home safe and to help prevent falls. What can I do on the outside of my home?  Regularly fix the edges of walkways and driveways and fix any cracks.  Remove anything that might make you trip as you walk through a door, such as a raised step or threshold.  Trim any bushes or trees on the path to your home.  Use bright outdoor lighting.  Clear any walking paths of anything that might make someone trip, such as rocks or tools.  Regularly check to see if handrails are loose or broken. Make sure that both sides of any steps have handrails.  Any raised decks and porches should have guardrails on the edges.  Have any leaves, snow, or ice cleared regularly.  Use sand or salt on walking paths during winter.  Clean up any spills in your garage right away. This includes oil or grease spills. What can I do in the bathroom?  Use night  lights.  Install grab bars by the toilet and in the tub and shower. Do not use towel bars as grab bars.  Use non-skid mats or decals in the tub or shower.  If you need to sit down in the shower, use a plastic, non-slip stool.  Keep the floor dry. Clean up any water that spills on the floor as soon as it happens.  Remove soap buildup in the tub or shower regularly.  Attach bath mats securely with double-sided non-slip rug tape.  Do not have throw rugs and other things on the floor that can make you trip. What can I do in the bedroom?  Use night lights.  Make sure that you have a light by your bed that is easy to reach.  Do not use any sheets or blankets that are too big for your bed. They should not hang down onto the floor.  Have a firm chair that has side arms. You can use this for support while you get dressed.  Do not have throw rugs and other  things on the floor that can make you trip. What can I do in the kitchen?  Clean up any spills right away.  Avoid walking on wet floors.  Keep items that you use a lot in easy-to-reach places.  If you need to reach something above you, use a strong step stool that has a grab bar.  Keep electrical cords out of the way.  Do not use floor polish or wax that makes floors slippery. If you must use wax, use non-skid floor wax.  Do not have throw rugs and other things on the floor that can make you trip. What can I do with my stairs?  Do not leave any items on the stairs.  Make sure that there are handrails on both sides of the stairs and use them. Fix handrails that are broken or loose. Make sure that handrails are as long as the stairways.  Check any carpeting to make sure that it is firmly attached to the stairs. Fix any carpet that is loose or worn.  Avoid having throw rugs at the top or bottom of the stairs. If you do have throw rugs, attach them to the floor with carpet tape.  Make sure that you have a light switch at the  top of the stairs and the bottom of the stairs. If you do not have them, ask someone to add them for you. What else can I do to help prevent falls?  Wear shoes that:  Do not have high heels.  Have rubber bottoms.  Are comfortable and fit you well.  Are closed at the toe. Do not wear sandals.  If you use a stepladder:  Make sure that it is fully opened. Do not climb a closed stepladder.  Make sure that both sides of the stepladder are locked into place.  Ask someone to hold it for you, if possible.  Clearly mark and make sure that you can see:  Any grab bars or handrails.  First and last steps.  Where the edge of each step is.  Use tools that help you move around (mobility aids) if they are needed. These include:  Canes.  Walkers.  Scooters.  Crutches.  Turn on the lights when you go into a dark area. Replace any light bulbs as soon as they burn out.  Set up your furniture so you have a clear path. Avoid moving your furniture around.  If any of your floors are uneven, fix them.  If there are any pets around you, be aware of where they are.  Review your medicines with your doctor. Some medicines can make you feel dizzy. This can increase your chance of falling. Ask your doctor what other things that you can do to help prevent falls. This information is not intended to replace advice given to you by your health care provider. Make sure you discuss any questions you have with your health care provider. Document Released: 01/05/2009 Document Revised: 08/17/2015 Document Reviewed: 04/15/2014 Elsevier Interactive Patient Education  2017 [employer].

## 2020-06-16 NOTE — Telephone Encounter (Signed)
Patient has been informed.

## 2020-06-16 NOTE — Telephone Encounter (Signed)
He had his physical several weeks ago.  It was too early for a PSA< which I  ordered last week

## 2020-08-14 ENCOUNTER — Other Ambulatory Visit (INDEPENDENT_AMBULATORY_CARE_PROVIDER_SITE_OTHER): Payer: Self-pay | Admitting: Nurse Practitioner

## 2020-08-14 DIAGNOSIS — I701 Atherosclerosis of renal artery: Secondary | ICD-10-CM

## 2020-08-15 ENCOUNTER — Other Ambulatory Visit: Payer: Self-pay

## 2020-08-15 ENCOUNTER — Ambulatory Visit (INDEPENDENT_AMBULATORY_CARE_PROVIDER_SITE_OTHER): Payer: Medicare Other

## 2020-08-15 ENCOUNTER — Ambulatory Visit (INDEPENDENT_AMBULATORY_CARE_PROVIDER_SITE_OTHER): Payer: Medicare Other | Admitting: Vascular Surgery

## 2020-08-15 ENCOUNTER — Encounter (INDEPENDENT_AMBULATORY_CARE_PROVIDER_SITE_OTHER): Payer: Self-pay | Admitting: Vascular Surgery

## 2020-08-15 VITALS — BP 120/61 | Ht 64.0 in | Wt 188.0 lb

## 2020-08-15 DIAGNOSIS — I10 Essential (primary) hypertension: Secondary | ICD-10-CM | POA: Diagnosis not present

## 2020-08-15 DIAGNOSIS — I701 Atherosclerosis of renal artery: Secondary | ICD-10-CM

## 2020-08-15 DIAGNOSIS — E782 Mixed hyperlipidemia: Secondary | ICD-10-CM | POA: Diagnosis not present

## 2020-08-15 NOTE — Assessment & Plan Note (Signed)
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  With a solitary kidney, I think checking this every year and 1/2 to 2 years with duplex will be prudent and we will continue to follow him with duplex over these intervals.

## 2020-08-15 NOTE — Progress Notes (Signed)
MRN : [id number]  Frederick Pace. is a 70 y.o. (1950/04/07) male who presents with chief complaint of  Chief Complaint  Patient presents with  . Follow-up    10 Mo Renal  .  History of Present Illness: Patient returns today in follow up.  His blood pressure and renal function have been normal to his knowledge.  He has a known solitary right kidney and there was concern for renal artery stenosis that prompted an angiogram a few years ago.  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  Current Outpatient Medications  Medication Sig Dispense Refill  . amLODipine (NORVASC) 10 MG tablet TAKE 1 TABLET BY MOUTH DAILY 90 tablet 1  . atorvastatin (LIPITOR) 20 MG tablet Take 1 tablet (20 mg total) by mouth daily. 90 tablet 3  . doxycycline (VIBRA-TABS) 100 MG tablet Take 1 tablet (100 mg total) by mouth 2 (two) times daily. 20 tablet 0  . losartan (COZAAR) 100 MG tablet TAKE ONE TABLET EVERY DAY 90 tablet 1  . metoprolol succinate (TOPROL-XL) 25 MG 24 hr tablet Take 1 tablet (25 mg total) by mouth daily. 90 tablet 1  . Red Yeast Rice Extract 600 MG CAPS Take 1 capsule by mouth 2 (two) times daily.     [name] spironolactone (ALDACTONE) 50 MG tablet Take 1 tablet (50 mg total) by mouth daily as needed (fluid retention). 30 tablet 2   No current facility-administered medications for this visit.    Past Medical History:  Diagnosis Date  . HTN (hypertension)   . Other and unspecified hyperlipidemia     Past Surgical History:  Procedure Laterality Date  . ANTERIOR CRUCIATE LIGAMENT REPAIR Left 2004  . RENAL ANGIOGRAPHY Right 01/08/2018   Procedure: RENAL ANGIOGRAPHY;  Surgeon: Annice Needy, MD;  Location: [facility];  Service: Cardiovascular;  Laterality: Right;     Social History   Tobacco Use  . Smoking status: Never Smoker  . Smokeless tobacco: Never Used  Vaping Use  . Vaping Use: Never  used  Substance Use Topics  . Alcohol use: Yes    Alcohol/week: 0.0 standard drinks    Comment: 1 drink every few months  . Drug use: No    Comment: Refused to answer      Family History  Problem Relation Age of Onset  . Dementia Mother   . Heart disease Father 91       CABG  . Mitral valve prolapse Father   . Aortic aneurysm Father   . Hyperlipidemia Other        family Hx  . Heart disease Paternal Uncle        Valve replacements    No Known Allergies   REVIEW OF SYSTEMS (Negative unless checked)  Constitutional: [] Weight loss  [] Fever  [] Chills Cardiac: [] Chest pain   [] Chest pressure   [] Palpitations   [] Shortness of breath when laying flat   [] Shortness of breath at rest   [] Shortness of breath with exertion. Vascular:  [] Pain in legs with walking   [] Pain in legs at rest   [] Pain in legs when laying flat   [] Claudication   [] Pain in feet when walking  [] Pain in feet at rest  [] Pain in feet when laying flat   [] History of DVT   [] Phlebitis   [] Swelling in legs   [] Varicose veins   [] Non-healing ulcers Pulmonary:   [] Uses home oxygen   []   Productive cough   [] Hemoptysis   [] Wheeze  [] COPD   [] Asthma Neurologic:  [] Dizziness  [] Blackouts   [] Seizures   [] History of stroke   [] History of TIA  [] Aphasia   [] Temporary blindness   [] Dysphagia   [] Weakness or numbness in arms   [] Weakness or numbness in legs Musculoskeletal:  [x] Arthritis   [] Joint swelling   [] Joint pain   [] Low back pain Hematologic:  [] Easy bruising  [] Easy bleeding   [] Hypercoagulable state   [] Anemic   Gastrointestinal:  [] Blood in stool   [] Vomiting blood  [] Gastroesophageal reflux/heartburn   [] Abdominal pain Genitourinary:  [] Chronic kidney disease   [] Difficult urination  [] Frequent urination  [] Burning with urination   [] Hematuria Skin:  [] Rashes   [] Ulcers   [] Wounds Psychological:  [] History of anxiety   []  History of major depression.  Physical Examination  BP 120/61   Ht 5\' 4"  (1.626 m)   Wt 188  lb (85.3 kg)   BMI 32.27 kg/m  Gen:  WD/WN, NAD Head: /AT, No temporalis wasting. Ear/Nose/Throat: Hearing grossly intact, nares w/o erythema or drainage Eyes: Conjunctiva clear. Sclera non-icteric Neck: Supple.  Trachea midline Pulmonary:  Good air movement, no use of accessory muscles.  Cardiac: RRR, no JVD Vascular:  Vessel Right Left  Radial Palpable Palpable                   Musculoskeletal: M/S 5/5 throughout.  No deformity or atrophy.  Arthritic changes to both hands.  This no lower extremity edema, but he does have significant hand edema. Neurologic: Sensation grossly intact in extremities.  Symmetrical.  Speech is fluent.  Psychiatric: Judgment intact, Mood & affect appropriate for pt's clinical situation. Dermatologic: No rashes or ulcers noted.  No cellulitis or open wounds.       Labs Recent Results (from the past 2160 hour(s))  Cologuard     Status: None   Collection Time: 05/21/20 12:00 AM  Result Value Ref Range   Cologuard Negative Negative  Uric acid     Status: None   Collection Time: 06/09/20 11:15 AM  Result Value Ref Range   Uric Acid, Serum 7.6 4.0 - 7.8 mg/dL  Microalbumin / creatinine urine ratio     Status: Abnormal   Collection Time: 06/09/20 11:15 AM  Result Value Ref Range   Microalb, Ur 16.1 (H) 0.0 - 1.9 mg/dL   Creatinine,U mg/dL   Microalb Creat Ratio 8.0 0.0 - 30.0 mg/g  VITAMIN D 25 Hydroxy (Vit-D Deficiency, Fractures)     Status: None   Collection Time: 06/09/20 11:15 AM  Result Value Ref Range   VITD 31.29 30.00 - 100.00 ng/mL  Hemoglobin A1c     Status: None   Collection Time: 06/09/20 11:15 AM  Result Value Ref Range   Hgb A1c MFr Bld 5.7 4.6 - 6.5 %    Comment: Glycemic Control Guidelines for People with Diabetes:Non Diabetic:  <6%Goal of Therapy: <7%Additional Action Suggested:  >8%   Comprehensive metabolic panel     Status: Abnormal   Collection Time: 06/09/20 11:15 AM  Result Value Ref Range   Sodium 138 135  - 145 mEq/L   Potassium 4.5 3.5 - 5.1 mEq/L   Chloride 100 96 - 112 mEq/L   CO2 27 19 - 32 mEq/L   Glucose, Bld 100 (H) 70 - 99 mg/dL   BUN 18 6 - 23 mg/dL   Creatinine, Ser 0.40 - 1.50 mg/dL   Total Bilirubin 0.9 0.2 - 1.2  mg/dL   Alkaline Phosphatase 81 39 - 117 U/L   AST 13 0 - 37 U/L   ALT 16 0 - 53 U/L   Total Protein 7.7 6.0 - 8.3 g/dL   Albumin 4.6 3.5 - 5.2 g/dL   GFR 28.41 >32.44 mL/min    Comment: Calculated using the CKD-EPI Creatinine Equation (2021)   Calcium 9.8 8.4 - 10.5 mg/dL  Lipid panel     Status: Abnormal   Collection Time: 06/09/20 11:15 AM  Result Value Ref Range   Cholesterol 207 (H) 0 - 200 mg/dL    Comment: ATP III Classification       Desirable:  < 200 mg/dL               Borderline High:  200 - 239 mg/dL          High:  > = 010 mg/dL   Triglycerides 272.5 0.0 - 149.0 mg/dL    Comment: Normal:  <366 mg/dLBorderline High:  150 - 199 mg/dL   HDL 44.03 >47.42 mg/dL   VLDL 59.5 0.0 - 63.8 mg/dL   LDL Cholesterol 756 (H) 0 - 99 mg/dL   Total CHOL/HDL Ratio 5     Comment:                Men          Women1/2 Average Risk     3.4          3.3Average Risk          5.0          4.42X Average Risk          9.6          7.13X Average Risk          15.0          11.0                       NonHDL 163.56     Comment: NOTE:  Non-HDL goal should be 30 mg/dL higher than patient's LDL goal (i.e. LDL goal of < 70 mg/dL, would have non-HDL goal of < 100 mg/dL)    Radiology No results found.  Assessment/Plan Mixed hyperlipidemia lipid control important in reducing the progression of atherosclerotic disease.  Essential hypertension Blood pressure control is improved.  This is important in reducing progression of atherosclerotic disease.  Renal artery stenosis (HCC) Duplex today is performed for follow-up with no hemodynamically significant right renal artery stenosis of the solitary kidney.  With a solitary kidney, I think checking this every year and 1/2 to 2  years with duplex will be prudent and we will continue to follow him with duplex over these intervals.    Festus Barren, MD  08/15/2020 9:43 AM    This note was created with Dragon medical transcription system.  Any errors from dictation are purely unintentional

## 2020-09-07 ENCOUNTER — Other Ambulatory Visit: Payer: Self-pay | Admitting: Internal Medicine

## 2020-10-18 DIAGNOSIS — M545 Low back pain, unspecified: Secondary | ICD-10-CM | POA: Diagnosis not present

## 2020-10-18 DIAGNOSIS — M5431 Sciatica, right side: Secondary | ICD-10-CM | POA: Diagnosis not present

## 2020-10-18 DIAGNOSIS — M9903 Segmental and somatic dysfunction of lumbar region: Secondary | ICD-10-CM | POA: Diagnosis not present

## 2020-10-18 DIAGNOSIS — M9905 Segmental and somatic dysfunction of pelvic region: Secondary | ICD-10-CM | POA: Diagnosis not present

## 2020-10-20 DIAGNOSIS — M9903 Segmental and somatic dysfunction of lumbar region: Secondary | ICD-10-CM | POA: Diagnosis not present

## 2020-10-20 DIAGNOSIS — M5431 Sciatica, right side: Secondary | ICD-10-CM | POA: Diagnosis not present

## 2020-10-20 DIAGNOSIS — M9905 Segmental and somatic dysfunction of pelvic region: Secondary | ICD-10-CM | POA: Diagnosis not present

## 2020-10-20 DIAGNOSIS — M545 Low back pain, unspecified: Secondary | ICD-10-CM | POA: Diagnosis not present

## 2020-10-23 DIAGNOSIS — M545 Low back pain, unspecified: Secondary | ICD-10-CM | POA: Diagnosis not present

## 2020-10-23 DIAGNOSIS — M9905 Segmental and somatic dysfunction of pelvic region: Secondary | ICD-10-CM | POA: Diagnosis not present

## 2020-10-23 DIAGNOSIS — M9903 Segmental and somatic dysfunction of lumbar region: Secondary | ICD-10-CM | POA: Diagnosis not present

## 2020-10-23 DIAGNOSIS — M5431 Sciatica, right side: Secondary | ICD-10-CM | POA: Diagnosis not present

## 2020-10-25 DIAGNOSIS — M5431 Sciatica, right side: Secondary | ICD-10-CM | POA: Diagnosis not present

## 2020-10-25 DIAGNOSIS — M9903 Segmental and somatic dysfunction of lumbar region: Secondary | ICD-10-CM | POA: Diagnosis not present

## 2020-10-25 DIAGNOSIS — M545 Low back pain, unspecified: Secondary | ICD-10-CM | POA: Diagnosis not present

## 2020-10-25 DIAGNOSIS — M9905 Segmental and somatic dysfunction of pelvic region: Secondary | ICD-10-CM | POA: Diagnosis not present

## 2020-10-27 DIAGNOSIS — M9905 Segmental and somatic dysfunction of pelvic region: Secondary | ICD-10-CM | POA: Diagnosis not present

## 2020-10-27 DIAGNOSIS — M545 Low back pain, unspecified: Secondary | ICD-10-CM | POA: Diagnosis not present

## 2020-10-27 DIAGNOSIS — M5431 Sciatica, right side: Secondary | ICD-10-CM | POA: Diagnosis not present

## 2020-10-27 DIAGNOSIS — M9903 Segmental and somatic dysfunction of lumbar region: Secondary | ICD-10-CM | POA: Diagnosis not present

## 2020-10-30 DIAGNOSIS — M5431 Sciatica, right side: Secondary | ICD-10-CM | POA: Diagnosis not present

## 2020-10-30 DIAGNOSIS — M545 Low back pain, unspecified: Secondary | ICD-10-CM | POA: Diagnosis not present

## 2020-10-30 DIAGNOSIS — M9903 Segmental and somatic dysfunction of lumbar region: Secondary | ICD-10-CM | POA: Diagnosis not present

## 2020-10-30 DIAGNOSIS — M9905 Segmental and somatic dysfunction of pelvic region: Secondary | ICD-10-CM | POA: Diagnosis not present

## 2020-11-06 DIAGNOSIS — M9905 Segmental and somatic dysfunction of pelvic region: Secondary | ICD-10-CM | POA: Diagnosis not present

## 2020-11-06 DIAGNOSIS — M9903 Segmental and somatic dysfunction of lumbar region: Secondary | ICD-10-CM | POA: Diagnosis not present

## 2020-11-06 DIAGNOSIS — M545 Low back pain, unspecified: Secondary | ICD-10-CM | POA: Diagnosis not present

## 2020-11-06 DIAGNOSIS — M5431 Sciatica, right side: Secondary | ICD-10-CM | POA: Diagnosis not present

## 2020-11-08 DIAGNOSIS — M9903 Segmental and somatic dysfunction of lumbar region: Secondary | ICD-10-CM | POA: Diagnosis not present

## 2020-11-08 DIAGNOSIS — M9905 Segmental and somatic dysfunction of pelvic region: Secondary | ICD-10-CM | POA: Diagnosis not present

## 2020-11-08 DIAGNOSIS — M5431 Sciatica, right side: Secondary | ICD-10-CM | POA: Diagnosis not present

## 2020-11-08 DIAGNOSIS — M545 Low back pain, unspecified: Secondary | ICD-10-CM | POA: Diagnosis not present

## 2020-11-13 DIAGNOSIS — M9905 Segmental and somatic dysfunction of pelvic region: Secondary | ICD-10-CM | POA: Diagnosis not present

## 2020-11-13 DIAGNOSIS — M5431 Sciatica, right side: Secondary | ICD-10-CM | POA: Diagnosis not present

## 2020-11-13 DIAGNOSIS — M9903 Segmental and somatic dysfunction of lumbar region: Secondary | ICD-10-CM | POA: Diagnosis not present

## 2020-11-13 DIAGNOSIS — M545 Low back pain, unspecified: Secondary | ICD-10-CM | POA: Diagnosis not present

## 2020-11-15 DIAGNOSIS — M9903 Segmental and somatic dysfunction of lumbar region: Secondary | ICD-10-CM | POA: Diagnosis not present

## 2020-11-15 DIAGNOSIS — M9905 Segmental and somatic dysfunction of pelvic region: Secondary | ICD-10-CM | POA: Diagnosis not present

## 2020-11-15 DIAGNOSIS — M545 Low back pain, unspecified: Secondary | ICD-10-CM | POA: Diagnosis not present

## 2020-11-15 DIAGNOSIS — M5431 Sciatica, right side: Secondary | ICD-10-CM | POA: Diagnosis not present

## 2020-11-17 DIAGNOSIS — M9903 Segmental and somatic dysfunction of lumbar region: Secondary | ICD-10-CM | POA: Diagnosis not present

## 2020-11-17 DIAGNOSIS — M9905 Segmental and somatic dysfunction of pelvic region: Secondary | ICD-10-CM | POA: Diagnosis not present

## 2020-11-17 DIAGNOSIS — M5431 Sciatica, right side: Secondary | ICD-10-CM | POA: Diagnosis not present

## 2020-11-17 DIAGNOSIS — M545 Low back pain, unspecified: Secondary | ICD-10-CM | POA: Diagnosis not present

## 2020-12-18 ENCOUNTER — Other Ambulatory Visit: Payer: Self-pay | Admitting: Internal Medicine

## 2021-01-19 ENCOUNTER — Other Ambulatory Visit: Payer: Self-pay | Admitting: Neurosurgery

## 2021-01-19 DIAGNOSIS — M5442 Lumbago with sciatica, left side: Secondary | ICD-10-CM | POA: Diagnosis not present

## 2021-01-19 DIAGNOSIS — G8929 Other chronic pain: Secondary | ICD-10-CM

## 2021-01-19 DIAGNOSIS — M542 Cervicalgia: Secondary | ICD-10-CM | POA: Diagnosis not present

## 2021-01-19 DIAGNOSIS — I10 Essential (primary) hypertension: Secondary | ICD-10-CM | POA: Diagnosis not present

## 2021-01-19 DIAGNOSIS — M5441 Lumbago with sciatica, right side: Secondary | ICD-10-CM | POA: Diagnosis not present

## 2021-01-29 ENCOUNTER — Ambulatory Visit
Admission: RE | Admit: 2021-01-29 | Discharge: 2021-01-29 | Disposition: A | Discharge status: Home / Self Care | Payer: Medicare Other | Source: Ambulatory Visit | Attending: Neurosurgery | Admitting: Neurosurgery

## 2021-01-29 ENCOUNTER — Other Ambulatory Visit: Payer: Self-pay

## 2021-01-29 DIAGNOSIS — M5441 Lumbago with sciatica, right side: Secondary | ICD-10-CM | POA: Insufficient documentation

## 2021-01-29 DIAGNOSIS — M545 Low back pain, unspecified: Secondary | ICD-10-CM | POA: Diagnosis not present

## 2021-01-29 DIAGNOSIS — G8929 Other chronic pain: Secondary | ICD-10-CM | POA: Insufficient documentation

## 2021-01-29 DIAGNOSIS — M5442 Lumbago with sciatica, left side: Secondary | ICD-10-CM | POA: Insufficient documentation

## 2021-01-29 DIAGNOSIS — M542 Cervicalgia: Secondary | ICD-10-CM | POA: Insufficient documentation

## 2021-01-29 IMAGING — MR MR CERVICAL SPINE W/O CM
5 series · 35 of 48 positions shown · non-contrast
Comparison: Cervical spine radiographs [DATE].

CLINICAL DATA: 70-year-old male with persistent neck pain radiating
down both arms with numbness. No known injury.

EXAM:
MRI CERVICAL SPINE WITHOUT CONTRAST
TECHNIQUE: Multiplanar, multisequence MR imaging of the cervical spine was
performed. No intravenous contrast was administered.

[Series 5: T2 · sagittal · 3.0mm · 0.62mm/px · 6 of 15 slices shown (1 of 2)]
[im 1/15]
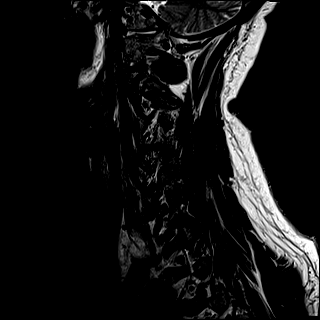
[im 3/15]
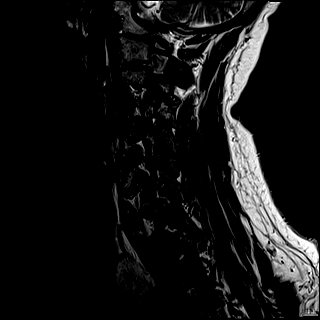
[im 6/15]
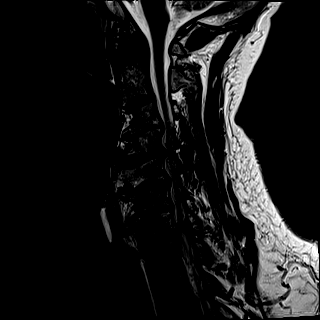
[im 9/15]
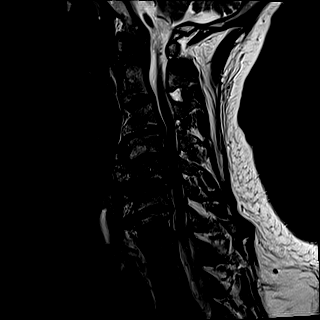
[im 12/15]
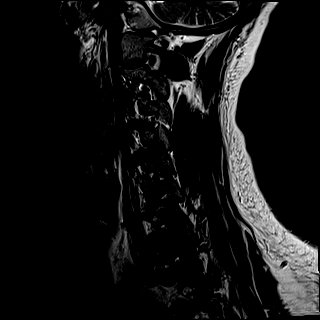
[im 15/15]
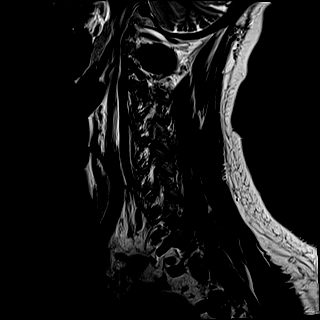

[Series 6: FLAIR · sagittal · 3.0mm · 0.78mm/px · 7 of 15 slices shown]
[im 1/15]
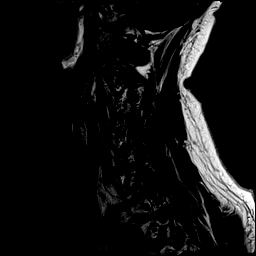
[im 3/15]
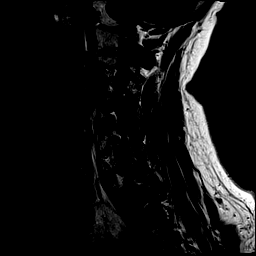
[im 5/15]
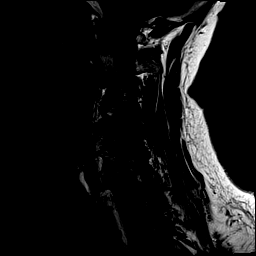
[im 8/15]
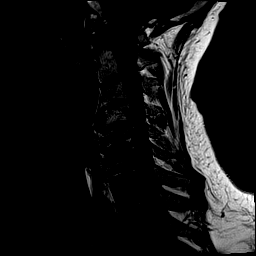
[im 10/15]
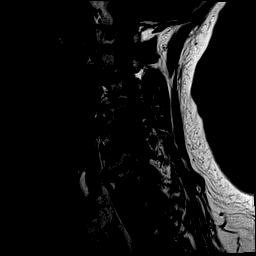
[im 12/15]
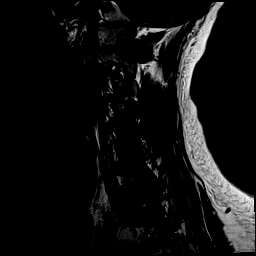
[im 15/15]
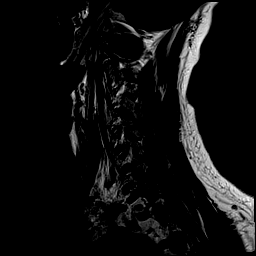

[Series 7: STIR · sagittal · 3.0mm · 0.62mm/px · 7 of 15 slices shown]
[im 1/15]
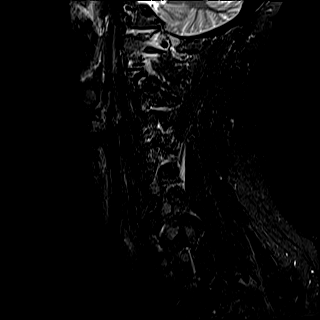
[im 3/15]
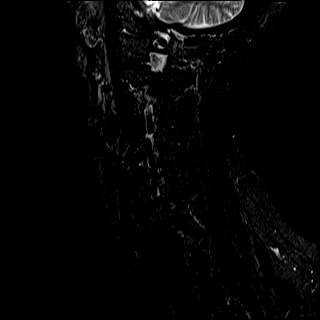
[im 5/15]
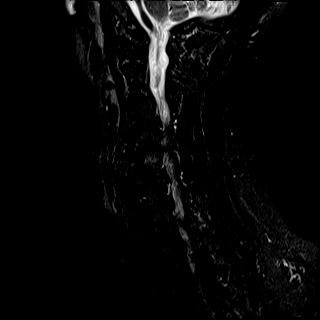
[im 8/15]
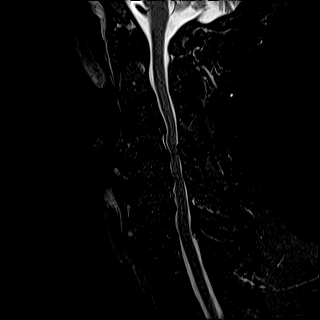
[im 10/15]
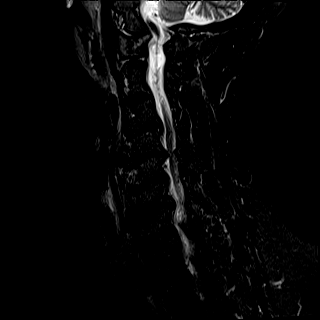
[im 12/15]
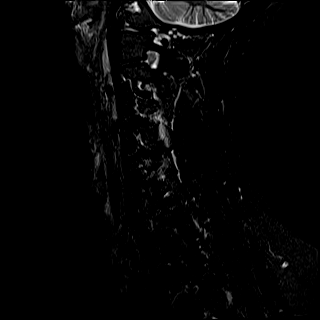
[im 15/15]
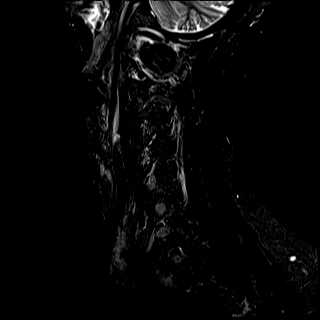

[Series 8: T2 · axial · 3.0mm · 0.70mm/px · z∈[-131,-26]mm · 8 of 32 slices shown (2 of 2)]
[im 1/32]
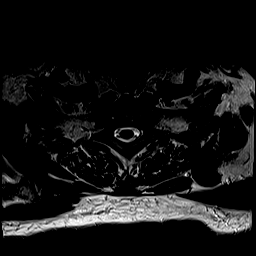
[im 5/32]
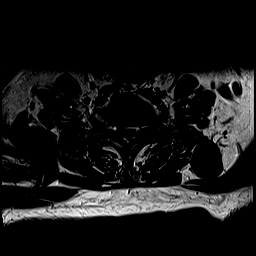
[im 10/32]
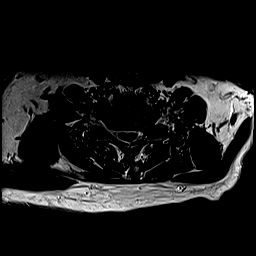
[im 15/32]
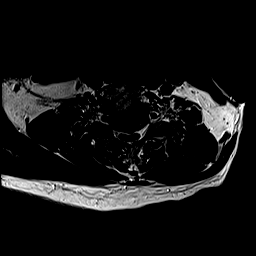
[im 17/32]
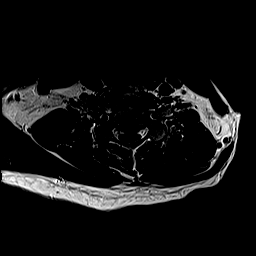
[im 22/32]
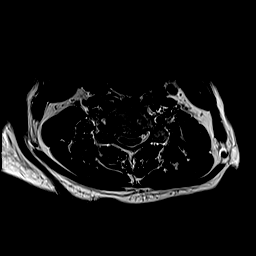
[im 27/32]
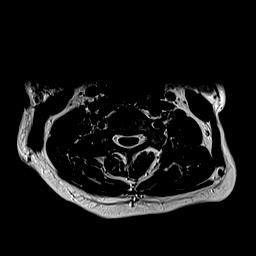
[im 32/32]
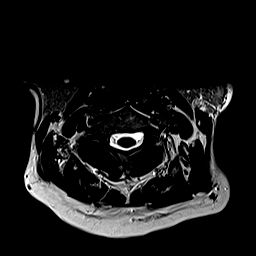

[Series 9: ax mpgr · axial · 3.0mm · 0.35mm/px · z∈[-131,-43]mm · 7 of 32 slices shown]
[im 1/32]
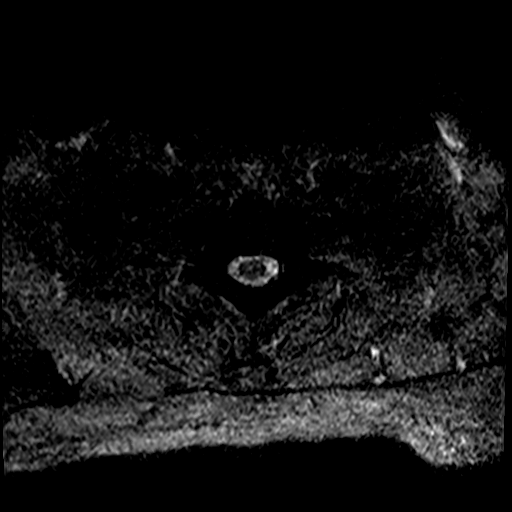
[im 5/32]
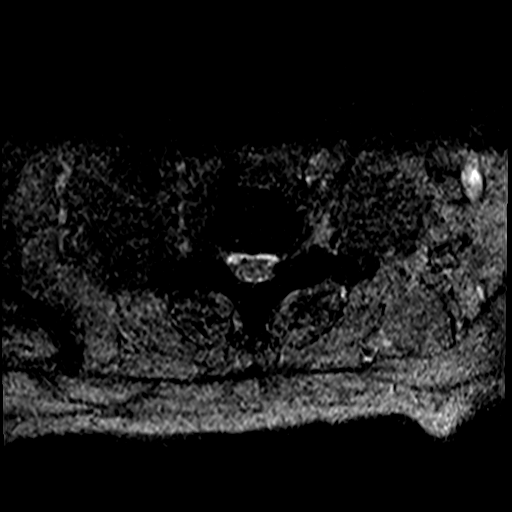
[im 10/32]
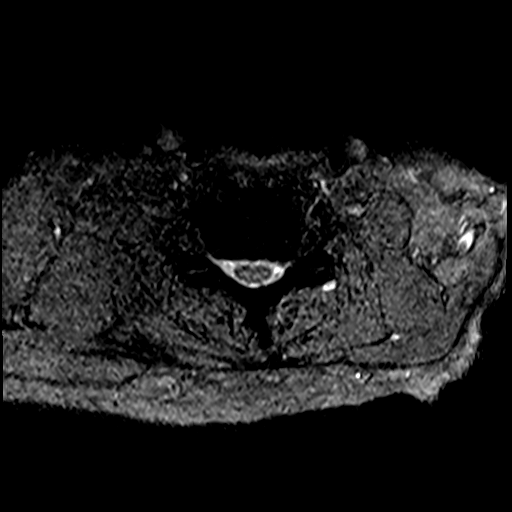
[im 15/32]
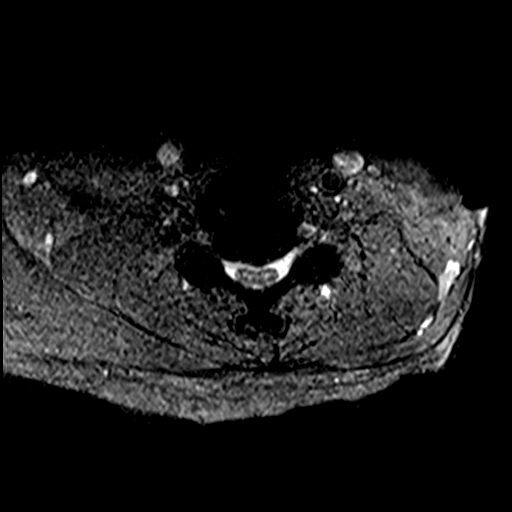
[im 17/32]
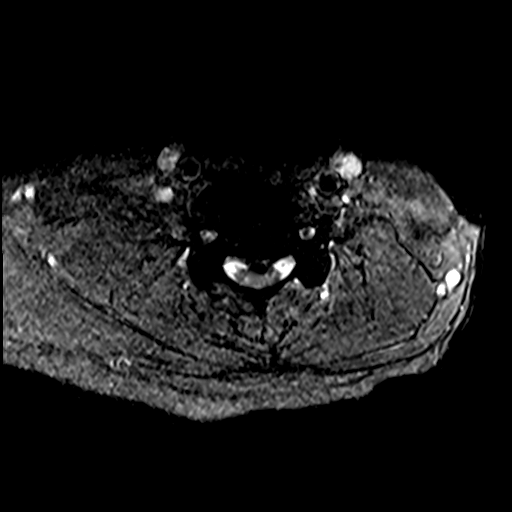
[im 22/32]
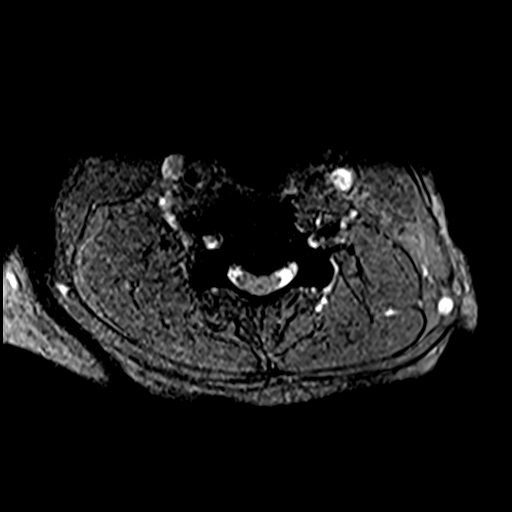
[im 27/32]
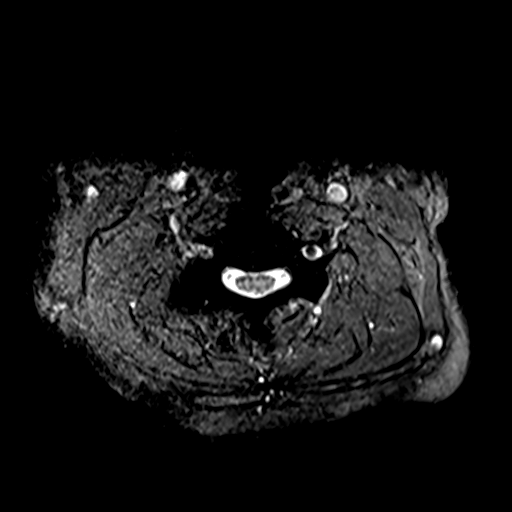

[35 of 48 positions shown; findings below may reference images not displayed]

FINDINGS: Alignment: Stable straightening of cervical lordosis. There is
subtle anterolisthesis of C2 on C3.

Vertebrae: Bulky anterior endplate osteophytosis in the cervical
spine C4 through C6, in keeping with Diffuse idiopathic skeletal
hyperostosis (DISH). No strong evidence of interbody ankylosis.
Degenerative endplate marrow signal changes. No marrow edema or
evidence of acute osseous abnormality.

Cord: Multilevel degenerative cervical spinal cord mass effect,
maximal at C4-C5 (see below). But no cord signal changes or
myelomalacia are evident. Fairly normal visible upper thoracic
spinal canal.

Posterior Fossa, vertebral arteries, paraspinal tissues:
Cervicomedullary junction is within normal limits. Negative visible
posterior fossa. Preserved major vascular flow voids in the neck.
Negative visible neck soft tissues, lung apices.

Disc levels:

C2-C3: Mild anterolisthesis. Moderate right side facet hypertrophy.
No spinal stenosis. But moderate to severe right C3 neural foraminal
stenosis related to facet disease.

C3-C4: Moderate to severe disc space loss with circumferential disc
osteophyte complex. Broad-based posterior component. Mild spinal
stenosis and spinal cord mass effect. Right greater than left
foraminal involvement. Mild to moderate left and moderate to severe
right C4 foraminal stenosis.

C4-C5: Bulky circumferential disc osteophyte complex. Broad-based
posterior component with slightly caudal component in the midline
(series 8, image 16). Moderate spinal stenosis and spinal cord mass
effect. Severe bilateral C5 foraminal stenosis, greater on the left.

C5-C6: Bulky circumferential disc osteophyte complex with
broad-based posterior component. Mild spinal stenosis and spinal
cord mass effect. Moderate to severe right C6 foraminal stenosis.

C6-C7: Bulky circumferential disc osteophyte complex. Broad-based
posterior component. Mild spinal stenosis. Mild if any cord mass
effect. Left greater than right foraminal involvement. Severe left
and moderate to severe right C7 foraminal stenosis.

C7-T1: Circumferential but mostly far lateral disc osteophyte
complex. Moderate facet and ligament flavum hypertrophy. Mild spinal
stenosis. Mild if any cord mass effect. Moderate to severe left and
mild to moderate right C8 foraminal stenosis.

No visible upper thoracic spinal stenosis although there is
bilateral upper thoracic foraminal stenosis related to facet and
uncovertebral spurring.
IMPRESSION: 1. Diffuse idiopathic skeletal hyperostosis (DISH) with bulky
anterior cervical endplate spurring C4 through C6. No definite
interbody ankylosis, no acute osseous abnormality.

2. Widespread cervical spinal stenosis, up to moderate spinal
stenosis at C4-C5. Multilevel spinal cord mass effect, but no cord
signal changes or myelomalacia identified.

3. Moderate or severe neural foraminal stenosis at the right C3,
right C4, bilateral C5, right C6, bilateral C7 and left C8 nerve
levels.

## 2021-01-29 IMAGING — MR MR LUMBAR SPINE W/O CM
4 of 5 series · 28 of 48 positions shown · non-contrast
Comparison: Lumbar radiographs [DATE].
COMPARISON: Lumbar radiographs [DATE].

Addendum:
CLINICAL DATA: 70-year-old male with persistent low back pain
radiating down both legs. No known injury.

EXAM:
MRI LUMBAR SPINE WITHOUT CONTRAST
TECHNIQUE: Multiplanar, multisequence MR imaging of the lumbar spine was
performed. No intravenous contrast was administered.

[Series 14: T2 · sagittal · 4.0mm · 0.81mm/px · 6 of 17 slices shown (1 of 2)]
[im 1/17]
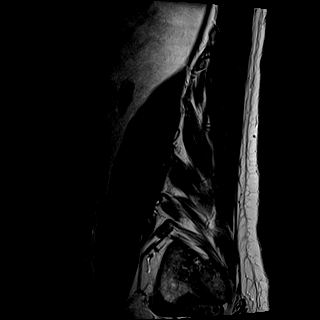
[im 4/17]
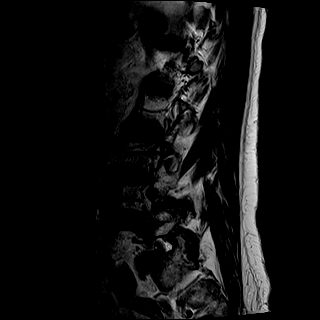
[im 7/17]
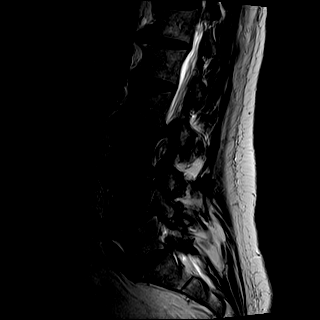
[im 10/17]
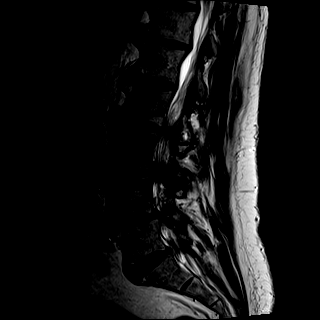
[im 13/17]
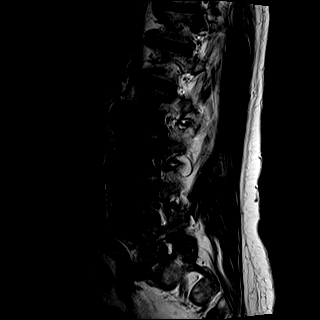
[im 17/17]
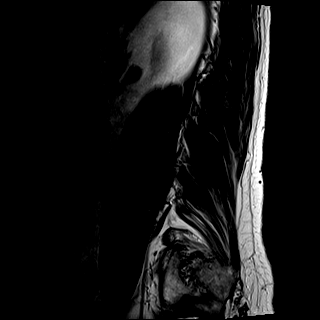

[Series 15: T1 · sagittal · 4.0mm · 0.81mm/px · 7 of 17 slices shown (1 of 2)]
[im 1/17]
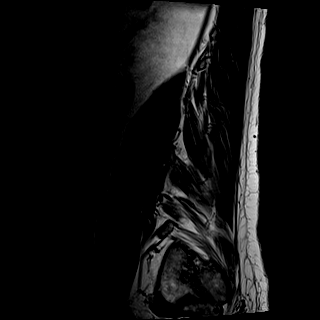
[im 3/17]
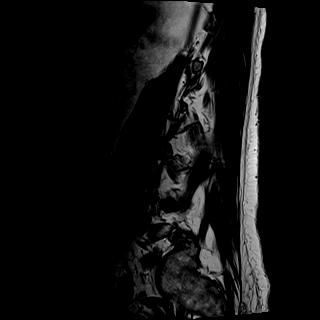
[im 6/17]
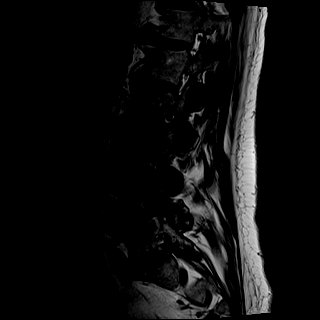
[im 9/17]
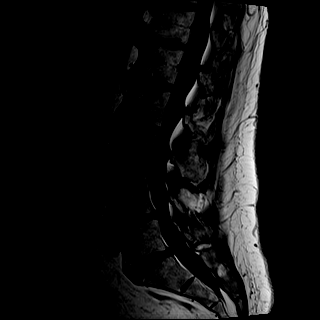
[im 11/17]
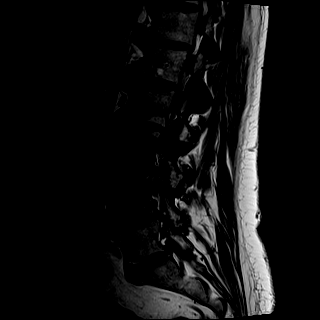
[im 14/17]
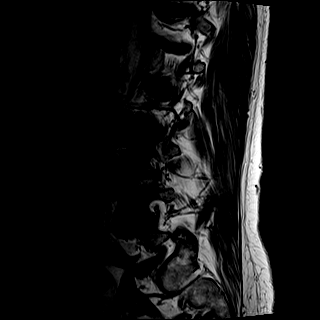
[im 17/17]
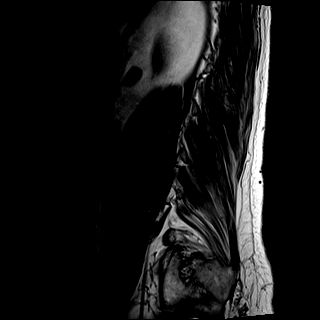

[Series 17: T2 · axial · 4.0mm · 0.78mm/px · z∈[-597,-396]mm · 8 of 34 slices shown (2 of 2)]
[im 1/34]
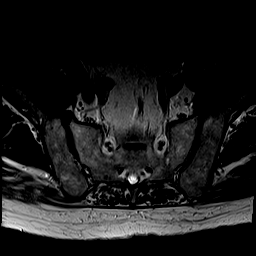
[im 6/34]
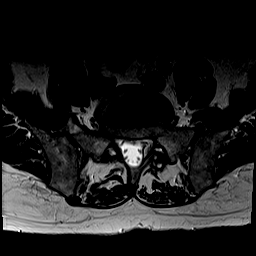
[im 11/34]
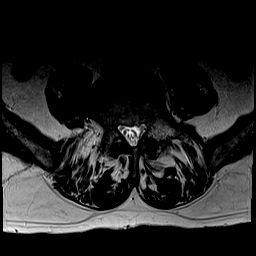
[im 16/34]
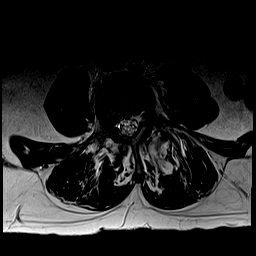
[im 18/34]
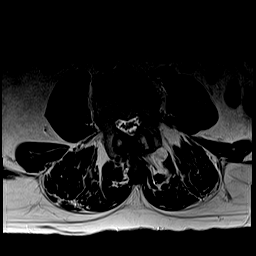
[im 23/34]
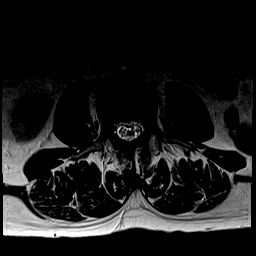
[im 28/34]
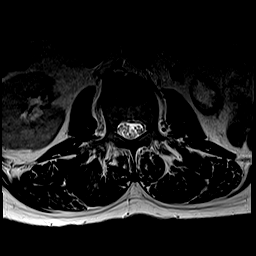
[im 34/34]
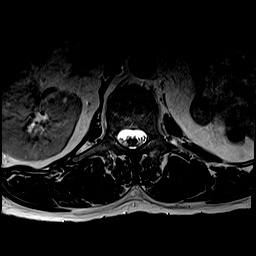

[Series 19: T1 · axial · 4.0mm · 0.39mm/px · z∈[-597,-432]mm · 7 of 34 slices shown (2 of 2)]
[im 1/34]
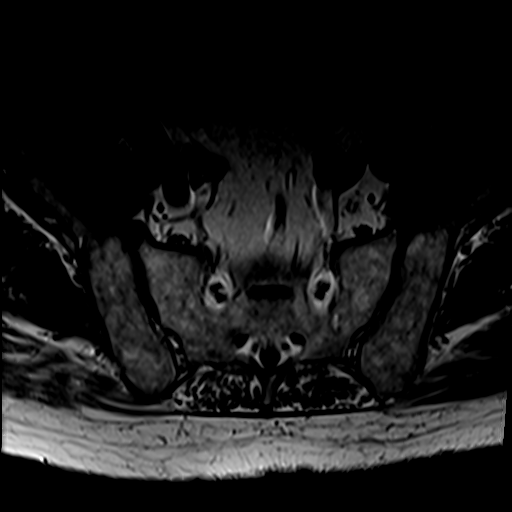
[im 6/34]
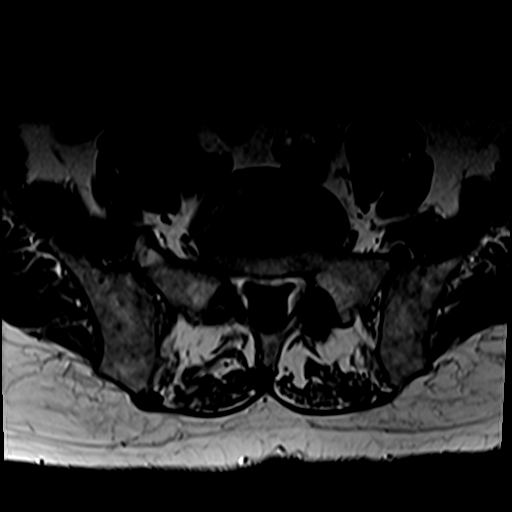
[im 11/34]
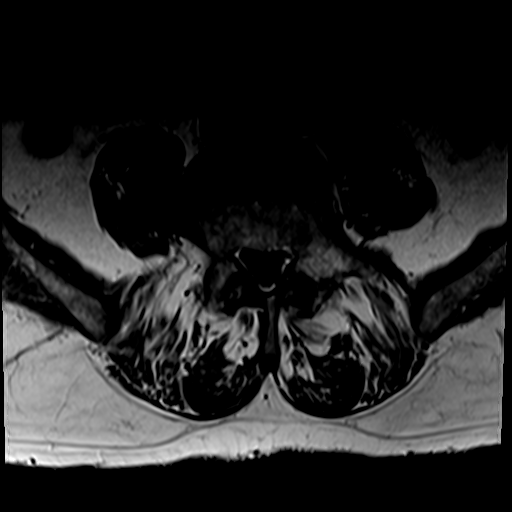
[im 16/34]
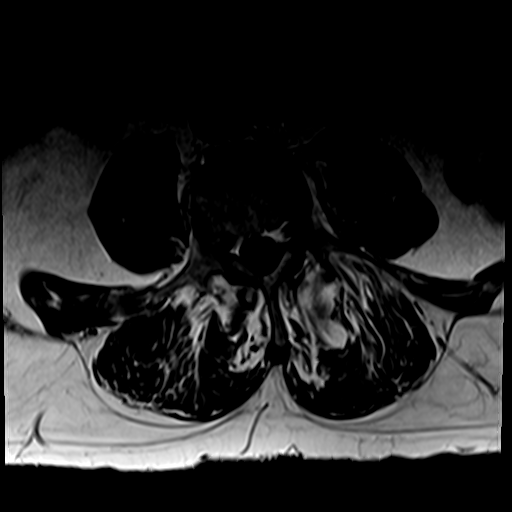
[im 18/34]
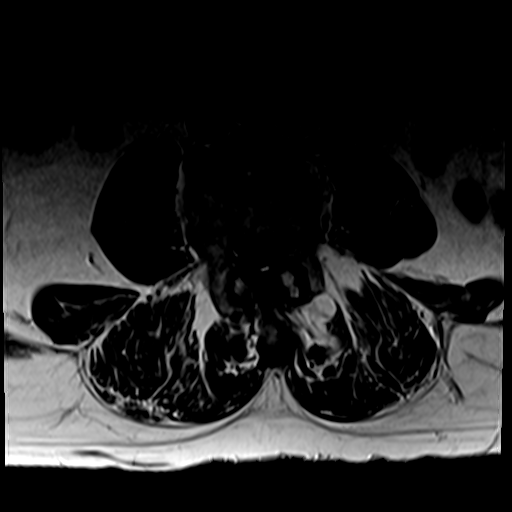
[im 23/34]
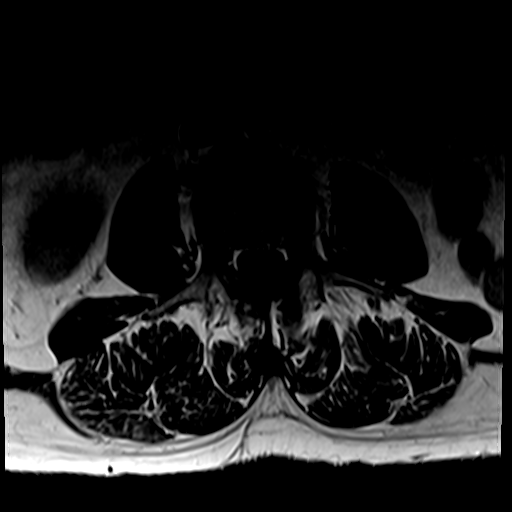
[im 28/34]
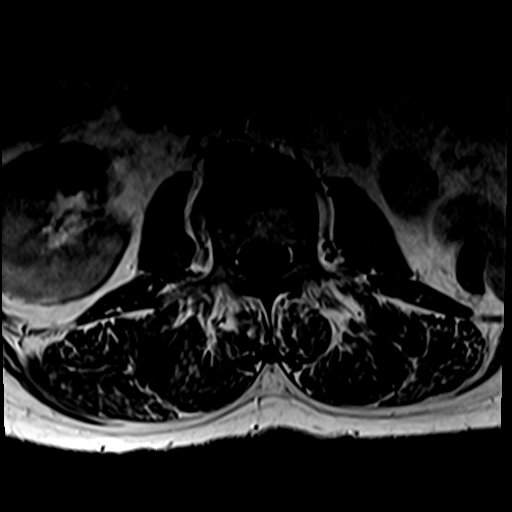

[28 of 48 positions shown; findings below may reference images not displayed]

FINDINGS: Segmentation:  Normal on the comparison.

Alignment: Dextroconvex lumbar scoliosis, mild-to-moderate. Grade 1
anterolisthesis of L4 on L5 measures 4 mm. Subtle anterolisthesis of
L3 on L4. Mild retrolisthesis of L2 on L3.

Vertebrae: No marrow edema or evidence of acute osseous abnormality.
Visualized bone marrow signal is within normal limits. Intact
visible sacrum and SI joints.

Conus medullaris and cauda equina: Conus extends to the L1 level. No
lower spinal cord or conus signal abnormality.

Paraspinal and other soft tissues: Solitary right kidney. Possible
small residual parenchyma of severe left renal atrophy on series 17,
image 16 in the left renal fossa. Otherwise negative visible
abdominal viscera. Negative paraspinal soft tissues.

Disc levels:

T11-T12: Partially visible disc bulging and facet hypertrophy. No
obvious stenosis.

T12-L1: Mostly anterior circumferential disc bulge. Mild facet
hypertrophy. No significant stenosis.

L1-L2:  Mild facet hypertrophy. No stenosis.

L2-L3: Bulky left eccentric circumferential disc bulge with endplate
spurring. Moderate facet and ligament flavum hypertrophy. Moderate
spinal stenosis with severe left lateral recess stenosis (left L3
nerve level series 17, image 10). Mild to moderate left and mild
right L2 foraminal stenosis.

L3-L4: Disc space loss with bulky circumferential disc bulge and
superimposed central and slightly cephalad disc extrusion in the
midline (series 14, image 9 and series 17, image 14. Moderate to
severe facet and ligament flavum hypertrophy. Severe spinal
stenosis. Left greater than right lateral recess stenosis (left L4
nerve levels). Mild to moderate L3 foraminal stenosis greater on
right.

L4-L5: Anterolisthesis with mild disc space loss. Circumferential
disc/pseudo disc eccentric to the left with endplate spurring.
Severe left facet hypertrophy. Moderate to severe right facet and
moderate ligament flavum hypertrophy. Moderate to severe spinal
stenosis with severe left greater than right lateral recess stenosis
(L5 nerve levels). Superimposed small cephalad right foraminal disc
extrusion best seen on series 17, image 20. Up to moderate bilateral
L4 neural foraminal stenosis.

L5-S1: Circumferential disc bulge with endplate spurring. Moderate
facet hypertrophy greater on the left. No spinal or lateral recess
stenosis. Moderate bilateral L5 foraminal stenosis.
IMPRESSION: 1. Dextroconvex scoliosis with grade 1 spondylolisthesis at L4-L5.
Multilevel bulky disc, endplate, and posterior element degeneration.
2. Subsequent moderate or severe multifactorial spinal stenosis
L2-L3 through L4-L5. Especially severe lateral recess stenosis at
the left L3 and L5 nerve levels. Up to moderate bilateral foraminal
stenosis at each level.
3. Moderate bilateral foraminal stenosis also at L5-S1.

ADDENDUM:
There should also be an impression #4 which reads:

4. Solitary Right Kidney with either a vestigial or severely
atrophied left kidney.

*** End of Addendum ***
FINDINGS: Segmentation:  Normal on the comparison.

Alignment: Dextroconvex lumbar scoliosis, mild-to-moderate. Grade 1
anterolisthesis of L4 on L5 measures 4 mm. Subtle anterolisthesis of
L3 on L4. Mild retrolisthesis of L2 on L3.

Vertebrae: No marrow edema or evidence of acute osseous abnormality.
Visualized bone marrow signal is within normal limits. Intact
visible sacrum and SI joints.

Conus medullaris and cauda equina: Conus extends to the L1 level. No
lower spinal cord or conus signal abnormality.

Paraspinal and other soft tissues: Solitary right kidney. Possible
small residual parenchyma of severe left renal atrophy on series 17,
image 16 in the left renal fossa. Otherwise negative visible
abdominal viscera. Negative paraspinal soft tissues.

Disc levels:

T11-T12: Partially visible disc bulging and facet hypertrophy. No
obvious stenosis.

T12-L1: Mostly anterior circumferential disc bulge. Mild facet
hypertrophy. No significant stenosis.

L1-L2:  Mild facet hypertrophy. No stenosis.

L2-L3: Bulky left eccentric circumferential disc bulge with endplate
spurring. Moderate facet and ligament flavum hypertrophy. Moderate
spinal stenosis with severe left lateral recess stenosis (left L3
nerve level series 17, image 10). Mild to moderate left and mild
right L2 foraminal stenosis.

L3-L4: Disc space loss with bulky circumferential disc bulge and
superimposed central and slightly cephalad disc extrusion in the
midline (series 14, image 9 and series 17, image 14. Moderate to
severe facet and ligament flavum hypertrophy. Severe spinal
stenosis. Left greater than right lateral recess stenosis (left L4
nerve levels). Mild to moderate L3 foraminal stenosis greater on
right.

L4-L5: Anterolisthesis with mild disc space loss. Circumferential
disc/pseudo disc eccentric to the left with endplate spurring.
Severe left facet hypertrophy. Moderate to severe right facet and
moderate ligament flavum hypertrophy. Moderate to severe spinal
stenosis with severe left greater than right lateral recess stenosis
(L5 nerve levels). Superimposed small cephalad right foraminal disc
extrusion best seen on series 17, image 20. Up to moderate bilateral
L4 neural foraminal stenosis.

L5-S1: Circumferential disc bulge with endplate spurring. Moderate
facet hypertrophy greater on the left. No spinal or lateral recess
stenosis. Moderate bilateral L5 foraminal stenosis.
IMPRESSION: 1. Dextroconvex scoliosis with grade 1 spondylolisthesis at L4-L5.
Multilevel bulky disc, endplate, and posterior element degeneration.
2. Subsequent moderate or severe multifactorial spinal stenosis
L2-L3 through L4-L5. Especially severe lateral recess stenosis at
the left L3 and L5 nerve levels. Up to moderate bilateral foraminal
stenosis at each level.
3. Moderate bilateral foraminal stenosis also at L5-S1.

## 2021-02-02 DIAGNOSIS — M4150 Other secondary scoliosis, site unspecified: Secondary | ICD-10-CM | POA: Diagnosis not present

## 2021-02-02 DIAGNOSIS — M48062 Spinal stenosis, lumbar region with neurogenic claudication: Secondary | ICD-10-CM | POA: Diagnosis not present

## 2021-02-02 DIAGNOSIS — M542 Cervicalgia: Secondary | ICD-10-CM | POA: Diagnosis not present

## 2021-02-02 DIAGNOSIS — M4712 Other spondylosis with myelopathy, cervical region: Secondary | ICD-10-CM | POA: Diagnosis not present

## 2021-03-29 ENCOUNTER — Other Ambulatory Visit: Payer: Self-pay | Admitting: Internal Medicine

## 2021-06-11 ENCOUNTER — Other Ambulatory Visit: Payer: Self-pay | Admitting: Internal Medicine

## 2021-06-11 ENCOUNTER — Telehealth: Payer: Self-pay | Admitting: Internal Medicine

## 2021-06-11 DIAGNOSIS — E782 Mixed hyperlipidemia: Secondary | ICD-10-CM

## 2021-06-11 DIAGNOSIS — Z125 Encounter for screening for malignant neoplasm of prostate: Secondary | ICD-10-CM

## 2021-06-11 DIAGNOSIS — I10 Essential (primary) hypertension: Secondary | ICD-10-CM

## 2021-06-11 DIAGNOSIS — R7301 Impaired fasting glucose: Secondary | ICD-10-CM

## 2021-06-11 DIAGNOSIS — R5383 Other fatigue: Secondary | ICD-10-CM

## 2021-06-11 NOTE — Telephone Encounter (Signed)
Labs have been ordered for the lab appt.  ?

## 2021-06-11 NOTE — Telephone Encounter (Signed)
Patient requesting fasting labs before physical . Labs scheduled for 07/12/21. ?

## 2021-06-14 ENCOUNTER — Ambulatory Visit: Payer: Medicare Other

## 2021-06-15 ENCOUNTER — Encounter: Payer: Medicare Other | Admitting: Internal Medicine

## 2021-06-25 ENCOUNTER — Other Ambulatory Visit: Payer: Self-pay | Admitting: Internal Medicine

## 2021-07-03 ENCOUNTER — Other Ambulatory Visit: Payer: Self-pay | Admitting: Internal Medicine

## 2021-07-12 ENCOUNTER — Other Ambulatory Visit (INDEPENDENT_AMBULATORY_CARE_PROVIDER_SITE_OTHER): Payer: Medicare Other

## 2021-07-12 DIAGNOSIS — E782 Mixed hyperlipidemia: Secondary | ICD-10-CM | POA: Diagnosis not present

## 2021-07-12 DIAGNOSIS — R5383 Other fatigue: Secondary | ICD-10-CM

## 2021-07-12 DIAGNOSIS — R7301 Impaired fasting glucose: Secondary | ICD-10-CM

## 2021-07-12 DIAGNOSIS — I10 Essential (primary) hypertension: Secondary | ICD-10-CM

## 2021-07-12 LAB — COMPREHENSIVE METABOLIC PANEL
ALT: 22 U/L (ref 0–53)
AST: 14 U/L (ref 0–37)
Albumin: 4.5 g/dL (ref 3.5–5.2)
Alkaline Phosphatase: 78 U/L (ref 39–117)
BUN: 21 mg/dL (ref 6–23)
CO2: 27 mEq/L (ref 19–32)
Calcium: 9.3 mg/dL (ref 8.4–10.5)
Chloride: 101 mEq/L (ref 96–112)
Creatinine, Ser: 1.03 mg/dL (ref 0.40–1.50)
GFR: 74.28 mL/min (ref >60.00)
Glucose, Bld: 101 mg/dL — ABNORMAL HIGH (ref 70–99)
Potassium: 4.5 mEq/L (ref 3.5–5.1)
Sodium: 136 mEq/L (ref 135–145)
Total Bilirubin: 0.8 mg/dL (ref 0.2–1.2)
Total Protein: 7.4 g/dL (ref 6.0–8.3)

## 2021-07-12 LAB — LIPID PANEL
Cholesterol: 207 mg/dL — ABNORMAL HIGH (ref 0–200)
HDL: 41.9 mg/dL (ref >39.00)
LDL Cholesterol: 140 mg/dL — ABNORMAL HIGH (ref 0–99)
NonHDL: 165.43
Total CHOL/HDL Ratio: 5
Triglycerides: 129 mg/dL (ref 0.0–149.0)
VLDL: 25.8 mg/dL (ref 0.0–40.0)

## 2021-07-12 LAB — CBC WITH DIFFERENTIAL/PLATELET
Basophils Absolute: 0.1 10*3/uL (ref 0.0–0.1)
Basophils Relative: 0.7 % (ref 0.0–3.0)
Eosinophils Absolute: 0.1 10*3/uL (ref 0.0–0.7)
Eosinophils Relative: 1.3 % (ref 0.0–5.0)
HCT: 46.1 % (ref 39.0–52.0)
Hemoglobin: 15.5 g/dL (ref 13.0–17.0)
Lymphocytes Relative: 27.3 % (ref 12.0–46.0)
Lymphs Abs: 1.9 10*3/uL (ref 0.7–4.0)
MCHC: 33.6 g/dL (ref 30.0–36.0)
MCV: 91.5 fl (ref 78.0–100.0)
Monocytes Absolute: 0.7 10*3/uL (ref 0.1–1.0)
Monocytes Relative: 10.1 % (ref 3.0–12.0)
Neutro Abs: 4.3 10*3/uL (ref 1.4–7.7)
Neutrophils Relative %: 60.6 % (ref 43.0–77.0)
Platelets: 228 10*3/uL (ref 150.0–400.0)
RBC: 5.04 Mil/uL (ref 4.22–5.81)
RDW: 13.3 % (ref 11.5–15.5)
WBC: 7.1 10*3/uL (ref 4.0–10.5)

## 2021-07-12 LAB — MICROALBUMIN / CREATININE URINE RATIO
Creatinine,U: 138.1 mg/dL
Microalb Creat Ratio: 2.8 mg/g (ref 0.0–30.0)
Microalb, Ur: 3.8 mg/dL — ABNORMAL HIGH (ref 0.0–1.9)

## 2021-07-12 LAB — HEMOGLOBIN A1C: Hgb A1c MFr Bld: 5.9 % (ref 4.6–6.5)

## 2021-07-12 LAB — TSH: TSH: 3.25 u[IU]/mL (ref 0.35–5.50)

## 2021-07-16 ENCOUNTER — Ambulatory Visit (INDEPENDENT_AMBULATORY_CARE_PROVIDER_SITE_OTHER): Payer: Medicare Other

## 2021-07-16 ENCOUNTER — Encounter: Payer: Medicare Other | Admitting: Internal Medicine

## 2021-07-16 ENCOUNTER — Encounter: Payer: Self-pay | Admitting: Pharmacist

## 2021-07-16 VITALS — Ht 64.0 in | Wt 188.0 lb

## 2021-07-16 DIAGNOSIS — Z9189 Other specified personal risk factors, not elsewhere classified: Secondary | ICD-10-CM

## 2021-07-16 DIAGNOSIS — Z0000 Encounter for general adult medical examination without abnormal findings: Secondary | ICD-10-CM

## 2021-07-16 NOTE — Patient Instructions (Addendum)
?  Frederick Pace , ?Thank you for taking time to come for your Medicare Wellness Visit. I appreciate your ongoing commitment to your health goals. Please review the following plan we discussed and let me know if I can assist you in the future.  ? ?These are the goals we discussed: ? Goals   ? ?  DIET - INCREASE LEAN PROTEINS   ?  Low carb foods. ?Healthy snack. ?Portion control. ?  ? ?  ?  ?This is a list of the screening recommended for you and due dates:  ?Health Maintenance  ?Topic Date Due  ? COVID-19 Vaccine (4 - Booster for Moderna series) 08/01/2021*  ? Zoster (Shingles) Vaccine (1 of 2) 10/15/2021*  ? Pneumonia Vaccine (1 - PCV) 07/17/2022*  ? Flu Shot  10/23/2021  ? Cologuard (Stool DNA test)  05/22/2023  ? Tetanus Vaccine  12/13/2024  ? Hepatitis C Screening: USPSTF Recommendation to screen - Ages 42-79 yo.  Completed  ? HPV Vaccine  Aged Out  ? Colon Cancer Screening  Discontinued  ?*Topic was postponed. The date shown is not the original due date.  ?  ?

## 2021-07-16 NOTE — Progress Notes (Signed)
Subjective:   Frederick Pace. is a 71 y.o. male who presents for Medicare Annual/Subsequent preventive examination.  Review of Systems    No ROS.  Medicare Wellness Virtual Visit.  Visual/audio telehealth visit, UTA vital signs.   See social history for additional risk factors.   Cardiac Risk Factors include: advanced age (>33men, >60 women);male gender     Objective:    Today's Vitals   07/16/21 1241  Weight: 188 lb (85.3 kg)  Height: 5\' 4"  (1.626 m)   Body mass index is 32.27 kg/m.     07/16/2021   12:48 PM 06/13/2020   11:27 AM 06/11/2019   11:16 AM 06/05/2018   12:25 PM  Advanced Directives  Does Patient Have a Medical Advance Directive? No No No No  Would patient like information on creating a medical advance directive? No - Patient declined No - Patient declined No - Patient declined No - Patient declined    Current Medications (verified) Outpatient Encounter Medications as of 07/16/2021  Medication Sig   amLODipine (NORVASC) 10 MG tablet TAKE 1 TABLET BY MOUTH DAILY   atorvastatin (LIPITOR) 20 MG tablet Take 1 tablet (20 mg total) by mouth daily.   doxycycline (VIBRA-TABS) 100 MG tablet Take 1 tablet (100 mg total) by mouth 2 (two) times daily.   losartan (COZAAR) 100 MG tablet TAKE 1 TABLET BY MOUTH DAILY   metoprolol succinate (TOPROL-XL) 25 MG 24 hr tablet TAKE 1 TABLET BY MOUTH DAILY.   Red Yeast Rice Extract 600 MG CAPS Take 1 capsule by mouth 2 (two) times daily.    spironolactone (ALDACTONE) 50 MG tablet Take 1 tablet (50 mg total) by mouth daily as needed (fluid retention).   No facility-administered encounter medications on file as of 07/16/2021.    Allergies (verified) Patient has no known allergies.   History: Past Medical History:  Diagnosis Date   HTN (hypertension)    Other and unspecified hyperlipidemia    Past Surgical History:  Procedure Laterality Date   ANTERIOR CRUCIATE LIGAMENT REPAIR Left 2004   RENAL ANGIOGRAPHY Right  01/08/2018   Procedure: RENAL ANGIOGRAPHY;  Surgeon: Annice Needy, MD;  Location: [facility] INVASIVE CV LAB;  Service: Cardiovascular;  Laterality: Right;   Family History  Problem Relation Age of Onset   Dementia Mother    Heart disease Father 31       CABG   Mitral valve prolapse Father    Aortic aneurysm Father    Heart disease Paternal Uncle        Valve replacements   Hyperlipidemia Other        family Hx   Social History   Socioeconomic History   Marital status: Single    Spouse name: Not on file   Number of children: Not on file   Years of education: Not on file   Highest education level: Not on file  Occupational History   Not on file  Tobacco Use   Smoking status: Never   Smokeless tobacco: Never  Vaping Use   Vaping Use: Never used  Substance and Sexual Activity   Alcohol use: Yes    Alcohol/week: 0.0 standard drinks    Comment: 1 drink every few months   Drug use: No    Comment: Refused to answer   Sexual activity: Not on file  Other Topics Concern   Not on file  Social History Narrative   Not on file   Social Determinants of Health   Financial Resource Strain:  Low Risk    Difficulty of Paying Living Expenses: Not hard at all  Food Insecurity: No Food Insecurity   Worried About Running Out of Food in the Last Year: Never true   Ran Out of Food in the Last Year: Never true  Transportation Needs: No Transportation Needs   Lack of Transportation (Medical): No   Lack of Transportation (Non-Medical): No  Physical Activity: Not on file  Stress: No Stress Concern Present   Feeling of Stress : Not at all  Social Connections: Unknown   Frequency of Communication with Friends and Family: More than three times a week   Frequency of Social Gatherings with Friends and Family: Not on file   Attends Religious Services: Not on file   Active Member of Clubs or Organizations: Not on file   Attends Banker Meetings: Not on file   Marital Status: Not on  file   Tobacco Counseling Counseling given: Not Answered  Clinical Intake: Pre-visit preparation completed: Yes        Diabetes: No  How often do you need to have someone help you when you read instructions, pamphlets, or other written materials from your doctor or pharmacy?: 1 - Never   Interpreter Needed?: No    Activities of Daily Living    07/16/2021   12:40 PM  In your present state of health, do you have any difficulty performing the following activities:  Hearing? 0  Vision? 0  Difficulty concentrating or making decisions? 0  Walking or climbing stairs? 0  Dressing or bathing? 0  Doing errands, shopping? 0  Preparing Food and eating ? N  Using the Toilet? N  In the past six months, have you accidently leaked urine? N  Do you have problems with loss of bowel control? N  Managing your Medications? N  Managing your Finances? N  Housekeeping or managing your Housekeeping? N   Patient Care Team: Sherlene Shams, MD as PCP - General (Internal Medicine)  Indicate any recent Medical Services you may have received from other than [facility] providers in the past year (date may be approximate).     Assessment:   This is a routine wellness examination for [facility].  Virtual Visit via Telephone Note  I connected with  Frederick Pace. on 07/16/21 at 12:30 PM EDT by telephone and verified that I am speaking with the correct person using two identifiers.  Persons participating in the virtual visit: patient/Nurse Health Advisor   I discussed the limitations of performing an evaluation and management service by telehealth. The patient expressed understanding and agreed to proceed. We continued and completed visit with audio only. Some vital signs may be absent or patient reported.   Hearing/Vision screen Hearing Screening - Comments:: Patient is able to hear conversational tones without difficulty. No issues reported. Vision Screening - Comments:: Visual acuity not  assessed, virtual visit. They have seen their ophthalmologist.   Dietary issues and exercise activities discussed: Current Exercise Habits: Home exercise routine, Intensity: Mild   Goals Addressed             This Visit's Progress    DIET - INCREASE LEAN PROTEINS       Low carb foods. Healthy snack. Portion control.       Depression Screen    07/16/2021   12:40 PM 06/13/2020   11:26 AM 06/09/2020   10:41 AM 06/11/2019   11:12 AM 06/05/2018   12:26 PM 11/21/2017    4:34 PM 09/15/2016  5:35 PM  PHQ 2/9 Scores  PHQ - 2 Score 0 0 0 0 0 0 0  PHQ- 9 Score  0 1   0     Fall Risk    07/16/2021   12:40 PM 06/13/2020   11:28 AM 06/09/2020   10:36 AM 07/23/2019    1:35 PM 06/11/2019   11:11 AM  Fall Risk   Falls in the past year? 0 0 0 0 0  Number falls in past yr: 0 0     Injury with Fall?  0     Follow up Falls evaluation completed Falls evaluation completed Falls evaluation completed Falls evaluation completed Falls prevention discussed    FALL RISK PREVENTION PERTAINING TO THE HOME: Home free of loose throw rugs in walkways, pet beds, electrical cords, etc? Yes  Adequate lighting in your home to reduce risk of falls? Yes   ASSISTIVE DEVICES UTILIZED TO PREVENT FALLS: Life alert? No  Use of a cane, walker or w/c? No   TIMED UP AND GO: Was the test performed? No .   Cognitive Function: Patient is alert and oriented x3.     06/11/2019   11:19 AM  MMSE - Mini Mental State Exam  Not completed: Unable to complete        07/16/2021   12:49 PM 06/13/2020   11:28 AM 06/11/2019   11:20 AM 06/05/2018   12:28 PM  6CIT Screen  What Year? 0 points 0 points 0 points 0 points  What month? 0 points 0 points 0 points 0 points  What time? 0 points 0 points 0 points 0 points  Count back from 20  0 points  0 points  Months in reverse  0 points  0 points  Repeat phrase  0 points  0 points  Total Score  0 points  0 points    Immunizations Immunization History  Administered  Date(s) Administered   Moderna Sars-Covid-2 Vaccination 06/18/2019, 07/16/2019, 02/15/2020   Tdap 12/14/2014    Shingrix Completed?: No.    Education has been provided regarding the importance of this vaccine. Patient has been advised to call insurance company to determine out of pocket expense if they have not yet received this vaccine. Advised may also receive vaccine at local pharmacy or Health Dept. Verbalized acceptance and understanding.  Screening Tests Health Maintenance  Topic Date Due   COVID-19 Vaccine (4 - Booster for Moderna series) 08/01/2021 (Originally 04/11/2020)   Zoster Vaccines- Shingrix (1 of 2) 10/15/2021 (Originally 05/03/2002)   Pneumonia Vaccine 23+ Years old (1 - PCV) 07/17/2022 (Originally 05/03/2017)   INFLUENZA VACCINE  10/23/2021   Fecal DNA (Cologuard)  05/22/2023   TETANUS/TDAP  12/13/2024   Hepatitis C Screening  Completed   HPV VACCINES  Aged Out   COLONOSCOPY (Pts 45-33yrs Insurance coverage will need to be confirmed)  Discontinued   Health Maintenance There are no preventive care reminders to display for this patient.  Lung Cancer Screening: (Low Dose CT Chest recommended if Age 110-80 years, 30 pack-year currently smoking OR have quit w/in 15years.) does not qualify.   Vision Screening: Recommended annual ophthalmology exams for early detection of glaucoma and other disorders of the eye.  Dental Screening: Recommended annual dental exams for proper oral hygiene  Community Resource Referral / Chronic Care Management: CRR required this visit?  No   CCM required this visit?  No      Plan:   Keep all routine maintenance appointments.   I have personally reviewed  and noted the following in the patient's chart:   Medical and social history Use of alcohol, tobacco or illicit drugs  Current medications and supplements including opioid prescriptions. Patient is not currently taking opioid prescriptions. Functional ability and status Nutritional  status Physical activity Advanced directives List of other physicians Hospitalizations, surgeries, and ER visits in previous 12 months Vitals Screenings to include cognitive, depression, and falls Referrals and appointments  In addition, I have reviewed and discussed with patient certain preventive protocols, quality metrics, and best practice recommendations. A written personalized care plan for preventive services as well as general preventive health recommendations were provided to patient.     Ashok Pall, LPN   1/61/0960

## 2021-07-16 NOTE — Progress Notes (Signed)
[facility] Customer service manager [facility])  ?                                          [facility] Quality Pharmacy Team  ?                                      Statin Quality Measure Assessment ?  ? ?07/16/2021 ? ?Frederick Pace. ?February 14, 1951 ?[id number] ? ? ?Per review of chart and payor information, patient has a diagnosis of cardiovascular disease but is not currently filling a statin prescription.  This places patient into the [facility] (Statin Use In Patients with Cardiovascular Disease) measure for CMS.   ? ?Patient declined statin therapy in the past. He has an upcoming follow up visit on 07/17/21.   ? ?If patient has statin intolerance, a statin exclusion code could be associated with the upcoming visit. ? ?   ?Component Value Date/Time  ? CHOL 207 (H) 07/12/2021 0859  ? TRIG 129.0 07/12/2021 0859  ? HDL 41.90 07/12/2021 0859  ? CHOLHDL 5 07/12/2021 0859  ? VLDL 25.8 07/12/2021 0859  ? LDLCALC 140 (H) 07/12/2021 0859  ? LDLCALC 147 (H) 11/21/2017 1559  ? LDLDIRECT 135.0 07/15/2018 0828  ? ? ? ?Please consider ONE of the following recommendations:  ?Initiate high intensity statin Atorvastatin 40mg  once daily, #90, 3 refills  ? Rosuvastatin 20mg  once daily, #90, 3 refills  ?  ?Initiate moderate intensity  ?statin with reduced frequency if prior  ?statin intolerance 1x weekly, #13, 3 refills  ? 2x weekly, #26, 3 refills  ? 3x weekly, #39, 3 refills  ?  ?Code for past statin intolerance  ?(required annually) ? ? Provider Requirements:   ?Must asociate code during an office visit or telehealth encounter  ? Drug Induced Myopathy G72.0  ? Myalgia M79.1  ? Myositis, unspecified M60.9  ? Myopathy, unspecified G72.9  ? Rhabdomyolysis M62.82  ? ? ? ?Plan: Route note to PCP prior to appointment. ? ? , PharmD, BCACP ?[facility] Clinical Pharmacist ?[phone] ? ? ? ? ? ? ?

## 2021-07-17 ENCOUNTER — Encounter: Payer: Self-pay | Admitting: Internal Medicine

## 2021-07-17 ENCOUNTER — Ambulatory Visit (INDEPENDENT_AMBULATORY_CARE_PROVIDER_SITE_OTHER): Payer: Medicare Other | Admitting: Internal Medicine

## 2021-07-17 VITALS — BP 122/60 | HR 59 | Temp 98.4°F | Ht 64.0 in | Wt 178.8 lb

## 2021-07-17 DIAGNOSIS — Z125 Encounter for screening for malignant neoplasm of prostate: Secondary | ICD-10-CM | POA: Diagnosis not present

## 2021-07-17 DIAGNOSIS — Z0000 Encounter for general adult medical examination without abnormal findings: Secondary | ICD-10-CM

## 2021-07-17 DIAGNOSIS — Z1283 Encounter for screening for malignant neoplasm of skin: Secondary | ICD-10-CM

## 2021-07-17 DIAGNOSIS — E782 Mixed hyperlipidemia: Secondary | ICD-10-CM | POA: Diagnosis not present

## 2021-07-17 DIAGNOSIS — I701 Atherosclerosis of renal artery: Secondary | ICD-10-CM

## 2021-07-17 DIAGNOSIS — I10 Essential (primary) hypertension: Secondary | ICD-10-CM

## 2021-07-17 MED ORDER — DOXYCYCLINE HYCLATE 100 MG PO TABS: 100.0000 mg | ORAL_TABLET | Freq: Two times a day (BID) | ORAL | 0 refills | Status: DC

## 2021-07-17 NOTE — Assessment & Plan Note (Signed)
BP is at goal on 3 drug therapy : metoprolol,  losartan and amlodipine  ?

## 2021-07-17 NOTE — Patient Instructions (Addendum)
Your renal artery doppler will be due  for repeat in November 2023 ? ?Referral to  Dr  End   for risk stratification with a coronary calcium CT  ? ?Cologuard will be due in 2025 for colon ca screening  ? ?PSA to be done today  ? ?To find you a good dog: ? ?[place] [facility]  is where we go to adopt all of our  collie (ish) dogs ? ?Doxycycline refilled  for fever and headache   ? ?Try [employer] in [place] for massage  ? ?Referral to Dr Roseanne Kaufman for a skin check [facility])  ? ?Your  fasting glucose has never been  diagnostic of diabetes; but your A1c of 5.9 suggests that you may be at  slightly increased  risk for developing type 2 Diabetes over the next ten years.  Reducing your daily intake of starches to 2 servings daily , eliminating refined sugars and getting back to a daily exercise regimen  has been shown to delay the progression to diabetes.   I would like to repeat NON FASTING labs In 6 months. ? ?

## 2021-07-17 NOTE — Assessment & Plan Note (Signed)
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  ?

## 2021-07-17 NOTE — Assessment & Plan Note (Signed)
PSAs have been deferred since 2021 for unclear reasons ? ?age appropriate education and counseling updated, referrals for preventative services and immunizations addressed, dietary and smoking counseling addressed, most recent labs reviewed.  I have personally reviewed and have noted: ?  ?1) the patient's medical and social history ?2) The pt's use of alcohol, tobacco, and illicit drugs ?3) The patient's current medications and supplements ?4) Functional ability including ADL's, fall risk, home safety risk, hearing and visual impairment ?5) Diet and physical activities ?6) Evidence for depression or mood disorder ?7) The patient's height, weight, and BMI have been recorded in the chart ?8) I have ordered and reviewed a 12 lead EKG and find that there are no acute changes and patient is in sinus rhythm.   ?  ?I have made referrals, and provided counseling and education based on review of the above ?

## 2021-07-17 NOTE — Progress Notes (Signed)
Patient ID: Frederick HarborMarshall P Quadros Jr., male    DOB: 07/13/1950  Age: 71 y.o. MRN: [id number] ? ?The patient is here for annual preventive examination and management of other chronic and acute problems. ? ?Cologuard negative Feb 20222 ?PSA overdue ?Fasting labs done prior to visit  ?  ?The risk factors are reflected in the social history. ? ?The roster of all physicians providing medical care to patient - is listed in the Snapshot section of the chart. ? ?Activities of daily living:  The patient is 100% independent in all ADLs: dressing, toileting, feeding as well as independent mobility ? ?Home safety : The patient has smoke detectors in the home. They wear seatbelts.  There are no firearms at home. There is no violence in the home.  ? ?There is no risks for hepatitis, STDs or HIV. There is no   history of blood transfusion. They have no travel history to infectious disease endemic areas of the world. ? ?The patient has seen their dentist in the last six month. They have seen their eye doctor in the last year. They admit to slight hearing difficulty with regard to whispered voices and some television programs.  They have deferred audiologic testing in the last year.  They do not  have excessive sun exposure. Discussed the need for sun protection: hats, long sleeves and use of sunscreen if there is significant sun exposure.  ? ?Diet: the importance of a healthy diet is discussed. They do have a healthy diet. ? ?The benefits of regular aerobic exercise were discussed. She walks 4 times per week ,  20 minutes.  ? ?Depression screen: there are no signs or vegative symptoms of depression- irritability, change in appetite, anhedonia, sadness/tearfullness. ? ?Cognitive assessment: the patient manages all their financial and personal affairs and is actively engaged. They could relate day,date,year and events; recalled 2/3 objects at 3 minutes; performed clock-face test normally. ? ?The following portions of the patient's history  were reviewed and updated as appropriate: allergies, current medications, past family history, past medical history,  past surgical history, past social history  and problem list. ? ?Visual acuity was not assessed per patient preference since she has regular follow up with her ophthalmologist. Hearing and body mass index were assessed and reviewed.  ? ?During the course of the visit the patient was educated and counseled about appropriate screening and preventive services including : fall prevention , diabetes screening, nutrition counseling, colorectal cancer screening, and recommended immunizations.   ? ?CC: The primary encounter diagnosis was Prostate cancer screening. Diagnoses of Visit for preventive health examination, Mixed hyperlipidemia, Renal artery stenosis (HCC), Skin cancer screening, and White coat syndrome with hypertension were also pertinent to this visit. ? ?1) hyperlipidemia:  continues to refuse statin therapy in favor of continuing RYR .  discussed cardiology referral  ? ?2) no longer having back pain without surgery;  but having neck pain, bilateral numbness, burning symptoms of hands at night,  and decreased strength and dexterity of both hands,  but declines EMG/nerve conduction  studies  has had cervical spine MRI by Lovell SheehanJenkins   ? ? ?History ?Frederick Pace has a past medical history of HTN (hypertension) and Other and unspecified hyperlipidemia.  ? ?He has a past surgical history that includes Anterior cruciate ligament repair (Left, 2004) and RENAL ANGIOGRAPHY (Right, 01/08/2018).  ? ?His family history includes Aortic aneurysm in his father; Dementia in his mother; Heart disease in his paternal uncle; Heart disease (age of onset: 5358) in his father;  Hyperlipidemia in an other family member; Mitral valve prolapse in his father.He reports that he has never smoked. He has never used smokeless tobacco. He reports current alcohol use. He reports that he does not use drugs. ? ?Outpatient Medications  Prior to Visit  ?Medication Sig Dispense Refill  ? amLODipine (NORVASC) 10 MG tablet TAKE 1 TABLET BY MOUTH DAILY 90 tablet 0  ? losartan (COZAAR) 100 MG tablet TAKE 1 TABLET BY MOUTH DAILY 90 tablet 1  ? metoprolol succinate (TOPROL-XL) 25 MG 24 hr tablet TAKE 1 TABLET BY MOUTH DAILY. 90 tablet 1  ? Red Yeast Rice Extract 600 MG CAPS Take 1 capsule by mouth 2 (two) times daily.     ? atorvastatin (LIPITOR) 20 MG tablet Take 1 tablet (20 mg total) by mouth daily. (Patient not taking: Reported on 07/17/2021) 90 tablet 3  ? doxycycline (VIBRA-TABS) 100 MG tablet Take 1 tablet (100 mg total) by mouth 2 (two) times daily. (Patient not taking: Reported on 07/17/2021) 20 tablet 0  ? spironolactone (ALDACTONE) 50 MG tablet Take 1 tablet (50 mg total) by mouth daily as needed (fluid retention). (Patient not taking: Reported on 07/17/2021) 30 tablet 2  ? ?No facility-administered medications prior to visit.  ? ? ?Review of Systems ? ?Patient denies headache, fevers, malaise, unintentional weight loss, skin rash, eye pain, sinus congestion and sinus pain, sore throat, dysphagia,  hemoptysis , cough, dyspnea, wheezing, chest pain, palpitations, orthopnea, edema, abdominal pain, nausea, melena, diarrhea, constipation, flank pain, dysuria, hematuria, urinary  Frequency, nocturia, numbness, tingling, seizures,  Focal weakness, Loss of consciousness,  Tremor, insomnia, depression, anxiety, and suicidal ideation.   ? ? ?Objective:  ?BP 122/60 (BP Location: Left Arm, Patient Position: Sitting, Cuff Size: Normal)   Pulse (!) 59   Temp 98.4 ?F (36.9 ?C) (Oral)   Ht 5\' 4"  (1.626 m)   Wt 178 lb 12.8 oz (81.1 kg)   SpO2 97%   BMI 30.69 kg/m?  ? ?Physical Exam ? ?General appearance: alert, cooperative and appears stated age ?Ears: normal TM's and external ear canals both ears ?Throat: lips, mucosa, and tongue normal; teeth and gums normal ?Neck: no adenopathy, no carotid bruit, supple, symmetrical, trachea midline and thyroid not  enlarged, symmetric, no tenderness/mass/nodules ?Back: symmetric, no curvature. ROM normal. No CVA tenderness. ?Lungs: clear to auscultation bilaterally ?Heart: regular rate and rhythm, S1, S2 normal, no murmur, click, rub or gallop ?Abdomen: soft, non-tender; bowel sounds normal; no masses,  no organomegaly ?Pulses: 2+ and symmetric ?Skin: Skin color, texture, turgor normal. No rashes or lesions ?Lymph nodes: Cervical, supraclavicular, and axillary nodes normal.  ? ?Assessment & Plan:  ? ?Problem List Items Addressed This Visit   ? ? Prostate cancer screening - Primary (Chronic)  ? Relevant Orders  ? PSA, Medicare  ? Mixed hyperlipidemia  ?  10 yr risk is > 30% but he he declined statin therapy.  Referring to cardiology for risk stratification with coronary calcium CT  ? ?  ?  ? Relevant Orders  ? Ambulatory referral to Cardiology  ? Renal artery stenosis (HCC)  ?  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  ? ?  ?  ? Visit for preventive health examination  ?  PSAs have been deferred since 2021 for unclear reasons ? ?age appropriate education and counseling updated, referrals for preventative services and immunizations addressed, dietary and smoking counseling addressed, most recent labs reviewed.  I  have personally reviewed and have noted: ?  ?1) the patient's medical and social history ?2) The pt's use of alcohol, tobacco, and illicit drugs ?3) The patient's current medications and supplements ?4) Functional ability including ADL's, fall risk, home safety risk, hearing and visual impairment ?5) Diet and physical activities ?6) Evidence for depression or mood disorder ?7) The patient's height, weight, and BMI have been recorded in the chart ?8) I have ordered and reviewed a 12 lead EKG and find that there are no acute changes and patient is in sinus rhythm.   ?  ?I have made referrals, and provided counseling and education based on review of the  above ?  ?  ? White coat syndrome with hypertension  ?  BP is at goal on 3 drug therapy : metoprolol,  losartan and amlodipine  ? ?  ?  ? ?Other Visit Diagnoses   ? ? Skin cancer screening      ? Relevant Orders  ? Ambula

## 2021-07-17 NOTE — Assessment & Plan Note (Signed)
10 yr risk is > 30% but he he declined statin therapy.  Referring to cardiology for risk stratification with coronary calcium CT  ?

## 2021-07-18 LAB — PSA, MEDICARE: PSA: 0.29 ng/ml (ref 0.10–4.00)

## 2021-08-17 ENCOUNTER — Ambulatory Visit: Payer: Medicare Other | Admitting: Internal Medicine

## 2021-08-17 NOTE — Progress Notes (Unsigned)
New Outpatient Visit Date: 08/17/2021  Referring Provider: Sherlene Shams, MD [address] [place],  [place] [address]  Chief Complaint: ***  HPI:  Frederick Pace is a 71 y.o. male who is being seen today for the evaluation of hyperlipidemia at the request of Dr. Darrick Huntsman. He has a history of hypertension with renal artery stenosis and hyperlipidemia.  I saw him once in our office in 2018 for evaluation of hypertension.  We initiated HCTZ and agreed to obtain an echocardiogram, which did not show any significant abnormalities.  He subsequently underwent evaluation for renovascular hypertension by Dr. Debroah Loop.  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  Subsequent renal angiogram confirmed solitary kidney with no significant stenosis involving the right renal artery.  He has not followed up with [place] since his initial consultation in 2018. ***  Elevated 10-year risk but patient declined statin.  Here for risk stratification.  --------------------------------------------------------------------------------------------------  Cardiovascular History & Procedures: Cardiovascular Problems: Hypertension Hyperlipidemia  Risk Factors: Hypertension, hyperlipidemia, male gender, and age greater than 52  Cath/PCI: None  CV Surgery: Right renal angiogram (01/08/2018): No significant renal artery stenosis.  Aorta, mesenteric arteries, and iliac arteries widely patent.  EP Procedures and Devices: None  Non-Invasive Evaluation(s): Renal artery Doppler (08/15/2020): No evidence of right renal artery stenosis.  Left kidney is absent. Renal artery Doppler (02/16/2019): 1-59% stenosis of right renal artery.  Left kidney is absent. Renal artery Doppler (11/28/2017): Greater than 60% stenosis of the right renal artery.  Left kidney is absent. TTE (01/10/2017): Normal LV size.  LVEF 55-60% with grade 1 diastolic dysfunction.  Normal RV size and  function.  Mild aortic and mitral regurgitation.  Recent CV Pertinent Labs: Lab Results  Component Value Date   CHOL 207 (H) 07/12/2021   HDL 41.90 07/12/2021   LDLCALC 140 (H) 07/12/2021   LDLCALC 147 (H) 11/21/2017   LDLDIRECT 135.0 07/15/2018   TRIG 129.0 07/12/2021   CHOLHDL 5 07/12/2021   K 4.5 07/12/2021   BUN 21 07/12/2021   BUN 17 01/09/2017   CREATININE 1.03 07/12/2021   CREATININE 1.00 11/21/2017    --------------------------------------------------------------------------------------------------  Past Medical History:  Diagnosis Date   HTN (hypertension)    Other and unspecified hyperlipidemia     Past Surgical History:  Procedure Laterality Date   ANTERIOR CRUCIATE LIGAMENT REPAIR Left 2004   RENAL ANGIOGRAPHY Right 01/08/2018   Procedure: RENAL ANGIOGRAPHY;  Surgeon: Annice Needy, MD;  Location: [facility] INVASIVE CV LAB;  Service: Cardiovascular;  Laterality: Right;    No outpatient medications have been marked as taking for the 08/17/21 encounter (Appointment) with Juanitta Earnhardt, Cristal Deer, MD.    Allergies: Patient has no known allergies.  Social History   Tobacco Use   Smoking status: Never   Smokeless tobacco: Never  Vaping Use   Vaping Use: Never used  Substance Use Topics   Alcohol use: Yes    Alcohol/week: 0.0 standard drinks    Comment: 1 drink every few months   Drug use: No    Comment: Refused to answer    Family History  Problem Relation Age of Onset   Dementia Mother    Heart disease Father 73       CABG   Mitral valve prolapse Father    Aortic aneurysm Father    Heart disease Paternal Uncle        Valve replacements   Hyperlipidemia Other  family Hx    Review of Systems: A 12-system review of systems was performed and was negative except as noted in the HPI.  --------------------------------------------------------------------------------------------------  Physical Exam: There were no vitals taken for this visit.  General:   *** HEENT: No conjunctival pallor or scleral icterus. Facemask in place. Neck: Supple without lymphadenopathy, thyromegaly, JVD, or HJR. No carotid bruit. Lungs: Normal work of breathing. Clear to auscultation bilaterally without wheezes or crackles. Heart: Regular rate and rhythm without murmurs, rubs, or gallops. Non-displaced PMI. Abd: Bowel sounds present. Soft, NT/ND without hepatosplenomegaly Ext: No lower extremity edema. Radial, PT, and DP pulses are 2+ bilaterally Skin: Warm and dry without rash. Neuro: CNIII-XII intact. Strength and fine-touch sensation intact in upper and lower extremities bilaterally. Psych: Normal mood and affect.  EKG:  ***  Lab Results  Component Value Date   WBC 7.1 07/12/2021   HGB 15.5 07/12/2021   HCT 46.1 07/12/2021   MCV 91.5 07/12/2021   PLT 228.0 07/12/2021    Lab Results  Component Value Date   NA 136 07/12/2021   K 4.5 07/12/2021   CL 101 07/12/2021   CO2 27 07/12/2021   BUN 21 07/12/2021   CREATININE 1.03 07/12/2021   GLUCOSE 101 (H) 07/12/2021   ALT 22 07/12/2021    Lab Results  Component Value Date   CHOL 207 (H) 07/12/2021   HDL 41.90 07/12/2021   LDLCALC 140 (H) 07/12/2021   LDLDIRECT 135.0 07/15/2018   TRIG 129.0 07/12/2021   CHOLHDL 5 07/12/2021     --------------------------------------------------------------------------------------------------  ASSESSMENT AND PLAN: Cristal Deer Evyn Putzier, MD 08/17/2021 8:02 AM

## 2021-09-11 ENCOUNTER — Other Ambulatory Visit: Payer: Self-pay | Admitting: Family

## 2021-09-28 ENCOUNTER — Ambulatory Visit: Payer: Medicare Other | Admitting: Internal Medicine

## 2021-10-05 ENCOUNTER — Other Ambulatory Visit: Payer: Self-pay | Admitting: Internal Medicine

## 2021-12-11 ENCOUNTER — Telehealth: Payer: Self-pay

## 2021-12-11 NOTE — Telephone Encounter (Signed)
Last tdap was 2016. Should pt get a Td since he stepped on a rusty screw this morning?

## 2021-12-11 NOTE — Telephone Encounter (Signed)
Patient states he stepped on a rusty screw this morning and needs to know if he is current on his tetanus shot.  Please call.

## 2021-12-11 NOTE — Telephone Encounter (Signed)
Spoke with pt and informed him that Dr. Derrel Nip does recommend that he get one since it has been over 5 years since his last one. Pt gave a verbal understanding and stated he would go to his pharmacy to get tomorrow.

## 2021-12-12 ENCOUNTER — Telehealth: Payer: Self-pay

## 2021-12-12 MED ORDER — METOPROLOL SUCCINATE ER 25 MG PO TB24: 25.0000 mg | ORAL_TABLET | Freq: Every day | ORAL | 1 refills | Status: DC

## 2021-12-12 NOTE — Telephone Encounter (Signed)
Medication has been refilled and pt is aware.  

## 2021-12-12 NOTE — Telephone Encounter (Signed)
Patient states he needs a refill for his metoprolol succinate (TOPROL-XL) 25 MG 24 hr tablet.  Patient states he is out of this medication.  *Patient states his preferred pharmacy [facility].

## 2022-01-02 ENCOUNTER — Ambulatory Visit: Payer: Medicare Other | Attending: Internal Medicine | Admitting: Internal Medicine

## 2022-01-02 ENCOUNTER — Encounter: Payer: Self-pay | Admitting: Internal Medicine

## 2022-01-02 VITALS — BP 120/80 | HR 74 | Ht 64.0 in | Wt 182.2 lb

## 2022-01-02 DIAGNOSIS — I080 Rheumatic disorders of both mitral and aortic valves: Secondary | ICD-10-CM | POA: Diagnosis not present

## 2022-01-02 DIAGNOSIS — I10 Essential (primary) hypertension: Secondary | ICD-10-CM

## 2022-01-02 DIAGNOSIS — E7800 Pure hypercholesterolemia, unspecified: Secondary | ICD-10-CM | POA: Diagnosis not present

## 2022-01-02 DIAGNOSIS — I4510 Unspecified right bundle-branch block: Secondary | ICD-10-CM

## 2022-01-02 NOTE — Patient Instructions (Addendum)
Medication Instructions:   NONE  *If you need a refill on your cardiac medications before your next appointment, please call your pharmacy*   Lab Work:  NONE  If you have labs (blood work) drawn today and your tests are completely normal, you will receive your results only by: [place] (if you have MyChart) OR A paper copy in the mail If you have any lab test that is abnormal or we need to change your treatment, we will call you to review the results.   Testing/Procedures:  Your physician has requested that you have an echocardiogram. Echocardiography is a painless test that uses sound waves to create images of your heart. It provides your doctor with information about the size and shape of your heart and how well your heart's chambers and valves are working. This procedure takes approximately one hour. There are no restrictions for this procedure.  CT Coronary Calcium Score:  Your physician has recommended that you have CT Coronary Calcium Score.  - $99 out of pocket cost at the time of your test - Call [phone] to schedule at your convenience.  Location: [place], [place] [address]    Follow-Up: At [facility], you and your health needs are our priority.  As part of our continuing mission to provide you with exceptional heart care, we have created designated Provider Care Teams.  These Care Teams include your primary Cardiologist (physician) and Advanced Practice Providers (APPs -  Physician Assistants and Nurse Practitioners) who all work together to provide you with the care you need, when you need it.  We recommend signing up for the patient portal called "MyChart".  Sign up information is provided on this After Visit Summary.  MyChart is used to connect with patients for Virtual Visits (Telemedicine).  Patients are able to view lab/test results, encounter notes, upcoming appointments, etc.   Non-urgent messages can be sent to your provider as well.   To learn more about what you can do with MyChart, go to [email or web address].    Your next appointment:   12 month(s)  The format for your next appointment:   In Person  Provider:   You may see Dr. Harrell Gave End or one of the following Advanced Practice Providers on your designated Care Team:   Murray Hodgkins, NP Christell Faith, PA-C Cadence Kathlen Mody, PA-C Gerrie Nordmann, NP   Important Information About Sugar

## 2022-01-02 NOTE — Progress Notes (Signed)
New Outpatient Visit Date: 01/02/2022  Referring Provider: Sherlene Shams, MD [address] [place],  [place] [address]  Chief Complaint: Hypertension and hyperlipidemia  HPI:  Frederick Pace is a 71 y.o. male who is being seen today for the evaluation of hypertension and hyperlipidemia at the request of Sherlene Shams, MD. He has a history of hypertension, hyperlipidemia, and solitary kidney.  I saw him for evaluation of hypertension and hyperlipidemia in 2018.  I recommended adding HCTZ due to suboptimal blood pressure control as well as obtaining an echocardiogram in the setting of abnormal EKG.  Echocardiogram showed normal LVEF with normal wall motion and grade 1 diastolic dysfunction.  Mild mitral and aortic regurgitation were noted.  He was lost to follow-up thereafter.  Today, Frederick Pace reports that he is feeling well.  His only complaint is of some pain in his low back, which he believes was exacerbated by painting in his kitchen.  He was previously told that he has some nerve inflammation in his low back but was not interested in surgical intervention.  He denies chest pain, palpitations, lightheadedness, or edema.  He has a long history of intermittent vertigo, which is brief and self-limited.  He reports some exertional dyspnea when running several 100 yards, which has been stable for several years.  He does not have any dyspnea with his usual activities.  He notes a history of hyperlipidemia but is reluctant to consider statin therapy.  He is on red yeast rice and feels like this is helping him.  His home blood pressure has been well controlled on his current regimen.  --------------------------------------------------------------------------------------------------  Cardiovascular History & Procedures: Cardiovascular Problems: Abnormal EKG Aortic and mitral regurgitation  Risk Factors: Hypertension, hyperlipidemia, male gender, age greater than 7, and family  history  Cath/PCI: None  CV Surgery: Renal angiography (12/29/2017, Dr. Wyn Quaker): Solitary right kidney with no significant stenosis.  Widely patent aorta, iliac arteries, and mesenteric arteries.  EP Procedures and Devices: None  Non-Invasive Evaluation(s): TTE (01/10/2017): Normal LV size and wall thickness.  LVEF 50-60% with grade 1 diastolic dysfunction.  Mild mitral and aortic regurgitation.  Normal RV size and function.  Recent CV Pertinent Labs: Lab Results  Component Value Date   CHOL 207 (H) 07/12/2021   HDL 41.90 07/12/2021   LDLCALC 140 (H) 07/12/2021   LDLCALC 147 (H) 11/21/2017   LDLDIRECT 135.0 07/15/2018   TRIG 129.0 07/12/2021   CHOLHDL 5 07/12/2021   K 4.5 07/12/2021   BUN 21 07/12/2021   BUN 17 01/09/2017   CREATININE 1.03 07/12/2021   CREATININE 1.00 11/21/2017    --------------------------------------------------------------------------------------------------  Past Medical History:  Diagnosis Date   Aortic regurgitation    HTN (hypertension)    Mitral regurgitation    Other and unspecified hyperlipidemia     Past Surgical History:  Procedure Laterality Date   ANTERIOR CRUCIATE LIGAMENT REPAIR Left 2004   RENAL ANGIOGRAPHY Right 01/08/2018   Procedure: RENAL ANGIOGRAPHY;  Surgeon: Annice Needy, MD;  Location: [facility] INVASIVE CV LAB;  Service: Cardiovascular;  Laterality: Right;    Current Meds  Medication Sig   amLODipine (NORVASC) 10 MG tablet TAKE 1 TABLET BY MOUTH DAILY   doxycycline (VIBRA-TABS) 100 MG tablet Take 1 tablet (100 mg total) by mouth 2 (two) times daily. (Patient taking differently: Take 100 mg by mouth 2 (two) times daily as needed (for tick bites).)   losartan (COZAAR) 100 MG tablet TAKE 1 TABLET BY MOUTH DAILY   metoprolol  succinate (TOPROL-XL) 25 MG 24 hr tablet Take 1 tablet (25 mg total) by mouth daily.   Red Yeast Rice Extract 600 MG CAPS Take 1 capsule by mouth 2 (two) times daily.     Allergies: Patient has no known  allergies.  Social History   Tobacco Use   Smoking status: Never   Smokeless tobacco: Never  Vaping Use   Vaping Use: Never used  Substance Use Topics   Alcohol use: Yes    Alcohol/week: 0.0 standard drinks of alcohol    Comment: 1 drink every few months   Drug use: No    Family History  Problem Relation Age of Onset   Dementia Mother    Heart disease Father 37       CABG   Mitral valve prolapse Father    Aortic aneurysm Father    Heart disease Paternal Uncle        Valve replacements   Hyperlipidemia Other        family Hx    Review of Systems: A 12-system review of systems was performed and was negative except as noted in the HPI.  --------------------------------------------------------------------------------------------------  Physical Exam: BP 120/80 (BP Location: Left Arm, Patient Position: Sitting, Cuff Size: Normal)   Pulse 74   Ht 5\' 4"  (1.626 m)   Wt 182 lb 3.2 oz (82.6 kg)   SpO2 98%   BMI 31.27 kg/m   General: NAD. HEENT: No conjunctival pallor or scleral icterus. Neck: Supple without lymphadenopathy, thyromegaly, JVD, or HJR. No carotid bruit. Lungs: Normal work of breathing. Clear to auscultation bilaterally without wheezes or crackles. Heart: Regular rate and rhythm with 1/6 systolic murmur.  No rubs or gallops. Abd: Bowel sounds present. Soft, NT/ND without hepatosplenomegaly Ext: No lower extremity edema. Radial, PT, and DP pulses are 2+ bilaterally Skin: Warm and dry without rash. Neuro: CNIII-XII intact. Strength and fine-touch sensation intact in upper and lower extremities bilaterally. Psych: Normal mood and affect.  EKG: Normal sinus rhythm with right axis deviation and right bundle branch block.  No significant change from prior tracing on 12/10/2016.  Lab Results  Component Value Date   WBC 7.1 07/12/2021   HGB 15.5 07/12/2021   HCT 46.1 07/12/2021   MCV 91.5 07/12/2021   PLT 228.0 07/12/2021    Lab Results  Component Value  Date   NA 136 07/12/2021   K 4.5 07/12/2021   CL 101 07/12/2021   CO2 27 07/12/2021   BUN 21 07/12/2021   CREATININE 1.03 07/12/2021   GLUCOSE 101 (H) 07/12/2021   ALT 22 07/12/2021    Lab Results  Component Value Date   CHOL 207 (H) 07/12/2021   HDL 41.90 07/12/2021   LDLCALC 140 (H) 07/12/2021   LDLDIRECT 135.0 07/15/2018   TRIG 129.0 07/12/2021   CHOLHDL 5 07/12/2021     --------------------------------------------------------------------------------------------------  ASSESSMENT AND PLAN: Hyperlipidemia: Frederick Pace has a history of mildly elevated LDL, most recently 140.  His 10-year ASCVD risk is 19.4%, placing him in the intermediate risk category.  We discussed the risks and benefits of statin therapy for primary prevention.  Frederick Pace is not eager to switch from red yeast rice to a statin.  However, we have agreed to obtain a coronary calcium score for additional risk stratification.  If significant CAC is identified, he would be amenable to trying a statin.  Hypertension: Blood pressure well controlled.  Continue current regimen of amlodipine, losartan, and metoprolol succinate.  Aortic and mitral regurgitation: Mild aortic and  mitral regurgitation noted on echocardiogram in 2018.  We will repeat an echocardiogram to ensure that this has not progressed.  Right bundle branch block: Chronic and unchanged from prior tracing in 2018.  As above, we will repeat an echocardiogram to ensure that his valvular heart disease has not progressed and that there is not a new cardiomyopathy.  Follow-up: Return to clinic in 12 months.  Yvonne Kendall, MD 01/02/2022 3:09 PM

## 2022-01-03 ENCOUNTER — Encounter: Payer: Self-pay | Admitting: Internal Medicine

## 2022-01-03 DIAGNOSIS — E7800 Pure hypercholesterolemia, unspecified: Secondary | ICD-10-CM | POA: Insufficient documentation

## 2022-01-03 DIAGNOSIS — I4510 Unspecified right bundle-branch block: Secondary | ICD-10-CM | POA: Insufficient documentation

## 2022-01-09 ENCOUNTER — Other Ambulatory Visit: Payer: Self-pay | Admitting: Internal Medicine

## 2022-02-11 ENCOUNTER — Other Ambulatory Visit (INDEPENDENT_AMBULATORY_CARE_PROVIDER_SITE_OTHER): Payer: Self-pay | Admitting: Vascular Surgery

## 2022-02-11 DIAGNOSIS — I701 Atherosclerosis of renal artery: Secondary | ICD-10-CM

## 2022-02-12 ENCOUNTER — Encounter (INDEPENDENT_AMBULATORY_CARE_PROVIDER_SITE_OTHER): Payer: Medicare Other

## 2022-02-12 ENCOUNTER — Ambulatory Visit (INDEPENDENT_AMBULATORY_CARE_PROVIDER_SITE_OTHER): Payer: Medicare Other | Admitting: Vascular Surgery

## 2022-02-13 ENCOUNTER — Other Ambulatory Visit: Payer: Self-pay | Admitting: Internal Medicine

## 2022-02-13 DIAGNOSIS — I10 Essential (primary) hypertension: Secondary | ICD-10-CM

## 2022-02-13 DIAGNOSIS — I4510 Unspecified right bundle-branch block: Secondary | ICD-10-CM

## 2022-02-13 DIAGNOSIS — E7800 Pure hypercholesterolemia, unspecified: Secondary | ICD-10-CM

## 2022-02-13 DIAGNOSIS — I080 Rheumatic disorders of both mitral and aortic valves: Secondary | ICD-10-CM

## 2022-02-21 ENCOUNTER — Ambulatory Visit: Payer: Medicare Other | Attending: Internal Medicine

## 2022-02-21 DIAGNOSIS — I080 Rheumatic disorders of both mitral and aortic valves: Secondary | ICD-10-CM

## 2022-02-21 DIAGNOSIS — I4510 Unspecified right bundle-branch block: Secondary | ICD-10-CM

## 2022-02-21 LAB — ECHOCARDIOGRAM COMPLETE
Area-P 1/2: 3.56 cm2
P 1/2 time: 380 msec
S' Lateral: 3.7 cm

## 2022-03-05 ENCOUNTER — Other Ambulatory Visit: Payer: Self-pay | Admitting: Internal Medicine

## 2022-07-03 ENCOUNTER — Other Ambulatory Visit: Payer: Self-pay | Admitting: Internal Medicine

## 2022-07-09 ENCOUNTER — Ambulatory Visit (INDEPENDENT_AMBULATORY_CARE_PROVIDER_SITE_OTHER): Payer: Medicare Other | Admitting: Vascular Surgery

## 2022-07-09 ENCOUNTER — Ambulatory Visit (INDEPENDENT_AMBULATORY_CARE_PROVIDER_SITE_OTHER): Payer: Medicare Other

## 2022-07-09 ENCOUNTER — Encounter (INDEPENDENT_AMBULATORY_CARE_PROVIDER_SITE_OTHER): Payer: Self-pay | Admitting: Vascular Surgery

## 2022-07-09 VITALS — BP 131/82 | HR 82 | Resp 18 | Wt 185.2 lb

## 2022-07-09 DIAGNOSIS — I701 Atherosclerosis of renal artery: Secondary | ICD-10-CM | POA: Diagnosis not present

## 2022-07-09 DIAGNOSIS — I10 Essential (primary) hypertension: Secondary | ICD-10-CM | POA: Diagnosis not present

## 2022-07-09 DIAGNOSIS — E782 Mixed hyperlipidemia: Secondary | ICD-10-CM | POA: Diagnosis not present

## 2022-07-09 NOTE — Progress Notes (Signed)
MRN : [id number]  Frederick Pace. is a 72 y.o. (08/05/1950) male who presents with chief complaint of  Chief Complaint  Patient presents with   Follow-up    Follow up 61yr. renal.  .  History of Present Illness: Patient returns today in follow up of renal artery stenosis.  He has a known solitary right kidney.  He is on 3 blood pressure medications.  Several years ago he had a renal angiogram which did not show any hemodynamically significant stenosis in his solitary right renal artery.  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  Duplex today showed no hemodynamically significant stenosis in the right renal artery with a normal kidney length.  Left kidney is absent.  Current Outpatient Medications  Medication Sig Dispense Refill   amLODipine (NORVASC) 10 MG tablet TAKE 1 TABLET BY MOUTH DAILY 90 tablet 2   losartan (COZAAR) 100 MG tablet TAKE 1 TABLET BY MOUTH DAILY 90 tablet 1   metoprolol succinate (TOPROL-XL) 25 MG 24 hr tablet TAKE ONE TABLET BY MOUTH EVERY DAY 90 tablet 1   Red Yeast Rice Extract 600 MG CAPS Take 1 capsule by mouth 2 (two) times daily.      doxycycline (VIBRA-TABS) 100 MG tablet Take 1 tablet (100 mg total) by mouth 2 (two) times daily. (Patient not taking: Reported on 07/09/2022) 20 tablet 0   No current facility-administered medications for this visit.    Past Medical History:  Diagnosis Date   Aortic regurgitation    HTN (hypertension)    Mitral regurgitation    Other and unspecified hyperlipidemia     Past Surgical History:  Procedure Laterality Date   ANTERIOR CRUCIATE LIGAMENT REPAIR Left 2004   RENAL ANGIOGRAPHY Right 01/08/2018   Procedure: RENAL ANGIOGRAPHY;  Surgeon: Annice Needy, MD;  Location: [facility] INVASIVE CV LAB;  Service: Cardiovascular;  Laterality: Right;     Social History   Tobacco Use   Smoking status: Never   Smokeless tobacco: Never  Vaping Use   Vaping Use: Never used  Substance Use Topics   Alcohol use: Yes     Alcohol/week: 0.0 standard drinks of alcohol    Comment: 1 drink every few months   Drug use: No      Family History  Problem Relation Age of Onset   Dementia Mother    Heart disease Father 58       CABG   Mitral valve prolapse Father    Aortic aneurysm Father    Heart disease Paternal Uncle        Valve replacements   Hyperlipidemia Other        family Hx     No Known Allergies   REVIEW OF SYSTEMS (Negative unless checked)  Constitutional: Weight loss  Fever  Chills Cardiac: Chest pain   Chest pressure   Palpitations   Shortness of breath when laying flat   Shortness of breath at rest   Shortness of breath with exertion. Vascular:  Pain in legs with walking   Pain in legs at rest   Pain in legs when laying flat   Claudication   Pain in feet when walking  Pain in feet at rest  Pain in feet when laying flat   History of DVT   Phlebitis   Swelling in legs   Varicose veins   Non-healing ulcers Pulmonary:   Uses home oxygen   Productive cough   Hemoptysis   Wheeze  COPD   Asthma Neurologic:    Dizziness  Blackouts   Seizures   History of stroke   History of TIA  Aphasia   Temporary blindness   Dysphagia   Weakness or numbness in arms   Weakness or numbness in legs Musculoskeletal:  Arthritis   Joint swelling   Joint pain   Low back pain Hematologic:  Easy bruising  Easy bleeding   Hypercoagulable state   Anemic   Gastrointestinal:  Blood in stool   Vomiting blood  Gastroesophageal reflux/heartburn   Abdominal pain Genitourinary:  Chronic kidney disease   Difficult urination  Frequent urination  Burning with urination   Hematuria Skin:  Rashes   Ulcers   Wounds Psychological:  History of anxiety    History of major depression.  Physical Examination  BP 131/82 (BP Location: Left Arm)   Pulse 82   Resp 18   Wt 185 lb 3.2 oz (84 kg)   BMI 31.79 kg/m  Gen:  WD/WN,  NAD Head: [place]/AT, No temporalis wasting. Ear/Nose/Throat: Hearing grossly intact, nares w/o erythema or drainage Eyes: Conjunctiva clear. Sclera non-icteric Neck: Supple.  Trachea midline Pulmonary:  Good air movement, no use of accessory muscles.  Cardiac: RRR, no JVD Vascular:  Vessel Right Left  Radial Palpable Palpable               Musculoskeletal: M/S 5/5 throughout.  No deformity or atrophy. No edema. Arthritic changes to his hands Neurologic: Sensation grossly intact in extremities.  Symmetrical.  Speech is fluent.  Psychiatric: Judgment intact, Mood & affect appropriate for pt's clinical situation. Dermatologic: No rashes or ulcers noted.  No cellulitis or open wounds.      Labs No results found for this or any previous visit (from the past 2160 hour(s)).  Radiology No results found.  Assessment/Plan Mixed hyperlipidemia lipid control important in reducing the progression of atherosclerotic disease.   Essential hypertension Blood pressure control is improved.  This is important in reducing progression of atherosclerotic disease.   Renal artery stenosis (HCC) Duplex today is performed for follow-up with no hemodynamically significant right renal artery stenosis of the solitary kidney.  With a solitary kidney, I think checking this every year to 2 years with duplex will be prudent and we will continue to follow him with duplex over these intervals.    Festus Barren, MD  07/09/2022 3:13 PM    This note was created with Dragon medical transcription system.  Any errors from dictation are purely unintentional

## 2022-07-19 ENCOUNTER — Encounter: Payer: Medicare Other | Admitting: Internal Medicine

## 2022-07-30 ENCOUNTER — Telehealth: Payer: Self-pay | Admitting: Internal Medicine

## 2022-07-30 NOTE — Telephone Encounter (Signed)
[place] called stating the pt has a gap in care because he has no records of being on a moderate or high intensity statin. Pt also has cardio vascular disease

## 2022-07-30 NOTE — Telephone Encounter (Signed)
Noted. Pt is scheduled for 09/10/2022.

## 2022-08-13 ENCOUNTER — Ambulatory Visit (INDEPENDENT_AMBULATORY_CARE_PROVIDER_SITE_OTHER): Payer: Medicare Other

## 2022-08-13 VITALS — Wt 185.0 lb

## 2022-08-13 DIAGNOSIS — Z0000 Encounter for general adult medical examination without abnormal findings: Secondary | ICD-10-CM | POA: Diagnosis not present

## 2022-08-13 NOTE — Patient Instructions (Addendum)
Frederick Pace , Thank you for taking time to come for your Medicare Wellness Visit. I appreciate your ongoing commitment to your health goals. Please review the following plan we discussed and let me know if I can assist you in the future.   These are the goals we discussed:  Goals      DIET - EAT MORE FRUITS AND VEGETABLES     DIET - INCREASE LEAN PROTEINS     Low carb foods. Healthy snack. Portion control.        This is a list of the screening recommended for you and due dates:  Health Maintenance  Topic Date Due   Zoster (Shingles) Vaccine (1 of 2) Never done   Pneumonia Vaccine (1 of 1 - PCV) Never done   COVID-19 Vaccine (4 - 2023-24 season) 11/23/2021   Flu Shot  10/24/2022   Cologuard (Stool DNA test)  05/22/2023   Medicare Annual Wellness Visit  08/13/2023   DTaP/Tdap/Td vaccine (2 - Td or Tdap) 12/13/2024   Hepatitis C Screening: USPSTF Recommendation to screen - Ages 1-79 yo.  Completed   HPV Vaccine  Aged Out   Colon Cancer Screening  Discontinued    Advanced directives: no  Conditions/risks identified: none  Next appointment: Follow up in one year for your annual wellness visit. 08/15/23 @ 9:45 am by phone  Preventive Care 65 Years and Older, Male  Preventive care refers to lifestyle choices and visits with your health care provider that can promote health and wellness. What does preventive care include? A yearly physical exam. This is also called an annual well check. Dental exams once or twice a year. Routine eye exams. Ask your health care provider how often you should have your eyes checked. Personal lifestyle choices, including: Daily care of your teeth and gums. Regular physical activity. Eating a healthy diet. Avoiding tobacco and drug use. Limiting alcohol use. Practicing safe sex. Taking low doses of aspirin every day. Taking vitamin and mineral supplements as recommended by your health care provider. What happens during an annual well check? The  services and screenings done by your health care provider during your annual well check will depend on your age, overall health, lifestyle risk factors, and family history of disease. Counseling  Your health care provider may ask you questions about your: Alcohol use. Tobacco use. Drug use. Emotional well-being. Home and relationship well-being. Sexual activity. Eating habits. History of falls. Memory and ability to understand (cognition). Work and work Astronomer. Screening  You may have the following tests or measurements: Height, weight, and BMI. Blood pressure. Lipid and cholesterol levels. These may be checked every 5 years, or more frequently if you are over 62 years old. Skin check. Lung cancer screening. You may have this screening every year starting at age 39 if you have a 30-pack-year history of smoking and currently smoke or have quit within the past 15 years. Fecal occult blood test (FOBT) of the stool. You may have this test every year starting at age 44. Flexible sigmoidoscopy or colonoscopy. You may have a sigmoidoscopy every 5 years or a colonoscopy every 10 years starting at age 79. Prostate cancer screening. Recommendations will vary depending on your family history and other risks. Hepatitis C blood test. Hepatitis B blood test. Sexually transmitted disease (STD) testing. Diabetes screening. This is done by checking your blood sugar (glucose) after you have not eaten for a while (fasting). You may have this done every 1-3 years. Abdominal aortic aneurysm (AAA) screening.  You may need this if you are a current or former smoker. Osteoporosis. You may be screened starting at age 73 if you are at high risk. Talk with your health care provider about your test results, treatment options, and if necessary, the need for more tests. Vaccines  Your health care provider may recommend certain vaccines, such as: Influenza vaccine. This is recommended every year. Tetanus,  diphtheria, and acellular pertussis (Tdap, Td) vaccine. You may need a Td booster every 10 years. Zoster vaccine. You may need this after age 46. Pneumococcal 13-valent conjugate (PCV13) vaccine. One dose is recommended after age 32. Pneumococcal polysaccharide (PPSV23) vaccine. One dose is recommended after age 45. Talk to your health care provider about which screenings and vaccines you need and how often you need them. This information is not intended to replace advice given to you by your health care provider. Make sure you discuss any questions you have with your health care provider. Document Released: 04/07/2015 Document Revised: 11/29/2015 Document Reviewed: 01/10/2015 Elsevier Interactive Patient Education  2017 [employer].  Fall Prevention in the Home Falls can cause injuries. They can happen to people of all ages. There are many things you can do to make your home safe and to help prevent falls. What can I do on the outside of my home? Regularly fix the edges of walkways and driveways and fix any cracks. Remove anything that might make you trip as you walk through a door, such as a raised step or threshold. Trim any bushes or trees on the path to your home. Use bright outdoor lighting. Clear any walking paths of anything that might make someone trip, such as rocks or tools. Regularly check to see if handrails are loose or broken. Make sure that both sides of any steps have handrails. Any raised decks and porches should have guardrails on the edges. Have any leaves, snow, or ice cleared regularly. Use sand or salt on walking paths during winter. Clean up any spills in your garage right away. This includes oil or grease spills. What can I do in the bathroom? Use night lights. Install grab bars by the toilet and in the tub and shower. Do not use towel bars as grab bars. Use non-skid mats or decals in the tub or shower. If you need to sit down in the shower, use a plastic,  non-slip stool. Keep the floor dry. Clean up any water that spills on the floor as soon as it happens. Remove soap buildup in the tub or shower regularly. Attach bath mats securely with double-sided non-slip rug tape. Do not have throw rugs and other things on the floor that can make you trip. What can I do in the bedroom? Use night lights. Make sure that you have a light by your bed that is easy to reach. Do not use any sheets or blankets that are too big for your bed. They should not hang down onto the floor. Have a firm chair that has side arms. You can use this for support while you get dressed. Do not have throw rugs and other things on the floor that can make you trip. What can I do in the kitchen? Clean up any spills right away. Avoid walking on wet floors. Keep items that you use a lot in easy-to-reach places. If you need to reach something above you, use a strong step stool that has a grab bar. Keep electrical cords out of the way. Do not use floor polish or wax  that makes floors slippery. If you must use wax, use non-skid floor wax. Do not have throw rugs and other things on the floor that can make you trip. What can I do with my stairs? Do not leave any items on the stairs. Make sure that there are handrails on both sides of the stairs and use them. Fix handrails that are broken or loose. Make sure that handrails are as long as the stairways. Check any carpeting to make sure that it is firmly attached to the stairs. Fix any carpet that is loose or worn. Avoid having throw rugs at the top or bottom of the stairs. If you do have throw rugs, attach them to the floor with carpet tape. Make sure that you have a light switch at the top of the stairs and the bottom of the stairs. If you do not have them, ask someone to add them for you. What else can I do to help prevent falls? Wear shoes that: Do not have high heels. Have rubber bottoms. Are comfortable and fit you well. Are closed  at the toe. Do not wear sandals. If you use a stepladder: Make sure that it is fully opened. Do not climb a closed stepladder. Make sure that both sides of the stepladder are locked into place. Ask someone to hold it for you, if possible. Clearly mark and make sure that you can see: Any grab bars or handrails. First and last steps. Where the edge of each step is. Use tools that help you move around (mobility aids) if they are needed. These include: Canes. Walkers. Scooters. Crutches. Turn on the lights when you go into a dark area. Replace any light bulbs as soon as they burn out. Set up your furniture so you have a clear path. Avoid moving your furniture around. If any of your floors are uneven, fix them. If there are any pets around you, be aware of where they are. Review your medicines with your doctor. Some medicines can make you feel dizzy. This can increase your chance of falling. Ask your doctor what other things that you can do to help prevent falls. This information is not intended to replace advice given to you by your health care provider. Make sure you discuss any questions you have with your health care provider. Document Released: 01/05/2009 Document Revised: 08/17/2015 Document Reviewed: 04/15/2014 Elsevier Interactive Patient Education  2017 [employer].

## 2022-08-13 NOTE — Progress Notes (Cosign Needed)
I connected with  Frederick Pace. on 08/13/22 by a audio enabled telemedicine application and verified that I am speaking with the correct person using two identifiers.  Patient Location: Home  Provider Location: Home Office  I discussed the limitations of evaluation and management by telemedicine. The patient expressed understanding and agreed to proceed.  Subjective:   Frederick Pace. is a 72 y.o. male who presents for Medicare Annual/Subsequent preventive examination.  Review of Systems     Cardiac Risk Factors include: advanced age (>53men, >59 women);male gender     Objective:    Today's Vitals   08/13/22 1457  PainSc: 5    There is no height or weight on file to calculate BMI.     08/13/2022    3:03 PM 07/16/2021   12:48 PM 06/13/2020   11:27 AM 06/11/2019   11:16 AM 06/05/2018   12:25 PM  Advanced Directives  Does Patient Have a Medical Advance Directive? No No No No No  Would patient like information on creating a medical advance directive? No - Patient declined No - Patient declined No - Patient declined No - Patient declined No - Patient declined    Current Medications (verified) Outpatient Encounter Medications as of 08/13/2022  Medication Sig   amLODipine (NORVASC) 10 MG tablet TAKE 1 TABLET BY MOUTH DAILY   losartan (COZAAR) 100 MG tablet TAKE 1 TABLET BY MOUTH DAILY   metoprolol succinate (TOPROL-XL) 25 MG 24 hr tablet TAKE ONE TABLET BY MOUTH EVERY DAY   Red Yeast Rice Extract 600 MG CAPS Take 1 capsule by mouth 2 (two) times daily.    doxycycline (VIBRA-TABS) 100 MG tablet Take 1 tablet (100 mg total) by mouth 2 (two) times daily. (Patient not taking: Reported on 07/09/2022)   No facility-administered encounter medications on file as of 08/13/2022.    Allergies (verified) Patient has no known allergies.   History: Past Medical History:  Diagnosis Date   Aortic regurgitation    HTN (hypertension)    Mitral regurgitation    Other and  unspecified hyperlipidemia    Past Surgical History:  Procedure Laterality Date   ANTERIOR CRUCIATE LIGAMENT REPAIR Left 2004   RENAL ANGIOGRAPHY Right 01/08/2018   Procedure: RENAL ANGIOGRAPHY;  Surgeon: Annice Needy, MD;  Location: [facility] INVASIVE CV [facility];  Service: Cardiovascular;  Laterality: Right;   Family History  Problem Relation Age of Onset   Dementia Mother    Heart disease Father 59       CABG   Mitral valve prolapse Father    Aortic aneurysm Father    Heart disease Paternal Uncle        Valve replacements   Hyperlipidemia Other        family Hx   Social History   Socioeconomic History   Marital status: Single    Spouse name: Not on file   Number of children: Not on file   Years of education: Not on file   Highest education level: Not on file  Occupational History   Not on file  Tobacco Use   Smoking status: Never   Smokeless tobacco: Never  Vaping Use   Vaping Use: Never used  Substance and Sexual Activity   Alcohol use: Yes    Alcohol/week: 0.0 standard drinks of alcohol    Comment: 1 drink every few months   Drug use: No   Sexual activity: Not on file  Other Topics Concern   Not on file  Social History  Narrative   Not on file   Social Determinants of Health   Financial Resource Strain: Low Risk  (08/13/2022)   Overall Financial Resource Strain (CARDIA)    Difficulty of Paying Living Expenses: Not hard at all  Food Insecurity: No Food Insecurity (08/13/2022)   Hunger Vital Sign    Worried About Running Out of Food in the Last Year: Never true    Ran Out of Food in the Last Year: Never true  Transportation Needs: No Transportation Needs (08/13/2022)   PRAPARE - Administrator, Civil Service (Medical): No    Lack of Transportation (Non-Medical): No  Physical Activity: Insufficiently Active (08/13/2022)   Exercise Vital Sign    Days of Exercise per Week: 2 days    Minutes of Exercise per Session: 20 min  Stress: No Stress Concern Present  (08/13/2022)   [employer] of Occupational Health - Occupational Stress Questionnaire    Feeling of Stress : Only a little  Social Connections: Socially Isolated (08/13/2022)   Social Connection and Isolation Panel [NHANES]    Frequency of Communication with Friends and Family: More than three times a week    Frequency of Social Gatherings with Friends and Family: Twice a week    Attends Religious Services: Never    Database administrator or Organizations: No    Attends Engineer, structural: Never    Marital Status: Divorced    Tobacco Counseling Counseling given: Not Answered   Clinical Intake:  Pre-visit preparation completed: Yes  Pain : 0-10 Pain Score: 5  Pain Location: Hip Pain Orientation: Right Pain Radiating Towards: sciatica     Nutritional Risks: None Diabetes: No  How often do you need to have someone help you when you read instructions, pamphlets, or other written materials from your doctor or pharmacy?: 1 - Never  Diabetic?no  Interpreter Needed?: No  Information entered by :: Kennedy Bucker, LPN   Activities of Daily Living    08/13/2022    3:04 PM  In your present state of health, do you have any difficulty performing the following activities:  Hearing? 0  Vision? 0  Difficulty concentrating or making decisions? 0  Walking or climbing stairs? 0  Dressing or bathing? 0  Doing errands, shopping? 0  Preparing Food and eating ? N  Using the Toilet? N  In the past six months, have you accidently leaked urine? N  Do you have problems with loss of bowel control? N  Managing your Medications? N  Managing your Finances? N  Housekeeping or managing your Housekeeping? N    Patient Care Team: Sherlene Shams, MD as PCP - General (Internal Medicine)  Indicate any recent Medical Services you may have received from other than [facility] providers in the past year (date may be approximate).     Assessment:   This is a routine wellness  examination for [facility].  Hearing/Vision screen Hearing Screening - Comments:: No aids Vision Screening - Comments:: Wears readers-   Dietary issues and exercise activities discussed: Current Exercise Habits: Home exercise routine, Type of exercise: walking, Time (Minutes): 20, Frequency (Times/Week): 2, Weekly Exercise (Minutes/Week): 40   Goals Addressed             This Visit's Progress    DIET - EAT MORE FRUITS AND VEGETABLES         Depression Screen    08/13/2022    3:01 PM 07/17/2021    1:22 PM 07/16/2021   12:40 PM  06/13/2020   11:26 AM 06/09/2020   10:41 AM 06/11/2019   11:12 AM 06/05/2018   12:26 PM  PHQ 2/9 Scores  PHQ - 2 Score 0 0 0 0 0 0 0  PHQ- 9 Score 0   0 1      Fall Risk    08/13/2022    3:03 PM 07/17/2021    1:22 PM 07/16/2021   12:40 PM 06/13/2020   11:28 AM 06/09/2020   10:36 AM  Fall Risk   Falls in the past year? 1 0 0 0 0  Number falls in past yr: 0  0 0   Injury with Fall? 0   0   Risk for fall due to : History of fall(s) No Fall Risks     Follow up Falls prevention discussed;Falls evaluation completed Falls evaluation completed Falls evaluation completed Falls evaluation completed Falls evaluation completed    FALL RISK PREVENTION PERTAINING TO THE HOME:  Any stairs in or around the home? Yes  If so, are there any without handrails? No  Home free of loose throw rugs in walkways, pet beds, electrical cords, etc? Yes  Adequate lighting in your home to reduce risk of falls? Yes   ASSISTIVE DEVICES UTILIZED TO PREVENT FALLS:  Life alert? No  Use of a cane, walker or w/c? No  Grab bars in the bathroom? No  Shower chair or bench in shower? No  Elevated toilet seat or a handicapped toilet? No    Cognitive Function:    06/11/2019   11:19 AM  MMSE - Mini Mental State Exam  Not completed: Unable to complete        08/13/2022    3:07 PM 07/16/2021   12:49 PM 06/13/2020   11:28 AM 06/11/2019   11:20 AM 06/05/2018   12:28 PM  6CIT Screen   What Year? 0 points 0 points 0 points 0 points 0 points  What month? 0 points 0 points 0 points 0 points 0 points  What time? 0 points 0 points 0 points 0 points 0 points  Count back from 20 0 points  0 points  0 points  Months in reverse 2 points  0 points  0 points  Repeat phrase 2 points  0 points  0 points  Total Score 4 points  0 points  0 points    Immunizations Immunization History  Administered Date(s) Administered   Moderna Sars-Covid-2 Vaccination 06/18/2019, 07/16/2019, 02/15/2020   Tdap 12/14/2014    TDAP status: Up to date  Flu Vaccine status: Declined, Education has been provided regarding the importance of this vaccine but patient still declined. Advised may receive this vaccine at local pharmacy or Health Dept. Aware to provide a copy of the vaccination record if obtained from local pharmacy or Health Dept. Verbalized acceptance and understanding.  Pneumococcal vaccine status: Declined,  Education has been provided regarding the importance of this vaccine but patient still declined. Advised may receive this vaccine at local pharmacy or Health Dept. Aware to provide a copy of the vaccination record if obtained from local pharmacy or Health Dept. Verbalized acceptance and understanding.   Covid-19 vaccine status: Completed vaccines  Qualifies for Shingles Vaccine? Yes   Zostavax completed No   Shingrix Completed?: No.    Education has been provided regarding the importance of this vaccine. Patient has been advised to call insurance company to determine out of pocket expense if they have not yet received this vaccine. Advised may also receive vaccine at local  pharmacy or Health Dept. Verbalized acceptance and understanding.  Screening Tests Health Maintenance  Topic Date Due   Zoster Vaccines- Shingrix (1 of 2) Never done   Pneumonia Vaccine 48+ Years old (1 of 1 - PCV) Never done   COVID-19 Vaccine (4 - 2023-24 season) 11/23/2021   INFLUENZA VACCINE  10/24/2022    Fecal DNA (Cologuard)  05/22/2023   Medicare Annual Wellness (AWV)  08/13/2023   DTaP/Tdap/Td (2 - Td or Tdap) 12/13/2024   Hepatitis C Screening  Completed   HPV VACCINES  Aged Out   COLONOSCOPY (Pts 45-15yrs Insurance coverage will need to be confirmed)  Discontinued    Health Maintenance  Health Maintenance Due  Topic Date Due   Zoster Vaccines- Shingrix (1 of 2) Never done   Pneumonia Vaccine 53+ Years old (1 of 1 - PCV) Never done   COVID-19 Vaccine (4 - 2023-24 season) 11/23/2021    Colorectal cancer screening: Type of screening: Cologuard. Completed 05/21/20. Repeat every 3 years  Lung Cancer Screening: (Low Dose CT Chest recommended if Age 31-80 years, 30 pack-year currently smoking OR have quit w/in 15years.) does not qualify.    Additional Screening:  Hepatitis C Screening: does qualify; Completed 05/29/15  Vision Screening: Recommended annual ophthalmology exams for early detection of glaucoma and other disorders of the eye. Is the patient up to date with their annual eye exam?  No  Who is the provider or what is the name of the office in which the patient attends annual eye exams? No one If pt is not established with a provider, would they like to be referred to a provider to establish care? No .   Dental Screening: Recommended annual dental exams for proper oral hygiene  Community Resource Referral / Chronic Care Management: CRR required this visit?  No   CCM required this visit?  No      Plan:     I have personally reviewed and noted the following in the patient's chart:   Medical and social history Use of alcohol, tobacco or illicit drugs  Current medications and supplements including opioid prescriptions. Patient is not currently taking opioid prescriptions. Functional ability and status Nutritional status Physical activity Advanced directives List of other physicians Hospitalizations, surgeries, and ER visits in previous 12 months Vitals Screenings  to include cognitive, depression, and falls Referrals and appointments  In addition, I have reviewed and discussed with patient certain preventive protocols, quality metrics, and best practice recommendations. A written personalized care plan for preventive services as well as general preventive health recommendations were provided to patient.     Hal Hope, LPN   [id number]/61/0960   Nurse Notes: none   I have reviewed the above information and agree with above.   Duncan Dull, MD

## 2022-08-19 ENCOUNTER — Other Ambulatory Visit: Payer: Self-pay | Admitting: Internal Medicine

## 2022-08-20 NOTE — Telephone Encounter (Signed)
Refilled: 1 year ago Last OV: 12/11/2021 Next OV: 09/10/2022

## 2022-09-10 ENCOUNTER — Encounter: Payer: Self-pay | Admitting: Internal Medicine

## 2022-09-10 ENCOUNTER — Ambulatory Visit (INDEPENDENT_AMBULATORY_CARE_PROVIDER_SITE_OTHER): Payer: Medicare Other | Admitting: Internal Medicine

## 2022-09-10 VITALS — BP 132/68 | HR 65 | Temp 99.1°F | Ht 64.0 in | Wt 179.8 lb

## 2022-09-10 DIAGNOSIS — E782 Mixed hyperlipidemia: Secondary | ICD-10-CM | POA: Diagnosis not present

## 2022-09-10 DIAGNOSIS — Z125 Encounter for screening for malignant neoplasm of prostate: Secondary | ICD-10-CM

## 2022-09-10 DIAGNOSIS — Z0000 Encounter for general adult medical examination without abnormal findings: Secondary | ICD-10-CM | POA: Diagnosis not present

## 2022-09-10 DIAGNOSIS — I10 Essential (primary) hypertension: Secondary | ICD-10-CM

## 2022-09-10 DIAGNOSIS — E669 Obesity, unspecified: Secondary | ICD-10-CM

## 2022-09-10 MED ORDER — METOPROLOL SUCCINATE ER 25 MG PO TB24: 25.0000 mg | ORAL_TABLET | Freq: Every day | ORAL | 1 refills | Status: DC

## 2022-09-10 NOTE — Assessment & Plan Note (Signed)
He continues to decline statin therapy but is willing to consider statins if his cardiac calcium score is high.  CT WAS ORDERED IN OCTOBER.  REORDERING TODAY

## 2022-09-10 NOTE — Assessment & Plan Note (Signed)

## 2022-09-10 NOTE — Progress Notes (Signed)
Patient ID: Frederick Pace., male    DOB: [birth year]  Age: 72 y.o. MRN: [id number]  The patient is here for annual preventive examination and management of other chronic and acute problems.   The risk factors are reflected in the social history.   The roster of all physicians providing medical care to patient - is listed in the Snapshot section of the chart.   Activities of daily living:  The patient is 100% independent in all ADLs: dressing, toileting, feeding as well as independent mobility   Home safety : The patient has smoke detectors in the home. They wear seatbelts.  There are no unsecured firearms at home. There is no violence in the home.    There is no risks for hepatitis, STDs or HIV. There is no   history of blood transfusion. They have no travel history to infectious disease endemic areas of the world.   The patient has seen their dentist in the last six month. They have seen their eye doctor in the last year. The patinet  denies slight hearing difficulty with regard to whispered voices and some television programs.  They have deferred audiologic testing in the last year.  They do not  have excessive sun exposure. Discussed the need for sun protection: hats, long sleeves and use of sunscreen if there is significant sun exposure.    Diet: the importance of a healthy diet is discussed. They do have a healthy diet.   The benefits of regular aerobic exercise were discussed. The patient  exercises  3 to 5 days per week  for  60 minutes.    Depression screen: there are no signs or vegative symptoms of depression- irritability, change in appetite, anhedonia, sadness/tearfullness.   The following portions of the patient's history were reviewed and updated as appropriate: allergies, current medications, past family history, past medical history,  past surgical history, past social history  and problem list.   Visual acuity was not assessed per patient preference since the patient has  regular follow up with an  ophthalmologist. Hearing and body mass index were assessed and reviewed.    During the course of the visit the patient was educated and counseled about appropriate screening and preventive services including : fall prevention , diabetes screening, nutrition counseling, colorectal cancer screening, and recommended immunizations.    Chief Complaint:  1) HTN:  Patient is taking his medications as prescribed and notes no adverse effects.  Home BP readings have not been done,  .  Office readings have been generally < 130/80 .  he is avoiding added salt in his diet and walking regularly about 3 times per week for exercise  .  Taking amlodipine,  losartan and metoprolol.     2) Grief at loss of mother [name].    Resolved,  Life is less complicated   3)  no recent eye exam.  Wears readers  4) nocturia x 3-4 ,  weaker stream than 10 yrs ago.   5) he continues to decline all immunizations   6) HLD:  ate at occasions  one hour ago  ate chicken   Review of Symptoms  Patient denies headache, fevers, malaise, unintentional weight loss, skin rash, eye pain, sinus congestion and sinus pain, sore throat, dysphagia,  hemoptysis , cough, dyspnea, wheezing, chest pain, palpitations, orthopnea, edema, abdominal pain, nausea, melena, diarrhea, constipation, flank pain, dysuria, hematuria, urinary  Frequency, nocturia, numbness, tingling, seizures,  Focal weakness, Loss of consciousness,  Tremor, insomnia, depression, anxiety,  and suicidal ideation.    Physical Exam:  BP 132/68   Pulse 65   Temp 99.1 F (37.3 C) (Oral)   Ht 5\' 4"  (1.626 m)   Wt 179 lb 12.8 oz (81.6 kg)   SpO2 96%   BMI 30.86 kg/m    Physical Exam Vitals reviewed.  Constitutional:      General: He is not in acute distress.    Appearance: Normal appearance. He is normal weight. He is not ill-appearing, toxic-appearing or diaphoretic.  HENT:     Head: Normocephalic and atraumatic.     Right Ear: Tympanic  membrane, ear canal and external ear normal. There is no impacted cerumen.     Left Ear: Tympanic membrane, ear canal and external ear normal. There is no impacted cerumen.     Nose: Nose normal.     Mouth/Throat:     Mouth: Mucous membranes are moist.     Pharynx: Oropharynx is clear.  Eyes:     General: No scleral icterus.       Right eye: No discharge.        Left eye: No discharge.     Conjunctiva/sclera: Conjunctivae normal.  Neck:     Thyroid: No thyromegaly.     Vascular: No carotid bruit or JVD.  Cardiovascular:     Rate and Rhythm: Normal rate and regular rhythm.     Heart sounds: Normal heart sounds.  Pulmonary:     Effort: Pulmonary effort is normal. No respiratory distress.     Breath sounds: Normal breath sounds.  Abdominal:     General: Bowel sounds are normal.     Palpations: Abdomen is soft. There is no mass.     Tenderness: There is no abdominal tenderness. There is no guarding or rebound.  Musculoskeletal:        General: Normal range of motion.     Cervical back: Normal range of motion and neck supple.  Lymphadenopathy:     Cervical: No cervical adenopathy.  Skin:    General: Skin is warm and dry.  Neurological:     General: No focal deficit present.     Mental Status: He is alert and oriented to person, place, and time. Mental status is at baseline.  Psychiatric:        Mood and Affect: Mood normal.        Behavior: Behavior normal.        Thought Content: Thought content normal.        Judgment: Judgment normal.    Assessment and Plan: Essential hypertension -     CT CARDIAC SCORING (SELF PAY ONLY); Future -     Comprehensive metabolic panel; Future -     Microalbumin / creatinine urine ratio; Future  Prostate cancer screening -     PSA, Medicare; Future  Mixed hyperlipidemia Assessment & Plan: He continues to decline statin therapy but is willing to consider statins if his cardiac calcium score is high.  CT WAS ORDERED IN OCTOBER.  REORDERING  TODAY   Orders: -     CT CARDIAC SCORING (SELF PAY ONLY); Future -     LDL cholesterol, direct; Future -     Lipid panel; Future  Obesity (BMI 30-39.9) -     CBC with Differential/Platelet; Future -     TSH; Future  Visit for preventive health examination Assessment & Plan: age appropriate education and counseling updated, referrals for preventative services and immunizations addressed, dietary and smoking counseling addressed, most recent  labs reviewed.  I have personally reviewed and have noted:   1) the patient's medical and social history 2) The pt's use of alcohol, tobacco, and illicit drugs 3) The patient's current medications and supplements 4) Functional ability including ADL's, fall risk, home safety risk, hearing and visual impairment 5) Diet and physical activities 6) Evidence for depression or mood disorder 7) The patient's height, weight, and BMI have been recorded in the chart    I have made referrals, and provided counseling and education based on review of the above    Other orders -     Metoprolol Succinate ER; Take 1 tablet (25 mg total) by mouth daily.  Dispense: 90 tablet; Refill: 1    Return in about 6 months (around 03/12/2023).  Sherlene Shams, MD

## 2022-09-10 NOTE — Patient Instructions (Signed)
I have reordered the coronary calcium score that you and Dr End discussed.   I will prescribe something for your prostate once I see your PSA score  Return for fasting labs (4 hours of fasting is fine)

## 2022-09-12 ENCOUNTER — Other Ambulatory Visit (INDEPENDENT_AMBULATORY_CARE_PROVIDER_SITE_OTHER): Payer: Medicare Other

## 2022-09-12 DIAGNOSIS — E782 Mixed hyperlipidemia: Secondary | ICD-10-CM

## 2022-09-12 DIAGNOSIS — E669 Obesity, unspecified: Secondary | ICD-10-CM

## 2022-09-12 DIAGNOSIS — I10 Essential (primary) hypertension: Secondary | ICD-10-CM

## 2022-09-12 DIAGNOSIS — Z125 Encounter for screening for malignant neoplasm of prostate: Secondary | ICD-10-CM

## 2022-09-12 LAB — COMPREHENSIVE METABOLIC PANEL
ALT: 23 U/L (ref 0–53)
AST: 16 U/L (ref 0–37)
Albumin: 4.4 g/dL (ref 3.5–5.2)
Alkaline Phosphatase: 65 U/L (ref 39–117)
BUN: 26 mg/dL — ABNORMAL HIGH (ref 6–23)
CO2: 25 mEq/L (ref 19–32)
Calcium: 9.2 mg/dL (ref 8.4–10.5)
Chloride: 102 mEq/L (ref 96–112)
Creatinine, Ser: 1.14 mg/dL (ref 0.40–1.50)
GFR: 65.23 mL/min (ref >60.00)
Glucose, Bld: 98 mg/dL (ref 70–99)
Potassium: 4.2 mEq/L (ref 3.5–5.1)
Sodium: 136 mEq/L (ref 135–145)
Total Bilirubin: 0.7 mg/dL (ref 0.2–1.2)
Total Protein: 7.5 g/dL (ref 6.0–8.3)

## 2022-09-12 LAB — CBC WITH DIFFERENTIAL/PLATELET
Basophils Absolute: 0.1 10*3/uL (ref 0.0–0.1)
Basophils Relative: 0.7 % (ref 0.0–3.0)
Eosinophils Absolute: 0.2 10*3/uL (ref 0.0–0.7)
Eosinophils Relative: 2.1 % (ref 0.0–5.0)
HCT: 43.9 % (ref 39.0–52.0)
Hemoglobin: 14.6 g/dL (ref 13.0–17.0)
Lymphocytes Relative: 33.9 % (ref 12.0–46.0)
Lymphs Abs: 2.8 10*3/uL (ref 0.7–4.0)
MCHC: 33.2 g/dL (ref 30.0–36.0)
MCV: 90.4 fl (ref 78.0–100.0)
Monocytes Absolute: 0.7 10*3/uL (ref 0.1–1.0)
Monocytes Relative: 8.2 % (ref 3.0–12.0)
Neutro Abs: 4.5 10*3/uL (ref 1.4–7.7)
Neutrophils Relative %: 55.1 % (ref 43.0–77.0)
Platelets: 248 10*3/uL (ref 150.0–400.0)
RBC: 4.86 Mil/uL (ref 4.22–5.81)
RDW: 14.2 % (ref 11.5–15.5)
WBC: 8.2 10*3/uL (ref 4.0–10.5)

## 2022-09-12 LAB — LIPID PANEL
Cholesterol: 181 mg/dL (ref 0–200)
HDL: 36.6 mg/dL — ABNORMAL LOW (ref >39.00)
LDL Cholesterol: 116 mg/dL — ABNORMAL HIGH (ref 0–99)
NonHDL: 144.55
Total CHOL/HDL Ratio: 5
Triglycerides: 144 mg/dL (ref 0.0–149.0)
VLDL: 28.8 mg/dL (ref 0.0–40.0)

## 2022-09-12 LAB — MICROALBUMIN / CREATININE URINE RATIO
Creatinine,U: 120.4 mg/dL
Microalb Creat Ratio: 2.1 mg/g (ref 0.0–30.0)
Microalb, Ur: 2.5 mg/dL — ABNORMAL HIGH (ref 0.0–1.9)

## 2022-09-12 LAB — LDL CHOLESTEROL, DIRECT: Direct LDL: 133 mg/dL

## 2022-09-12 LAB — TSH: TSH: 2.65 u[IU]/mL (ref 0.35–5.50)

## 2022-09-12 LAB — PSA, MEDICARE: PSA: 0.36 ng/ml (ref 0.10–4.00)

## 2022-10-17 ENCOUNTER — Inpatient Hospital Stay: Admission: RE | Admit: 2022-10-17 | Payer: Medicare Other | Source: Ambulatory Visit

## 2022-10-30 DIAGNOSIS — H6122 Impacted cerumen, left ear: Secondary | ICD-10-CM | POA: Diagnosis not present

## 2022-10-30 DIAGNOSIS — K219 Gastro-esophageal reflux disease without esophagitis: Secondary | ICD-10-CM | POA: Diagnosis not present

## 2022-11-20 ENCOUNTER — Other Ambulatory Visit: Payer: Medicare Other

## 2022-11-29 ENCOUNTER — Inpatient Hospital Stay: Admission: RE | Admit: 2022-11-29 | Payer: Medicare Other | Source: Ambulatory Visit

## 2022-12-05 ENCOUNTER — Ambulatory Visit
Admission: RE | Admit: 2022-12-05 | Discharge: 2022-12-05 | Disposition: A | Discharge status: Home / Self Care | Payer: Medicare Other | Source: Ambulatory Visit | Attending: Internal Medicine | Admitting: Internal Medicine

## 2022-12-05 DIAGNOSIS — E782 Mixed hyperlipidemia: Secondary | ICD-10-CM | POA: Insufficient documentation

## 2022-12-05 DIAGNOSIS — I10 Essential (primary) hypertension: Secondary | ICD-10-CM | POA: Insufficient documentation

## 2022-12-12 ENCOUNTER — Ambulatory Visit: Payer: Medicare Other | Admitting: Internal Medicine

## 2022-12-24 ENCOUNTER — Other Ambulatory Visit: Payer: Self-pay | Admitting: Internal Medicine

## 2023-02-06 ENCOUNTER — Ambulatory Visit: Payer: Medicare Other | Attending: Internal Medicine | Admitting: Internal Medicine

## 2023-02-06 ENCOUNTER — Encounter: Payer: Self-pay | Admitting: Internal Medicine

## 2023-02-06 VITALS — BP 136/78 | HR 68 | Ht 64.0 in | Wt 189.4 lb

## 2023-02-06 DIAGNOSIS — I25118 Atherosclerotic heart disease of native coronary artery with other forms of angina pectoris: Secondary | ICD-10-CM | POA: Diagnosis not present

## 2023-02-06 DIAGNOSIS — I4510 Unspecified right bundle-branch block: Secondary | ICD-10-CM | POA: Diagnosis not present

## 2023-02-06 DIAGNOSIS — R079 Chest pain, unspecified: Secondary | ICD-10-CM | POA: Diagnosis not present

## 2023-02-06 DIAGNOSIS — I10 Essential (primary) hypertension: Secondary | ICD-10-CM

## 2023-02-06 DIAGNOSIS — E7800 Pure hypercholesterolemia, unspecified: Secondary | ICD-10-CM

## 2023-02-06 DIAGNOSIS — I200 Unstable angina: Secondary | ICD-10-CM | POA: Insufficient documentation

## 2023-02-06 DIAGNOSIS — I2510 Atherosclerotic heart disease of native coronary artery without angina pectoris: Secondary | ICD-10-CM

## 2023-02-06 MED ORDER — NITROGLYCERIN 0.4 MG SL SUBL: 0.4000 mg | SUBLINGUAL_TABLET | SUBLINGUAL | 3 refills | Status: DC | PRN

## 2023-02-06 MED ORDER — ASPIRIN 81 MG PO TBEC: 81.0000 mg | DELAYED_RELEASE_TABLET | Freq: Every day | ORAL | Status: DC

## 2023-02-06 MED ORDER — ROSUVASTATIN CALCIUM 20 MG PO TABS: 20.0000 mg | ORAL_TABLET | Freq: Every day | ORAL | 3 refills | Status: DC

## 2023-02-06 NOTE — H&P (View-Only) (Signed)
Cardiology Office Note:  .   Date:  02/06/2023  ID:  Mikki Harbor., DOB 22-Dec-1950, MRN [id number] PCP: Sherlene Shams, MD  [facility] Providers Cardiologist:  None     History of Present Illness: [name]   Frederick Blank Gokul Arrant. is a 72 y.o. male with history of coronary artery calcification (CAC 1066), hypertension, hyperlipidemia, and solitary kidney presenting for follow-up of CAD.  I last saw him a year ago, at which time he was feeling well with the exception of low-back pain.  He was reluctant to initiate statin therapy despite a long history of hyperlipidemia.  We agreed to obtain a coronary calcium score, which was ultimately completed in 11/2022 and returned very high.  He returns today to discuss the results and opportunities for modifying his cardiovascular risk.  Today, Frederick Pace reports that he has been experiencing intermittent tightness in his upper chest and throat with activity began about 2 to 3 months ago.  He has also been having some shortness of breath when walking relatively short distances like from his car to our office today.  He saw ENT because of the tightness in his throat and was placed on omeprazole without any improvement.  He has not had any symptoms at rest.  He denies palpitations, lightheadedness, and edema.  He reports having put on about 10 to 12 pounds in the last few months because of "eating too much."  He does not check his blood pressure regularly at home.  ROS: See HPI  Studies Reviewed: [name]   EKG Interpretation Date/Time:  Thursday February 06 2023 09:15:14 EST Ventricular Rate:  68 PR Interval:  214 QRS Duration:  156 QT Interval:  414 QTC Calculation: 440 R Axis:   110  Text Interpretation: Sinus rhythm with 1st degree A-V block Right axis deviation Right bundle branch block When compared with ECG of 02-Jan-2022 PR interval has increased ECG OTHERWISE WITHIN NORMAL LIMITS Confirmed by Armel Rabbani, Cristal Deer [phone]) on 02/06/2023 9:19:52 AM     Coronary artery calcium score (12/05/2022): CAC score 1066 (86th percentile).  No significant extracardiac findings.  TTE (02/21/2022): Normal LV size and wall thickness.  LVEF 60-65% with grade 1 diastolic dysfunction.  Normal RV size and function.  Normal PA pressure.  Normal biatrial size.  Mild MR and TR.  Aortic sclerosis with mild regurgitation.  Risk Assessment/Calculations:             Physical Exam:   VS:  BP 136/78   Pulse 68   Ht 5\' 4"  (1.626 m)   Wt 189 lb 6.4 oz (85.9 kg)   SpO2 98%   BMI 32.51 kg/m    Wt Readings from Last 3 Encounters:  02/06/23 189 lb 6.4 oz (85.9 kg)  09/10/22 179 lb 12.8 oz (81.6 kg)  08/13/22 185 lb (83.9 kg)    General:  NAD. Neck: No JVD or HJR. Lungs: Clear to auscultation bilaterally without wheezes or crackles. Heart: Regular rate and rhythm with 1/6 systolic murmur.  No rubs or gallops. Abdomen: Soft, nontender, nondistended. Extremities: No lower extremity edema.  2+ radial pulses bilaterally.  ASSESSMENT AND PLAN: .    Coronary artery disease with accelerating angina: Frederick Pace reports to 3 months of exertional tightness in the upper chest/lower throat as well as dyspnea concerning for angina.  We reviewed the results of his coronary calcium score in September which was very high.  I have recommended proceeding with cardiac catheterization and possible PCI, which Frederick Pace  is in agreement with.  Given his very high calcium score, catheterization will be scheduled at [facility] so that atherectomy and lithotripsy are available if needed.  In the meantime, I will have him discontinue red yeast rice and begin rosuvastatin 20 mg daily as well as aspirin 81 mg daily.  A prescription for sublingual nitroglycerin has also been provided.  I am reluctant to escalate his metoprolol further at this time given resting heart rate in the 60s and mild first-degree AV block on today's EKG.  I have advised him to seek immediate medical attention if he has  worsening chest pain.  Hypertension: Blood pressure borderline elevated today.  Continue current medications.  Further adjustments to be made following aforementioned catheterization.  Hyperlipidemia: Lipid panel in June was notable for calculated LDL of 116 and direct LDL of 133.  In the setting of his very high coronary calcium score and symptoms concerning for angina over the last 2 to 3 months, I have recommended addition of rosuvastatin 20 mg daily.  We will need to repeat a lipid panel and ALT in about 2 to 3 months.    Informed Consent   Shared Decision Making/Informed Consent The risks [stroke (1 in 1000), death (1 in 1000), kidney failure [usually temporary] (1 in 500), bleeding (1 in 200), allergic reaction [possibly serious] (1 in 200)], benefits (diagnostic support and management of coronary artery disease) and alternatives of a cardiac catheterization were discussed in detail with Frederick Pace and he is willing to proceed.     Dispo: Return to clinic approximately 2 weeks after catheterization.  Signed, Yvonne Kendall, MD

## 2023-02-06 NOTE — Patient Instructions (Addendum)
Medication Instructions:  Your physician recommends the following medication changes.  STOP TAKING: Red yest rice  START TAKING: Rosuvastatin 20 mg by mouth daily Asprin 81 mg by mouth daily (Can purchase over the counter) Nitroglycerin as needed  A prescription has been sent in for Nitroglycerin.  If you have chest pain that doesn't relieve quickly, place one tablet under your tongue and allow it to dissolve.  If no relief after 5 minutes, you may take another pill.  If no relief after 5 minutes, you may take a 3rd dose but you need to call 911 and report to ER immediately.  *If you need a refill on your cardiac medications before your next appointment, please call your pharmacy*   Lab Work: Your provider would like for you to have following labs drawn today (BMP, CBC).     Testing/Procedures: Your physician has requested that you have a cardiac catheterization. Cardiac catheterization is used to diagnose and/or treat various heart conditions. Doctors may recommend this procedure for a number of different reasons. The most common reason is to evaluate chest pain. Chest pain can be a symptom of coronary artery disease (CAD), and cardiac catheterization can show whether plaque is narrowing or blocking your heart's arteries. This procedure is also used to evaluate the valves, as well as measure the blood flow and oxygen levels in different parts of your heart. For further information please visit [email or web address] Please follow instruction sheet, as given.  Please see instructions below   Follow-Up: At [facility], you and your health needs are our priority.  As part of our continuing mission to provide you with exceptional heart care, we have created designated Provider Care Teams.  These Care Teams include your primary Cardiologist (physician) and Advanced Practice Providers (APPs -  Physician Assistants and Nurse Practitioners) who all work together to provide you with the care  you need, when you need it.  We recommend signing up for the patient portal called "MyChart".  Sign up information is provided on this After Visit Summary.  MyChart is used to connect with patients for Virtual Visits (Telemedicine).  Patients are able to view lab/test results, encounter notes, upcoming appointments, etc.  Non-urgent messages can be sent to your provider as well.   To learn more about what you can do with MyChart, go to [email or web address].    Your next appointment:   4 week(s)  Provider:   You may see Yvonne Kendall, MD or one of the following Advanced Practice Providers on your designated Care Team:   Nicolasa Ducking, NP Eula Listen, PA-C Cadence Fransico Michael, PA-C Charlsie Quest, NP Carlos Levering, NP     [facility] DEPT OF [place]. [facility] AT [facility] [address] [place] [address] Dept: [phone] Loc: [phone]  Jakhai Hepler.  02/06/2023  You are scheduled for a Cardiac Catheterization on Monday, November 25 with Dr. Cristal Deer End.  1. Please arrive at the [facility] (Main Entrance A) at [facility]: [address] [place], [place] [address] at 7:00 AM(This time is 2 hour(s) before your procedure to ensure your preparation). Free valet parking service is available. You will check in at ADMITTING. The support person will be asked to wait in the waiting room.  It is OK to have someone drop you off and come back when you are ready to be discharged.    Special note: Every effort is made to have your procedure  done on time. Please understand that emergencies sometimes delay scheduled procedures.  2. Diet: Do not eat solid foods after midnight.  The patient may have clear liquids until 5am upon the day of the procedure.  3. Labs: You will need to have blood drawn today (CBC, BMP)  4. Medication instructions in preparation for your procedure:   Contrast Allergy:  No   On the morning of your procedure, take your Aspirin 81 mg and any morning medicines NOT listed above.  You may use sips of water.  5. Plan to go home the same day, you will only stay overnight if medically necessary. 6. Bring a current list of your medications and current insurance cards. 7. You MUST have a responsible person to drive you home. 8. Someone MUST be with you the first 24 hours after you arrive home or your discharge will be delayed. 9. Please wear clothes that are easy to get on and off and wear slip-on shoes.  Thank you for allowing [place] to care for you!   -- [place] [facility]

## 2023-02-06 NOTE — Progress Notes (Addendum)
Cardiology Office Note:  .   Date:  02/06/2023  ID:  Frederick Harbor., DOB 1951-02-15, MRN [id number] PCP: Sherlene Shams, MD  [facility] Providers Cardiologist:  None     History of Present Illness: Frederick Kitchen   Frederick Blank Yeeleng Smet. is a 72 y.o. male with history of coronary artery calcification (CAC 1066), hypertension, hyperlipidemia, and solitary kidney presenting for follow-up of CAD.  I last saw him a year ago, at which time he was feeling well with the exception of low-back pain.  He was reluctant to initiate statin therapy despite a long history of hyperlipidemia.  We agreed to obtain a coronary calcium score, which was ultimately completed in 11/2022 and returned very high.  He returns today to discuss the results and opportunities for modifying his cardiovascular risk.  Today, Frederick Pace reports that he has been experiencing intermittent tightness in his upper chest and throat with activity began about 2 to 3 months ago.  He has also been having some shortness of breath when walking relatively short distances like from his car to our office today.  He saw ENT because of the tightness in his throat and was placed on omeprazole without any improvement.  He has not had any symptoms at rest.  He denies palpitations, lightheadedness, and edema.  He reports having put on about 10 to 12 pounds in the last few months because of "eating too much."  He does not check his blood pressure regularly at home.  ROS: See HPI  Studies Reviewed: Frederick Kitchen   EKG Interpretation Date/Time:  Thursday February 06 2023 09:15:14 EST Ventricular Rate:  68 PR Interval:  214 QRS Duration:  156 QT Interval:  414 QTC Calculation: 440 R Axis:   110  Text Interpretation: Sinus rhythm with 1st degree A-V block Right axis deviation Right bundle branch block When compared with ECG of 02-Jan-2022 PR interval has increased ECG OTHERWISE WITHIN NORMAL LIMITS Confirmed by Alisha Burgo, Cristal Deer [phone]) on 02/06/2023 9:19:52 AM     Coronary artery calcium score (12/05/2022): CAC score 1066 (86th percentile).  No significant extracardiac findings.  TTE (02/21/2022): Normal LV size and wall thickness.  LVEF 60-65% with grade 1 diastolic dysfunction.  Normal RV size and function.  Normal PA pressure.  Normal biatrial size.  Mild MR and TR.  Aortic sclerosis with mild regurgitation.  Risk Assessment/Calculations:             Physical Exam:   VS:  BP 136/78   Pulse 68   Ht 5\' 4"  (1.626 m)   Wt 189 lb 6.4 oz (85.9 kg)   SpO2 98%   BMI 32.51 kg/m    Wt Readings from Last 3 Encounters:  02/06/23 189 lb 6.4 oz (85.9 kg)  09/10/22 179 lb 12.8 oz (81.6 kg)  08/13/22 185 lb (83.9 kg)    General:  NAD. Neck: No JVD or HJR. Lungs: Clear to auscultation bilaterally without wheezes or crackles. Heart: Regular rate and rhythm with 1/6 systolic murmur.  No rubs or gallops. Abdomen: Soft, nontender, nondistended. Extremities: No lower extremity edema.  2+ radial pulses bilaterally.  ASSESSMENT AND PLAN: .    Coronary artery disease with accelerating angina: Frederick Pace reports to 3 months of exertional tightness in the upper chest/lower throat as well as dyspnea concerning for angina.  We reviewed the results of his coronary calcium score in September which was very high.  I have recommended proceeding with cardiac catheterization and possible PCI, which Frederick Pace  is in agreement with.  Given his very high calcium score, catheterization will be scheduled at [facility] so that atherectomy and lithotripsy are available if needed.  In the meantime, I will have him discontinue red yeast rice and begin rosuvastatin 20 mg daily as well as aspirin 81 mg daily.  A prescription for sublingual nitroglycerin has also been provided.  I am reluctant to escalate his metoprolol further at this time given resting heart rate in the 60s and mild first-degree AV block on today's EKG.  I have advised him to seek immediate medical attention if he has  worsening chest pain.  Hypertension: Blood pressure borderline elevated today.  Continue current medications.  Further adjustments to be made following aforementioned catheterization.  Hyperlipidemia: Lipid panel in June was notable for calculated LDL of 116 and direct LDL of 133.  In the setting of his very high coronary calcium score and symptoms concerning for angina over the last 2 to 3 months, I have recommended addition of rosuvastatin 20 mg daily.  We will need to repeat a lipid panel and ALT in about 2 to 3 months.    Informed Consent   Shared Decision Making/Informed Consent The risks [stroke (1 in 1000), death (1 in 1000), kidney failure [usually temporary] (1 in 500), bleeding (1 in 200), allergic reaction [possibly serious] (1 in 200)], benefits (diagnostic support and management of coronary artery disease) and alternatives of a cardiac catheterization were discussed in detail with Frederick Pace and he is willing to proceed.     Dispo: Return to clinic approximately 2 weeks after catheterization.  Signed, Yvonne Kendall, MD

## 2023-02-07 LAB — BASIC METABOLIC PANEL
BUN/Creatinine Ratio: 13 (ref 10–24)
BUN: 15 mg/dL (ref 8–27)
CO2: 20 mmol/L (ref 20–29)
Calcium: 9.3 mg/dL (ref 8.6–10.2)
Chloride: 99 mmol/L (ref 96–106)
Creatinine, Ser: 1.12 mg/dL (ref 0.76–1.27)
Glucose: 90 mg/dL (ref 70–99)
Potassium: 4.5 mmol/L (ref 3.5–5.2)
Sodium: 138 mmol/L (ref 134–144)
eGFR: 71 mL/min/{1.73_m2} (ref >59)

## 2023-02-07 LAB — CBC
Hematocrit: 47.3 % (ref 37.5–51.0)
Hemoglobin: 15.7 g/dL (ref 13.0–17.7)
MCH: 30.5 pg (ref 26.6–33.0)
MCHC: 33.2 g/dL (ref 31.5–35.7)
MCV: 92 fL (ref 79–97)
Platelets: 234 10*3/uL (ref 150–450)
RBC: 5.14 x10E6/uL (ref 4.14–5.80)
RDW: 12.2 % (ref 11.6–15.4)
WBC: 8.6 10*3/uL (ref 3.4–10.8)

## 2023-02-13 ENCOUNTER — Telehealth: Payer: Self-pay | Admitting: *Deleted

## 2023-02-13 NOTE — Telephone Encounter (Signed)
Cardiac Catheterization scheduled at [facility] for: Monday February 17, 2023 9 AM Arrival time [facility] Main Entrance A at: 7 AM  Nothing to eat after midnight prior to procedure, clear liquids until 5 AM day of procedure.  Medication instructions: -Usual morning medications can be taken with sips of water including aspirin 81 mg.  Plan to go home the same day, you will only stay overnight if medically necessary.  You must have responsible adult to drive you home.  Someone must be with you the first 24 hours after you arrive home.  Reviewed procedure instructions with patient.

## 2023-02-17 ENCOUNTER — Encounter (HOSPITAL_COMMUNITY): Payer: Self-pay | Admitting: Internal Medicine

## 2023-02-17 ENCOUNTER — Other Ambulatory Visit: Payer: Self-pay

## 2023-02-17 ENCOUNTER — Observation Stay (HOSPITAL_COMMUNITY)
Admission: RE | Admit: 2023-02-17 | Discharge: 2023-02-18 | Disposition: A | Discharge status: Home / Self Care | Payer: Medicare Other | Attending: Internal Medicine | Admitting: Internal Medicine

## 2023-02-17 ENCOUNTER — Inpatient Hospital Stay (HOSPITAL_COMMUNITY): Payer: Medicare Other

## 2023-02-17 ENCOUNTER — Encounter (HOSPITAL_COMMUNITY)
Admission: RE | Disposition: A | Discharge status: Home / Self Care | Payer: Self-pay | Source: Home / Self Care | Attending: Internal Medicine

## 2023-02-17 DIAGNOSIS — I25110 Atherosclerotic heart disease of native coronary artery with unstable angina pectoris: Secondary | ICD-10-CM

## 2023-02-17 DIAGNOSIS — I25118 Atherosclerotic heart disease of native coronary artery with other forms of angina pectoris: Principal | ICD-10-CM | POA: Insufficient documentation

## 2023-02-17 DIAGNOSIS — Z7982 Long term (current) use of aspirin: Secondary | ICD-10-CM | POA: Diagnosis not present

## 2023-02-17 DIAGNOSIS — Z79899 Other long term (current) drug therapy: Secondary | ICD-10-CM | POA: Diagnosis not present

## 2023-02-17 DIAGNOSIS — I2510 Atherosclerotic heart disease of native coronary artery without angina pectoris: Secondary | ICD-10-CM | POA: Diagnosis present

## 2023-02-17 DIAGNOSIS — E785 Hyperlipidemia, unspecified: Secondary | ICD-10-CM | POA: Diagnosis present

## 2023-02-17 DIAGNOSIS — E782 Mixed hyperlipidemia: Secondary | ICD-10-CM | POA: Diagnosis present

## 2023-02-17 DIAGNOSIS — I10 Essential (primary) hypertension: Secondary | ICD-10-CM | POA: Diagnosis not present

## 2023-02-17 DIAGNOSIS — I200 Unstable angina: Principal | ICD-10-CM | POA: Diagnosis present

## 2023-02-17 HISTORY — PX: LEFT HEART CATH AND CORONARY ANGIOGRAPHY: CATH118249

## 2023-02-17 LAB — LIPID PANEL
Cholesterol: 136 mg/dL (ref 0–200)
HDL: 31 mg/dL — ABNORMAL LOW (ref >40)
LDL Cholesterol: 81 mg/dL (ref 0–99)
Total CHOL/HDL Ratio: 4.4 {ratio}
Triglycerides: 121 mg/dL (ref <150)
VLDL: 24 mg/dL (ref 0–40)

## 2023-02-17 LAB — ECHOCARDIOGRAM COMPLETE
AR max vel: 3.41 cm2
AV Area VTI: 3.15 cm2
AV Area mean vel: 3.16 cm2
AV Mean grad: 3 mm[Hg]
AV Peak grad: 6.3 mm[Hg]
Ao pk vel: 1.25 m/s
Area-P 1/2: 2.5 cm2
Height: 64 in
P 1/2 time: 324 ms
S' Lateral: 2.8 cm
Weight: 2928 [oz_av]

## 2023-02-17 SURGERY — LEFT HEART CATH AND CORONARY ANGIOGRAPHY
Anesthesia: LOCAL

## 2023-02-17 MED ORDER — METOPROLOL SUCCINATE ER 25 MG PO TB24
25.0000 mg | ORAL_TABLET | Freq: Every day | ORAL | Status: DC
  Administered 2023-02-18: 25 mg via ORAL
  Filled 2023-02-17: qty 1

## 2023-02-17 MED ORDER — SODIUM CHLORIDE 0.9 % WEIGHT BASED INFUSION
3.0000 mL/kg/h | INTRAVENOUS | Status: DC
  Administered 2023-02-17: 3 mL/kg/h via INTRAVENOUS

## 2023-02-17 MED ORDER — VERAPAMIL HCL 2.5 MG/ML IV SOLN
INTRAVENOUS | Status: AC
  Filled 2023-02-17: qty 2

## 2023-02-17 MED ORDER — SODIUM CHLORIDE 0.9 % IV SOLN: INTRAVENOUS | Status: AC

## 2023-02-17 MED ORDER — MIDAZOLAM HCL 2 MG/2ML IJ SOLN
INTRAMUSCULAR | Status: AC
Start: 2023-02-17 — End: ?
  Filled 2023-02-17: qty 2

## 2023-02-17 MED ORDER — LOSARTAN POTASSIUM 50 MG PO TABS
100.0000 mg | ORAL_TABLET | Freq: Every day | ORAL | Status: DC
  Administered 2023-02-18: 100 mg via ORAL
  Filled 2023-02-17: qty 2

## 2023-02-17 MED ORDER — MIDAZOLAM HCL 2 MG/2ML IJ SOLN
INTRAMUSCULAR | Status: DC | PRN
  Administered 2023-02-17: 1 mg via INTRAVENOUS

## 2023-02-17 MED ORDER — HEPARIN SODIUM (PORCINE) 1000 UNIT/ML IJ SOLN
INTRAMUSCULAR | Status: AC
  Filled 2023-02-17: qty 10

## 2023-02-17 MED ORDER — SODIUM CHLORIDE 0.9 % WEIGHT BASED INFUSION: 1.0000 mL/kg/h | INTRAVENOUS | Status: DC

## 2023-02-17 MED ORDER — ASPIRIN 81 MG PO TBEC
81.0000 mg | DELAYED_RELEASE_TABLET | Freq: Every day | ORAL | Status: DC
  Administered 2023-02-18: 81 mg via ORAL
  Filled 2023-02-17: qty 1

## 2023-02-17 MED ORDER — ENOXAPARIN SODIUM 40 MG/0.4ML IJ SOSY
40.0000 mg | PREFILLED_SYRINGE | INTRAMUSCULAR | Status: DC
  Administered 2023-02-18: 40 mg via SUBCUTANEOUS
  Filled 2023-02-17: qty 0.4

## 2023-02-17 MED ORDER — IOHEXOL 350 MG/ML SOLN
INTRAVENOUS | Status: DC | PRN
  Administered 2023-02-17: 47 mL

## 2023-02-17 MED ORDER — SODIUM CHLORIDE 0.9% FLUSH: 3.0000 mL | INTRAVENOUS | Status: DC | PRN

## 2023-02-17 MED ORDER — ROSUVASTATIN CALCIUM 20 MG PO TABS
40.0000 mg | ORAL_TABLET | Freq: Every day | ORAL | Status: DC
  Administered 2023-02-18: 40 mg via ORAL
  Filled 2023-02-17: qty 2

## 2023-02-17 MED ORDER — HYDRALAZINE HCL 20 MG/ML IJ SOLN: 10.0000 mg | INTRAMUSCULAR | Status: AC | PRN

## 2023-02-17 MED ORDER — LIDOCAINE HCL (PF) 1 % IJ SOLN
INTRAMUSCULAR | Status: DC | PRN
  Administered 2023-02-17: 2 mL

## 2023-02-17 MED ORDER — SODIUM CHLORIDE 0.9 % IV SOLN: 250.0000 mL | INTRAVENOUS | Status: DC | PRN

## 2023-02-17 MED ORDER — AMLODIPINE BESYLATE 10 MG PO TABS
10.0000 mg | ORAL_TABLET | Freq: Every day | ORAL | Status: DC
  Administered 2023-02-18: 10 mg via ORAL
  Filled 2023-02-17: qty 1

## 2023-02-17 MED ORDER — ONDANSETRON HCL 4 MG/2ML IJ SOLN: 4.0000 mg | Freq: Four times a day (QID) | INTRAMUSCULAR | Status: DC | PRN

## 2023-02-17 MED ORDER — VERAPAMIL HCL 2.5 MG/ML IV SOLN
INTRAVENOUS | Status: DC | PRN
  Administered 2023-02-17 (×2): 10 mL via INTRA_ARTERIAL

## 2023-02-17 MED ORDER — FENTANYL CITRATE (PF) 100 MCG/2ML IJ SOLN
INTRAMUSCULAR | Status: AC
  Filled 2023-02-17: qty 2

## 2023-02-17 MED ORDER — SODIUM CHLORIDE 0.9% FLUSH
3.0000 mL | Freq: Two times a day (BID) | INTRAVENOUS | Status: DC
  Administered 2023-02-17 – 2023-02-18 (×2): 3 mL via INTRAVENOUS

## 2023-02-17 MED ORDER — ACETAMINOPHEN 325 MG PO TABS: 650.0000 mg | ORAL_TABLET | ORAL | Status: DC | PRN

## 2023-02-17 MED ORDER — HEPARIN SODIUM (PORCINE) 1000 UNIT/ML IJ SOLN
INTRAMUSCULAR | Status: DC | PRN
  Administered 2023-02-17: 4000 [IU] via INTRAVENOUS

## 2023-02-17 MED ORDER — LIDOCAINE HCL (PF) 1 % IJ SOLN
INTRAMUSCULAR | Status: AC
  Filled 2023-02-17: qty 30

## 2023-02-17 MED ORDER — ASPIRIN 81 MG PO CHEW: 81.0000 mg | CHEWABLE_TABLET | ORAL | Status: DC

## 2023-02-17 MED ORDER — FENTANYL CITRATE (PF) 100 MCG/2ML IJ SOLN
INTRAMUSCULAR | Status: DC | PRN
  Administered 2023-02-17: 25 ug via INTRAVENOUS

## 2023-02-17 MED ORDER — HEPARIN (PORCINE) IN NACL 1000-0.9 UT/500ML-% IV SOLN
INTRAVENOUS | Status: DC | PRN
  Administered 2023-02-17 (×2): 500 mL

## 2023-02-17 SURGICAL SUPPLY — 7 items
CATH 5FR JL3.5 JR4 ANG PIG MP (CATHETERS) ×1
CATH INFINITI 5FR JL4 (CATHETERS) ×1
DEVICE RAD COMP TR BAND LRG (VASCULAR PRODUCTS) ×1
GLIDESHEATH SLEND SS 6F .021 (SHEATH) ×1
INQWIRE 1.5J .035X260CM (WIRE) ×1
PACK CARDIAC CATHETERIZATION (CUSTOM PROCEDURE TRAY) ×1
SET ATX-X65L (MISCELLANEOUS) ×1

## 2023-02-17 NOTE — Progress Notes (Signed)
  Echocardiogram 2D Echocardiogram has been performed.  Reinaldo Raddle Miko Markwood 02/17/2023, 3:41 PM

## 2023-02-17 NOTE — Consult Note (Addendum)
[address]       [place] [address]             [phone]        Mikki Harbor. [facility] Medical Record #[id number] Date of Birth: [birth year]  Referring: Yvonne Kendall, MD Primary Care: Sherlene Shams, MD Primary Cardiologist:Christopher End, MD   Reason for consult: Evaluation for surgical management of symptomatic severe multivessel coronary artery disease with 80 to 90% distal left main coronary artery stenosis.  History of Present Illness: Mr. Dhairya Bodart is a 72 year old male with history of hypertension, dyslipidemia, and solitary kidney who has been followed by Dr. Cristal Deer End.  A screening chest CT for coronary calcium scoring was recommended and conducted in September of this year.  The coronary calcium score was 1066 in and was the 86 percentile.  When he was seen on follow-up with Dr. Okey Dupre earlier this month, the patient reported having intermittent chest tightness radiating to his throat that had begun in the previous 2 to 3 months.  He also reported having shortness of breath with walking only short distances.  Given these symptoms and the findings of the coronary CT, Dr. Okey Dupre recommended proceeding with elective left heart catheterization.  That study was performed earlier today demonstrating severe three-vessel coronary artery disease with an 80 to 90% distal left main coronary artery stenosis that involves the ostia of both the LAD and circumflex coronary arteries.  In addition, there is moderate total occlusion of the right coronary artery proximally.  We have been asked to evaluate Mr. Doehring for surgical revascularization.   Currently, Mr. Clementeen Graham is resting comfortably in the Cath Lab recovery area.  He denies having any chest pain or shortness of breath.  He continues to work on a regular basis.  He lives alone. Last dental visit was "many years ago because ".  He denies active dental issues currently.    Current Activity/  Functional Status: Patient is independent with mobility/ambulation, transfers, ADL's, IADL's.   Zubrod Score: At the time of surgery this patient's most appropriate activity status/level should be described as: []     0    Normal activity, no symptoms []     1    Restricted in physical strenuous activity but ambulatory, able to do out light work [x]     2    Ambulatory and capable of self care, unable to do work activities, up and about                 more than 50%  Of the time                            []     3    Only limited self care, in bed greater than 50% of waking hours []     4    Completely disabled, no self care, confined to bed or chair []     5    Moribund  Past Medical History:  Diagnosis Date   Aortic regurgitation    HTN (hypertension)    Mitral regurgitation    Other and unspecified hyperlipidemia     Past Surgical History:  Procedure Laterality Date   ANTERIOR CRUCIATE LIGAMENT REPAIR Left 2004   RENAL ANGIOGRAPHY Right 01/08/2018   Procedure: RENAL ANGIOGRAPHY;  Surgeon: Annice Needy, MD;  Location: [facility] INVASIVE CV LAB;  Service: Cardiovascular;  Laterality: Right;  Social History   Tobacco Use  Smoking Status Never  Smokeless Tobacco Never    Social History   Substance and Sexual Activity  Alcohol Use Yes   Alcohol/week: 0.0 standard drinks of alcohol   Comment: 1 drink every few months     No Known Allergies  Current Facility-Administered Medications  Medication Dose Route Frequency Provider Last Rate Last Admin   0.9% sodium chloride infusion  1 mL/kg/hr Intravenous Continuous End, Christopher, MD 83 mL/hr at 02/17/23 0854 1 mL/kg/hr at 02/17/23 0854   aspirin chewable tablet 81 mg  81 mg Oral Pre-Cath End, Christopher, MD       fentaNYL (SUBLIMAZE) injection    PRN End, Cristal Deer, MD   25 mcg at 02/17/23 0951   Heparin (Porcine) in NaCl 1000-0.9 UT/500ML-% SOLN    PRN End, Cristal Deer, MD   500 mL at 02/17/23 1002   heparin sodium (porcine)  injection    PRN End, Cristal Deer, MD   4,000 Units at 02/17/23 0956   iohexol (OMNIPAQUE) 350 MG/ML injection    PRN End, Cristal Deer, MD   47 mL at 02/17/23 1013   lidocaine (PF) (XYLOCAINE) 1 % injection    PRN End, Cristal Deer, MD   2 mL at 02/17/23 0953   midazolam (VERSED) injection    PRN End, Cristal Deer, MD   1 mg at 02/17/23 2536   Radial Cocktail/Verapamil only    PRN End, Cristal Deer, MD   10 mL at 02/17/23 1013    Medications Prior to Admission  Medication Sig Dispense Refill Last Dose   amLODipine (NORVASC) 10 MG tablet TAKE 1 TABLET BY MOUTH DAILY 90 tablet 2 02/17/2023 at 0530   aspirin EC 81 MG tablet Take 1 tablet (81 mg total) by mouth daily. Swallow whole.   02/17/2023 at 0530   losartan (COZAAR) 100 MG tablet TAKE 1 TABLET BY MOUTH DAILY 90 tablet 1 02/17/2023   metoprolol succinate (TOPROL-XL) 25 MG 24 hr tablet Take 1 tablet (25 mg total) by mouth daily. 90 tablet 1 02/17/2023   rosuvastatin (CRESTOR) 20 MG tablet Take 1 tablet (20 mg total) by mouth daily. 90 tablet 3 02/17/2023   doxycycline (VIBRA-TABS) 100 MG tablet TAKE ONE TABLET BY MOUTH TWICE DAILY (Patient not taking: Reported on 09/10/2022) 20 tablet 0 More than a month   nitroGLYCERIN (NITROSTAT) 0.4 MG SL tablet Place 1 tablet (0.4 mg total) under the tongue every 5 (five) minutes as needed. 25 tablet 3     Family History  Problem Relation Age of Onset   Dementia Mother    Heart disease Father 84       CABG   Mitral valve prolapse Father    Aortic aneurysm Father    Heart disease Paternal Uncle        Valve replacements   Hyperlipidemia Other        family Hx     Review of Systems:      Cardiac Review of Systems: Y or  [    ]= no  Chest Pain [    ]  Resting SOB [   ] Exertional SOB  [ x ]  Orthopnea [  ]   Pedal Edema [   ]    Palpitations [  ] Syncope  [  ]   Presyncope [   ]  General Review of Systems: [Y] = yes [  ]=no Constitional: recent weight change [ x ]; anorexia [  ]; fatigue [  ];  nausea [  ];  night sweats [  ]; fever [  ]; or chills [  ]                                                               Dental: Last Dentist visit: "Many years ago"  Eye : blurred vision [  ]; diplopia [   ]; vision changes [  ];  Amaurosis fugax[  ]; Resp: cough [  ];  wheezing[  ];  hemoptysis[  ]; shortness of breath[ x ]; paroxysmal nocturnal dyspnea[  ]; dyspnea on exertion[  ]; or orthopnea[  ];  GI:  gallstones[  ], vomiting[  ];  dysphagia[  ]; melena[  ];  hematochezia [  ]; heartburn[  ];   Hx of  Colonoscopy[  ]; GU: kidney stones [  ]; hematuria[  ];   dysuria [  ];  nocturia[  ];  history of     obstruction [  ]; urinary frequency [  ]             Skin: rash, swelling[  ];, hair loss[  ];  peripheral edema[  ];  or itching[  ]; Musculosketetal: myalgias[  ];  joint swelling[  ];  joint erythema[  ];  joint pain[  ];  back pain[  ];  Heme/Lymph: bruising[  ];  bleeding[  ];  anemia[  ];  Neuro: TIA[  ];  headaches[  ];  stroke[  ];  vertigo[  ];  seizures[  ];   paresthesias[  ];  difficulty walking[  ];  Psych:depression[  ]; anxiety[  ];  Endocrine: diabetes[  ];  thyroid dysfunction[  ];              Physical Exam: BP 120/75   Pulse 65   Temp 97.6 F (36.4 C) (Temporal)   Resp 13   Ht 5\' 4"  (1.626 m)   Wt 83 kg   SpO2 97%   BMI 31.41 kg/m    General appearance: alert, cooperative, and no distress Head: Normocephalic, without obvious abnormality, atraumatic Neck: no adenopathy, no carotid bruit, and no JVD Lymph nodes: No cervical or clavicular adenopathy Resp: clear to auscultation bilaterally Cardio: Regular rate and rhythm, no murmur Abdomen: Soft, nontender Extremities: All warm and well-perfused.  There is a TR band overlying the right radial artery.  All other distal pulses are easily palpable.  No peripheral edema.  No significant varicosities in the lower extremities. Neuro: Grossly nonfocal   Diagnostic Studies & Laboratory data:  BRIEF CARDIAC  CATHETERIZATION NOTE   02/17/2023   10:57 AM   PATIENT:  Mikki Harbor.  72 y.o. male   PRE-OPERATIVE DIAGNOSIS:  Accelerating angina   POST-OPERATIVE DIAGNOSIS:  Same   PROCEDURE:  Procedure(s): LEFT HEART CATH AND CORONARY ANGIOGRAPHY (N/A)   SURGEON:  Surgeons and Role:    * End, Cristal Deer, MD - Primary   FINDINGS: Severe multivessel CAD including 80-90% distal LMCA disease involving ostia of LAD and LCx, multifocal LAD disease of up to 90%, and chronic total occlusion of proximal RCA. Normal left ventricular systolic function and filling pressure.   RECOMMENDATIONS: Admit for inpatient cardiac surgery consultation. Escalate rosuvastatin; continue aggressive secondary prevention of CAD. Coronary Diagrams  Diagnostic Dominance: Right    Cristal Deer  End, MD [facility]       Recent Radiology Findings:     ADDENDUM REPORT: 12/20/2022 16:21   EXAM: OVER-READ INTERPRETATION CT CHEST   The following report is an over-read performed by radiologist Dr. Romona Curls of [facility] on 12/20/2022. This over-read does not include interpretation of cardiac or coronary anatomy or pathology. The coronary calcium score interpretation by the cardiologist is attached.   COMPARISON:  None.   FINDINGS: Cardiovascular: No significant extracardiac vascular findings. Normal heart size. No pericardial effusion.   Mediastinum/Nodes: No enlarged mediastinal lymph nodes. The visible trachea and esophagus demonstrate no significant findings.   Lungs/Pleura: The visible lungs are clear. No pleural effusion.   Upper Abdomen: No acute abnormality.   Musculoskeletal: Degenerative changes are seen in the spine.   IMPRESSION: No significant incidental findings.     Electronically Signed   By: Romona Curls M.D.   On: 12/20/2022 16:21    Addended by Okey Dupre, MD on 12/20/2022  4:23 PM    Study Result  Narrative & Impression  CLINICAL DATA:   Risk stratification   EXAM: Coronary Calcium Score   TECHNIQUE: The patient was scanned on a Siemens Somatom scanner. Axial non-contrast 3 mm slices were carried out through the heart. The data set was analyzed on a dedicated work station and scored using the Agatson method.   FINDINGS: Non-cardiac: See separate report from [facility].   Ascending Aorta: Mild ascending aorta dilation. Measuring 41 mm in diameter.   Pericardium: Normal   Coronary arteries: Normal origin of left and right coronary arteries. Distribution of arterial calcifications if present, as noted below;   LM 0   LAD 804   LCx 196   RCA 65.9   Total 1066   IMPRESSION AND RECOMMENDATION: 1. Coronary calcium score of 1066. This was 86th percentile for age and sex matched control.   2. CAC >300 in LAD, LCx, RCA. CAC-DRS A3/N3.   3. Recommend aspirin and statin if no contraindication.   4. Recommend cardiology consultation.   5. Continue heart healthy lifestyle and risk factor modification.   Electronically Signed: By: Debbe Odea M.D. On: 12/05/2022 17:45        I have independently reviewed the above radiologic studies and discussed with the patient   Recent Lab Findings: Lab Results  Component Value Date   WBC 8.6 02/06/2023   HGB 15.7 02/06/2023   HCT 47.3 02/06/2023   PLT 234 02/06/2023   GLUCOSE 90 02/06/2023   CHOL 181 09/12/2022   TRIG 144.0 09/12/2022   HDL 36.60 (L) 09/12/2022   LDLDIRECT 133.0 09/12/2022   LDLCALC 116 (H) 09/12/2022   ALT 23 09/12/2022   AST 16 09/12/2022   NA 138 02/06/2023   K 4.5 02/06/2023   CL 99 02/06/2023   CREATININE 1.12 02/06/2023   BUN 15 02/06/2023   CO2 20 02/06/2023   TSH 2.65 09/12/2022   HGBA1C 5.9 07/12/2021      Assessment / Plan:    -72 year old male with recent symptoms of exertional angina and a positive calcium scoring CT and elected for catheterization earlier today demonstrating severe left main and  multivessel coronary artery disease.  He has not yet had an echocardiogram but left ventriculogram shows preserved left ventricular function.  We agree that coronary bypass grafting is his best option for revascularization.  The procedure and expected perioperative recovery were discussed with him in detail.  He would like for [place] to proceed with  preoperative testing and planning.  He is currently stable and pain-free.  Heparin infusion has been ordered but not yet started at the time of this encounter.  Dr. Dorris Fetch will see Mr. Kaspar and further details regarding timing of surgery will follow.  Will proceed with preoperative testing including carotid duplex, lower extremity arterial duplex scan, and echocardiogram.   I  spent 20 minutes counseling the patient face to face.  Leary Roca, PA-C  02/17/2023 11:41 AM  I have seen and examined Mr. Vanhousen, reviewed his records and his cath images.  72 yo man with no prior cardiac history presents with exertional throat tightness and shortness of breath.  Coronary Ct showed severe CAD.  Cath today showed left main disease with CTO of RCA.  CABG indicated for survival benefit and relief of symptoms.  I discussed the general nature of the procedure, including the need for general anesthesia, the incisions to be used, the use of cardiopulmonary bypass, and the use of drainage tubes and temporary pacemaker wires with Mr. Deneault.  We discussed the expected hospital stay, overall recovery and short and long term outcomes.  I informed him of the indications, risks, benefits and alternatives.  He understands the risks include, but are not limited to death, stroke, MI, DVT/PE, bleeding, possible need for transfusion, infections, cardiac arrhythmias, as well as other organ system dysfunction including respiratory, renal, or GI complications.  Increased risk for renal issues due to single kidney.   He accepts the risks and agrees to proceed.   Timing of  CABG TBD  Willean Schurman C. Dorris Fetch, MD [facility] [phone]

## 2023-02-17 NOTE — Interval H&P Note (Signed)
History and Physical Interval Note:  02/17/2023 9:46 AM  Frederick Pace.  has presented today for surgery, with the diagnosis of accelerating angina.  The various methods of treatment have been discussed with the patient and family. After consideration of risks, benefits and other options for treatment, the patient has consented to  Procedure(s): LEFT HEART CATH AND CORONARY ANGIOGRAPHY (N/A) as a surgical intervention.  The patient's history has been reviewed, patient examined, no change in status, stable for surgery.  I have reviewed the patient's chart and labs.  Questions were answered to the patient's satisfaction.    Cath Lab Visit (complete for each Cath Lab visit)  Clinical Evaluation Leading to the Procedure:   ACS: No.  Non-ACS:    Anginal Classification: CCS III  Anti-ischemic medical therapy: Minimal Therapy (1 class of medications)  Non-Invasive Test Results: No non-invasive testing performed  Prior CABG: No previous CABG  Frederick Pace

## 2023-02-17 NOTE — Plan of Care (Signed)
Problem: Education: Goal: Understanding of CV disease, CV risk reduction, and recovery process will improve Outcome: Progressing   Problem: Cardiovascular: Goal: Vascular access site(s) Level 0-1 will be maintained Outcome: Progressing   Problem: Education: Goal: Knowledge of General Education information will improve Description: Including pain rating scale, medication(s)/side effects and non-pharmacologic comfort measures Outcome: Progressing

## 2023-02-17 NOTE — Brief Op Note (Signed)
BRIEF CARDIAC CATHETERIZATION NOTE  02/17/2023  10:57 AM  PATIENT:  Frederick Pace.  72 y.o. male  PRE-OPERATIVE DIAGNOSIS:  Accelerating angina  POST-OPERATIVE DIAGNOSIS:  Same  PROCEDURE:  Procedure(s): LEFT HEART CATH AND CORONARY ANGIOGRAPHY (N/A)  SURGEON:  Surgeons and Role:    * Eliya Geiman, Cristal Deer, MD - Primary  FINDINGS: Severe multivessel CAD including 80-90% distal LMCA disease involving ostia of LAD and LCx, multifocal LAD disease of up to 90%, and chronic total occlusion of proximal RCA. Normal left ventricular systolic function and filling pressure.  RECOMMENDATIONS: Admit for inpatient cardiac surgery consultation. Escalate rosuvastatin; continue aggressive secondary prevention of CAD.  Yvonne Kendall, MD [facility]

## 2023-02-18 ENCOUNTER — Other Ambulatory Visit: Payer: Self-pay | Admitting: *Deleted

## 2023-02-18 ENCOUNTER — Encounter: Payer: Self-pay | Admitting: *Deleted

## 2023-02-18 ENCOUNTER — Inpatient Hospital Stay (HOSPITAL_BASED_OUTPATIENT_CLINIC_OR_DEPARTMENT_OTHER): Payer: Medicare Other

## 2023-02-18 ENCOUNTER — Encounter (HOSPITAL_COMMUNITY): Payer: Self-pay | Admitting: Internal Medicine

## 2023-02-18 ENCOUNTER — Other Ambulatory Visit (HOSPITAL_COMMUNITY): Payer: Medicare Other

## 2023-02-18 DIAGNOSIS — I25118 Atherosclerotic heart disease of native coronary artery with other forms of angina pectoris: Secondary | ICD-10-CM | POA: Diagnosis not present

## 2023-02-18 DIAGNOSIS — Z79899 Other long term (current) drug therapy: Secondary | ICD-10-CM | POA: Diagnosis not present

## 2023-02-18 DIAGNOSIS — Z7982 Long term (current) use of aspirin: Secondary | ICD-10-CM | POA: Diagnosis not present

## 2023-02-18 DIAGNOSIS — I2510 Atherosclerotic heart disease of native coronary artery without angina pectoris: Secondary | ICD-10-CM | POA: Diagnosis present

## 2023-02-18 DIAGNOSIS — I10 Essential (primary) hypertension: Secondary | ICD-10-CM | POA: Diagnosis not present

## 2023-02-18 DIAGNOSIS — Z01810 Encounter for preprocedural cardiovascular examination: Secondary | ICD-10-CM

## 2023-02-18 DIAGNOSIS — I200 Unstable angina: Secondary | ICD-10-CM | POA: Diagnosis not present

## 2023-02-18 DIAGNOSIS — I25119 Atherosclerotic heart disease of native coronary artery with unspecified angina pectoris: Secondary | ICD-10-CM | POA: Diagnosis not present

## 2023-02-18 LAB — CBC
HCT: 40.9 % (ref 39.0–52.0)
Hemoglobin: 13.9 g/dL (ref 13.0–17.0)
MCH: 30.2 pg (ref 26.0–34.0)
MCHC: 34 g/dL (ref 30.0–36.0)
MCV: 88.7 fL (ref 80.0–100.0)
Platelets: 201 10*3/uL (ref 150–400)
RBC: 4.61 MIL/uL (ref 4.22–5.81)
RDW: 12.3 % (ref 11.5–15.5)
WBC: 8.2 10*3/uL (ref 4.0–10.5)
nRBC: 0 % (ref 0.0–0.2)

## 2023-02-18 LAB — COMPREHENSIVE METABOLIC PANEL
ALT: 23 U/L (ref 0–44)
AST: 18 U/L (ref 15–41)
Albumin: 3.7 g/dL (ref 3.5–5.0)
Alkaline Phosphatase: 63 U/L (ref 38–126)
Anion gap: 8 (ref 5–15)
BUN: 15 mg/dL (ref 8–23)
CO2: 22 mmol/L (ref 22–32)
Calcium: 8.8 mg/dL — ABNORMAL LOW (ref 8.9–10.3)
Chloride: 105 mmol/L (ref 98–111)
Creatinine, Ser: 1.06 mg/dL (ref 0.61–1.24)
GFR, Estimated: >60 mL/min (ref >60)
Glucose, Bld: 101 mg/dL — ABNORMAL HIGH (ref 70–99)
Potassium: 3.9 mmol/L (ref 3.5–5.1)
Sodium: 135 mmol/L (ref 135–145)
Total Bilirubin: 0.9 mg/dL (ref <1.2)
Total Protein: 6.8 g/dL (ref 6.5–8.1)

## 2023-02-18 LAB — HEMOGLOBIN A1C
Hgb A1c MFr Bld: 5.6 % (ref 4.8–5.6)
Mean Plasma Glucose: 114.02 mg/dL

## 2023-02-18 MED ORDER — ORAL CARE MOUTH RINSE: 15.0000 mL | OROMUCOSAL | Status: DC | PRN

## 2023-02-18 MED ORDER — ROSUVASTATIN CALCIUM 40 MG PO TABS: 40.0000 mg | ORAL_TABLET | Freq: Every day | ORAL | 1 refills | Status: DC

## 2023-02-18 NOTE — Plan of Care (Signed)
  Problem: Cardiovascular: Goal: Ability to achieve and maintain adequate cardiovascular perfusion will improve Outcome: Progressing   Problem: Clinical Measurements: Goal: Will remain free from infection Outcome: Progressing   Problem: Activity: Goal: Risk for activity intolerance will decrease Outcome: Progressing   Problem: Safety: Goal: Ability to remain free from injury will improve Outcome: Progressing   Problem: Skin Integrity: Goal: Risk for impaired skin integrity will decrease Outcome: Progressing

## 2023-02-18 NOTE — Progress Notes (Signed)
1 Day Post-Op Procedure(s) (LRB): LEFT HEART CATH AND CORONARY ANGIOGRAPHY (N/A) Subjective: No complaints overnight, wants to go home  Objective: Vital signs in last 24 hours: Temp:  [98.2 F (36.8 C)-98.6 F (37 C)] 98.4 F (36.9 C) (11/26 0820) Pulse Rate:  [59-88] 81 (11/26 0820) Cardiac Rhythm: Sinus tachycardia (11/26 0738) Resp:  [12-22] 16 (11/26 0820) BP: (107-146)/(64-85) 107/73 (11/26 0820) SpO2:  [93 %-99 %] 96 % (11/26 0820) Weight:  [84.4 kg] 84.4 kg (11/26 0345)  Hemodynamic parameters for last 24 hours:    Intake/Output from previous day: 11/25 0701 - 11/26 0700 In: 1595.2 [P.O.:220; I.V.:1375.2] Out: 1500 [Urine:1500] Intake/Output this shift: Total I/O In: 297 [P.O.:297] Out: 240 [Urine:240]  General appearance: alert, cooperative, and no distress Heart: regular rate and rhythm  Lab Results: Recent Labs    02/18/23 0305  WBC 8.2  HGB 13.9  HCT 40.9  PLT 201   BMET:  Recent Labs    02/18/23 0305  NA 135  K 3.9  CL 105  CO2 22  GLUCOSE 101*  BUN 15  CREATININE 1.06  CALCIUM 8.8*    PT/INR: No results for input(s): "LABPROT", "INR" in the last 72 hours. ABG No results found for: "PHART", "HCO3", "TCO2", "ACIDBASEDEF", "O2SAT" CBG (last 3)  No results for input(s): "GLUCAP" in the last 72 hours.  Assessment/Plan: S/P Procedure(s) (LRB): LEFT HEART CATH AND CORONARY ANGIOGRAPHY (N/A) Left main disease with angina I offered to do CABG tomorrow, but he very strongly wishes to go home D/w Dr. Excell Seltzer, he will speak with Mr. Monastra as well If he decides to stay will add on to schedule   LOS: 1 day    Loreli Slot 02/18/2023

## 2023-02-18 NOTE — TOC Transition Note (Signed)
Transition of Care [facility]) - CM/SW Discharge Note   Patient Details  Name: Frederick Pace. MRN: [id number] Date of Birth: 10-29-1950  [facility] CM/SW Contact:  Leone Haven, RN Phone Number: 02/18/2023, 12:02 PM   Clinical Narrative:    For possible dc today, has no needs. He states a friend will transport him home.   Final next level of care: Home/Self Care Barriers to Discharge: No Barriers Identified   Patient Goals and CMS Choice   Choice offered to / list presented to : NA  Discharge Placement                         Discharge Plan and Services Additional resources added to the After Visit Summary for   In-house Referral: NA Discharge Planning Services: CM Consult Post Acute Care Choice: NA          DME Arranged: N/A DME Agency: NA       HH Arranged: NA          Social Determinants of Health (SDOH) Interventions SDOH Screenings   Food Insecurity: No Food Insecurity (02/17/2023)  Housing: Low Risk  (02/17/2023)  Transportation Needs: No Transportation Needs (02/17/2023)  Utilities: Not At Risk (02/17/2023)  Alcohol Screen: Low Risk  (08/13/2022)  Depression (PHQ2-9): Low Risk  (09/10/2022)  Financial Resource Strain: Low Risk  (08/13/2022)  Physical Activity: Insufficiently Active (08/13/2022)  Social Connections: Socially Isolated (08/13/2022)  Stress: No Stress Concern Present (08/13/2022)  Tobacco Use: Low Risk  (02/17/2023)     Readmission Risk Interventions     No data to display

## 2023-02-18 NOTE — Progress Notes (Signed)
Patient has been given discharge instructions to include medications and follow up appointment. He verbalizes understanding.

## 2023-02-18 NOTE — Care Management Obs Status (Signed)
MEDICARE OBSERVATION STATUS NOTIFICATION   Patient Details  Name: Frederick Pace. MRN: [id number] Date of Birth: 15-Nov-1950   Medicare Observation Status Notification Given:  Yes    Leone Haven, RN 02/18/2023, 2:37 PM

## 2023-02-18 NOTE — Progress Notes (Signed)
   Patient Name: Marcell Hocevar. Date of Encounter: 02/18/2023 [facility] Cardiologist: Yvonne Kendall, MD   Interval Summary  .    Feeling well today.  No chest pain or shortness of breath.  Just returned from pre-CABG Doppler studies.  Eager to go home.  Vital Signs .    Vitals:   02/18/23 0053 02/18/23 0345 02/18/23 0820 02/18/23 1116  BP: 133/64 (!) 146/85 107/73 121/74  Pulse: 88 65 81 69  Resp: 18 18 16 16   Temp: 98.6 F (37 C) 98.5 F (36.9 C) 98.4 F (36.9 C) 98.4 F (36.9 C)  TempSrc: Oral Oral Oral Oral  SpO2: 99% 99% 96% 98%  Weight:  84.4 kg    Height:        Intake/Output Summary (Last 24 hours) at 02/18/2023 1140 Last data filed at 02/18/2023 1610 Gross per 24 hour  Intake 1892.23 ml  Output 1740 ml  Net 152.23 ml      02/18/2023    3:45 AM 02/17/2023    7:27 AM 02/06/2023    9:10 AM  Last 3 Weights  Weight (lbs) 186 lb 183 lb 189 lb 6.4 oz  Weight (kg) 84.369 kg 83.008 kg 85.911 kg      Telemetry/ECG    Sinus rhythm- Personally Reviewed  Physical Exam .   GEN: No acute distress.   Neck: No JVD Cardiac: RRR, no murmurs, rubs, or gallops.  Right radial cath site clear Respiratory: Clear to auscultation bilaterally. GI: Soft, nontender, non-distended  MS: No edema  Assessment & Plan .     1.  CAD with angina, newly diagnosed left main and multivessel disease 2.  Hypertension, controlled 3.  Mixed hyperlipidemia: Recently started on rosuvastatin, goal LDL cholesterol should be less than 55.  He has been somewhat resistant to take statin medicines in the past based on chart review.  The patient appears clinically stable.  He has not had any resting angina or angina even with low-level activity.  Despite that, he has very severe left main disease.  I recommended that he stay here for bypass surgery tomorrow, but the patient is insistent on going home and returning next week for surgery.  After discussion of risks, he understands  there is a low but not zero risk of having a serious complication such as heart attack or cardiac death.  He is still insistent on going home and coming back for surgery.  Will prescribe him sublingual nitroglycerin as needed.  He understands that if rest pain occurs, he should take nitroglycerin and call 911.  Otherwise he will continue on aspirin, a beta-blocker, amlodipine, and a high intensity statin drug.  Will let the surgical team know that the patient will return next week as planned for multivessel CABG.  For questions or updates, please contact [facility] Please consult [email or web address] for contact info under        Signed, Tonny Bollman, MD

## 2023-02-18 NOTE — Progress Notes (Signed)
Mobility Specialist Progress Note:    02/18/23 1344  Mobility  Activity Ambulated with assistance in hallway  Level of Assistance Modified independent, requires aide device or extra time  Assistive Device None  Distance Ambulated (ft) 200 ft  Activity Response Tolerated well  Mobility Referral Yes  $Mobility charge 1 Mobility  Mobility Specialist Start Time (ACUTE ONLY) 1151  Mobility Specialist Stop Time (ACUTE ONLY) 1201  Mobility Specialist Time Calculation (min) (ACUTE ONLY) 10 min   Received pt in bed having no complaints and agreeable to mobility. Pt was asymptomatic throughout ambulation, VSS. Returned to room w/o fault. Left in bed w/ call bell in reach and all needs met.   Thompson Grayer Mobility Specialist  Please contact vis Secure Chat or  Rehab Office [phone]

## 2023-02-18 NOTE — TOC Initial Note (Signed)
Transition of Care (TOC) - Initial/Assessment Note    Patient Details  Name: Frederick Pace. MRN: [id number] Date of Birth: [birth year]  Transition of Care [facility]) CM/SW Contact:    Leone Haven, RN Phone Number: 02/18/2023, 12:01 PM  Clinical Narrative:                 From home alone, indep,, has PCP and insurance on file, states has no HH services in place at this time or DME at home.  States friend will transport him  home at [employer] and friend is support system, states gets medications from [facility].  Pta self ambulatory.   Expected Discharge Plan: Home/Self Care Barriers to Discharge: No Barriers Identified   Patient Goals and CMS Choice Patient states their goals for this hospitalization and ongoing recovery are:: return home   Choice offered to / list presented to : NA      Expected Discharge Plan and Services In-house Referral: NA Discharge Planning Services: CM Consult Post Acute Care Choice: NA Living arrangements for the past 2 months: Single Family Home                 DME Arranged: N/A DME Agency: NA       HH Arranged: NA          Prior Living Arrangements/Services Living arrangements for the past 2 months: Single Family Home Lives with:: Self Patient language and need for interpreter reviewed:: Yes Do you feel safe going back to the place where you live?: Yes      Need for Family Participation in Patient Care: No (Comment) Care giver support system in place?: Yes (comment)   Criminal Activity/Legal Involvement Pertinent to Current Situation/Hospitalization: No - Comment as needed  Activities of Daily Living   ADL Screening (condition at time of admission) Independently performs ADLs?: Yes (appropriate for developmental age) Is the patient deaf or have difficulty hearing?: Yes Does the patient have difficulty seeing, even when wearing glasses/contacts?: No Does the patient have difficulty concentrating, remembering, or making  decisions?: No  Permission Sought/Granted Permission sought to share information with : Case Manager Permission granted to share information with : Yes, Verbal Permission Granted              Emotional Assessment Appearance:: Appears stated age Attitude/Demeanor/Rapport: Engaged Affect (typically observed): Appropriate Orientation: : Oriented to Self, Oriented to Place, Oriented to  Time, Oriented to Situation Alcohol / Substance Use: Not Applicable Psych Involvement: No (comment)  Admission diagnosis:  Accelerating angina (HCC) [I20.0] Patient Active Problem List   Diagnosis Date Noted   Accelerating angina (HCC) 02/06/2023   Pure hypercholesterolemia 01/03/2022   Right bundle branch block 01/03/2022   Arthritis of right hand 06/11/2020   White coat syndrome with hypertension 06/11/2020   Prostate cancer screening 07/25/2019   Solitary kidney 02/13/2018   Renovascular hypertension 12/12/2017   Abnormal finding on diagnostic imaging of left kidney 11/30/2017   Renal artery stenosis (HCC) 11/30/2017   Diuretic-induced hypokalemia 11/23/2017   Mitral valve insufficiency and aortic valve insufficiency 11/23/2017   Obesity (BMI 30-39.9) 11/23/2017   Tick bites 11/02/2015   Myalgia 11/02/2015   Arthralgia 11/02/2015   Visit for preventive health examination 05/30/2015   Cervical disc disorder 12/14/2014   Vaccine counseling 06/22/2014   Mixed hyperlipidemia 05/11/2009   Essential hypertension 05/11/2009   PCP:  Sherlene Shams, MD Pharmacy:   [facility] - [place], [place] - [address] [place] [address] Phone: [phone] Fax: [phone]     Social Determinants of Health (SDOH) Social History: SDOH Screenings   Food Insecurity: No Food Insecurity (02/17/2023)  Housing: Low Risk  (02/17/2023)  Transportation Needs: No Transportation Needs (02/17/2023)  Utilities: Not At Risk (02/17/2023)  Alcohol Screen: Low Risk  (08/13/2022)   Depression (PHQ2-9): Low Risk  (09/10/2022)  Financial Resource Strain: Low Risk  (08/13/2022)  Physical Activity: Insufficiently Active (08/13/2022)  Social Connections: Socially Isolated (08/13/2022)  Stress: No Stress Concern Present (08/13/2022)  Tobacco Use: Low Risk  (02/17/2023)   SDOH Interventions:     Readmission Risk Interventions     No data to display

## 2023-02-18 NOTE — Care Management CC44 (Signed)
Condition Code 44 Documentation Completed  Patient Details  Name: Frederick Pace. MRN: [id number] Date of Birth: 08-17-1950   Condition Code 44 given:  Yes Patient signature on Condition Code 44 notice:  Yes Documentation of 2 MD's agreement:  Yes Code 44 added to claim:  Yes    Leone Haven, RN 02/18/2023, 2:38 PM

## 2023-02-18 NOTE — Care Management Obs Status (Signed)
MEDICARE OBSERVATION STATUS NOTIFICATION   Patient Details  Name: Frederick Pace. MRN: [id number] Date of Birth: 21-Jun-1950   Medicare Observation Status Notification Given:  Yes    Leone Haven, RN 02/18/2023, 2:37 PM

## 2023-02-18 NOTE — Discharge Summary (Signed)
Discharge Summary    Patient ID: Frederick Pace. MRN: [id number]; DOB: 08/23/1950  Admit date: 02/17/2023 Discharge date: 02/18/2023  PCP:  Sherlene Shams, MD   [facility] Providers Cardiologist:  Yvonne Kendall, MD    Discharge Diagnoses    Principal Problem:   Accelerating angina [facility]) Active Problems:   Mixed hyperlipidemia   Essential hypertension   CAD (coronary artery disease)  Diagnostic Studies/Procedures    Cath: 02/17/2023  Conclusions: Severe three-vessel coronary artery disease, including severe distal LMCA lesion involving ostia of LAD and LCx, as detailed below. Normal left ventricular systolic function and filling pressure.   Recommendations: Given severity of disease, admit for inpatient cardiac surgery consultation. Escalate rosuvastatin to 40 mg daily; continue aggressive secondary prevention of coronary artery disease. Obtain echocardiogram.   Yvonne Kendall, MD [facility]  Diagnostic Dominance: Right  _____________   History of Present Illness     Frederick Pace. is a 72 y.o. male with history of coronary artery calcification (CAC 1066), hypertension, hyperlipidemia, and solitary kidney presenting for follow-up of CAD. He was seen in the office last year with Dr. Okey Dupre at which time he was feeling well with the exception of low-back pain.  He was reluctant to initiate statin therapy despite a long history of hyperlipidemia.  Plan was to obtain a coronary calcium score, which was ultimately completed in 11/2022 and returned very high.  He was seen back in the office on 11/14 for follow up regarding management.   Frederick Pace reported that he had been experiencing intermittent tightness in his upper chest and throat with activity began about 2 to 3 months ago.  He has also been having some shortness of breath when walking relatively short distances like from his car to our office today.  He saw ENT because of the tightness in his  throat and was placed on omeprazole without any improvement.  He has not had any symptoms at rest.  He denied palpitations, lightheadedness, and edema.  He reported having put on about 10 to 12 pounds in the last few months because of "eating too much."  It was recommended that he undergo outpatient cardiac cath.   Hospital Course     Consultants: TCTS   CAD -- underwent cardiac cath noted above with severe 3v CAD including severe dLM, along with LAD and LCx. He was admitted with recommendations for TCTS consult. Seen by Dr Dorris Fetch with option to undergo inpatient CABG. Also seen by Dr. Excell Seltzer who again recommended that he remain inpatient for surgery but the patient remained insistent on going home and returning for surgery.  -- continue ASA, statin, BB, amlodipine and SL NTG at discharge  -- he will be set up for outpatient CABG and instructions given by surgical team -- given strict ER return precautions at discharge   HTN -- continue amlodipine, BB, losartan  HLD -- on statin  Did the patient have an acute coronary syndrome (MI, NSTEMI, STEMI, etc) this admission?:  No                               Did the patient have a percutaneous coronary intervention (stent / angioplasty)?:  No.   _____________  Discharge Vitals Blood pressure 121/74, pulse 69, temperature 98.4 F (36.9 C), temperature source Oral, resp. rate 16, height 5\' 4"  (1.626 m), weight 84.4 kg, SpO2 98%.  Filed Weights   02/17/23 [phone]  02/18/23 0345  Weight: 83 kg 84.4 kg    Labs & Radiologic Studies    CBC Recent Labs    02/18/23 0305  WBC 8.2  HGB 13.9  HCT 40.9  MCV 88.7  PLT 201   Basic Metabolic Panel Recent Labs    16/10/96 0305  NA 135  K 3.9  CL 105  CO2 22  GLUCOSE 101*  BUN 15  CREATININE 1.06  CALCIUM 8.8*   Liver Function Tests Recent Labs    02/18/23 0305  AST 18  ALT 23  ALKPHOS 63  BILITOT 0.9  PROT 6.8  ALBUMIN 3.7   No results for input(s): "LIPASE", "AMYLASE" in  the last 72 hours. High Sensitivity Troponin:   No results for input(s): "TROPONINIHS" in the last 720 hours.  BNP Invalid input(s): "POCBNP" D-Dimer No results for input(s): "DDIMER" in the last 72 hours. Hemoglobin A1C Recent Labs    02/18/23 0305  HGBA1C 5.6   Fasting Lipid Panel Recent Labs    02/17/23 1545  CHOL 136  HDL 31*  LDLCALC 81  TRIG 045  CHOLHDL 4.4   Thyroid Function Tests No results for input(s): "TSH", "T4TOTAL", "T3FREE", "THYROIDAB" in the last 72 hours.  Invalid input(s): "FREET3" _____________  VAS US DOPPLER PRE CABG  Result Date: 02/18/2023 PREOPERATIVE VASCULAR EVALUATION Patient Name:  Frederick Pace.  Date of Exam:   02/18/2023 Medical Rec #: [id number]             Accession #:    [id number] Date of Birth: 1950/10/03              Patient Gender: M Patient Age:   72 years Exam Location:  [facility] Procedure:      VAS US DOPPLER PRE CABG Referring Phys: Evelene Croon --------------------------------------------------------------------------------  Indications:      Pre-CABG and Coronary artery calcification. Risk Factors:     Hypertension, hyperlipidemia, coronary artery disease. Comparison Study: No prior exam. Performing Technologist: San Jetty  Examination Guidelines: A complete evaluation includes B-Pace imaging, spectral Doppler, color Doppler, and power Doppler as needed of all accessible portions of each vessel. Bilateral testing is considered an integral part of a complete examination. Limited examinations for reoccurring indications may be performed as noted.  Right Carotid Findings: +----------+--------+--------+--------+--------+--------+           PSV cm/sEDV cm/sStenosisDescribeComments +----------+--------+--------+--------+--------+--------+ CCA Prox  127     20                               +----------+--------+--------+--------+--------+--------+ CCA Distal107     23                                +----------+--------+--------+--------+--------+--------+ ICA Prox  64      13                               +----------+--------+--------+--------+--------+--------+ ICA Mid   82      24                               +----------+--------+--------+--------+--------+--------+ ICA Distal113     31                               +----------+--------+--------+--------+--------+--------+  ECA       150     16                               +----------+--------+--------+--------+--------+--------+ +----------+--------+-------+--------+------------+           PSV cm/sEDV cmsDescribeArm Pressure +----------+--------+-------+--------+------------+ Subclavian153     12                          +----------+--------+-------+--------+------------+ +---------+--------+--+--------+--+ VertebralPSV cm/s29EDV cm/s11 +---------+--------+--+--------+--+ Left Carotid Findings: +----------+--------+--------+--------+--------+--------+           PSV cm/sEDV cm/sStenosisDescribeComments +----------+--------+--------+--------+--------+--------+ CCA Prox  121     26                               +----------+--------+--------+--------+--------+--------+ CCA Distal89      17                               +----------+--------+--------+--------+--------+--------+ ICA Prox  66      17                               +----------+--------+--------+--------+--------+--------+ ICA Mid   78      27                               +----------+--------+--------+--------+--------+--------+ ICA Distal70      24                               +----------+--------+--------+--------+--------+--------+ ECA       102     15                               +----------+--------+--------+--------+--------+--------+ +----------+--------+--------+--------+------------+ SubclavianPSV cm/sEDV cm/sDescribeArm Pressure +----------+--------+--------+--------+------------+            167     18                           +----------+--------+--------+--------+------------+ +---------+--------+--+--------+--+ VertebralPSV cm/s72EDV cm/s16 +---------+--------+--+--------+--+  ABI Findings: +------------------+-----+---------+ Rt Pressure (mmHg)IndexWaveform  +------------------+-----+---------+ 122                    triphasic +------------------+-----+---------+ 147               1.11 triphasic +------------------+-----+---------+ 146               1.11 triphasic +------------------+-----+---------+ 125               0.95 Normal    +------------------+-----+---------+ +------------------+-----+---------+ Lt Pressure (mmHg)IndexWaveform  +------------------+-----+---------+ 132                    triphasic +------------------+-----+---------+ 143               1.08 triphasic +------------------+-----+---------+ 143               1.08 triphasic +------------------+-----+---------+ 125               0.95 Normal    +------------------+-----+---------+  Right Doppler Findings: +--------+--------+---------+ Site    PressureDoppler   +--------+--------+---------+  [id number]     triphasic +--------+--------+---------+  Left Doppler Findings: +--------+--------+---------+ Site    PressureDoppler   +--------+--------+---------+ [id number]     triphasic +--------+--------+---------+   Summary: Right Carotid: Velocities in the right ICA are consistent with a 1-39% stenosis.                The ECA appears <50% stenosed. The extracranial vessels were                near-normal with only minimal wall thickening or plaque. Left Carotid: Velocities in the left ICA are consistent with a 1-39% stenosis.               The ECA appears <50% stenosed. The extracranial vessels were               near-normal with only minimal wall thickening or plaque. Vertebrals:  Bilateral vertebral arteries demonstrate antegrade flow. Subclavians: Normal flow  hemodynamics were seen in bilateral subclavian              arteries. Right ABI: Resting right ankle-brachial index is within normal range. The right toe-brachial index is normal. Left ABI: Resting left ankle-brachial index is within normal range. The left toe-brachial index is normal.    Preliminary    ECHOCARDIOGRAM COMPLETE  Result Date: 02/17/2023    ECHOCARDIOGRAM REPORT   Patient Name:   Frederick Pace. Date of Exam: 02/17/2023 Medical Rec #:  [id number]            Height:       64.0 in Accession #:    [id number]           Weight:       183.0 lb Date of Birth:  22-May-1950             BSA:          1.884 m Patient Age:    72 years             BP:           120/75 mmHg Patient Gender: M                    HR:           64 bpm. Exam Location:  Inpatient Procedure: 2D Echo, 3D Echo, Cardiac Doppler, Color Doppler and Strain Analysis Indications:    CAD Native Vessel  History:        Patient has prior history of Echocardiogram examinations, most                 recent 02/21/2022. Risk Factors:Hypertension and Dyslipidemia.  Sonographer:    Karma Ganja Referring Phys: [id number] BRYAN K BARTLE  Sonographer Comments: Global longitudinal strain was attempted. IMPRESSIONS  1. Left ventricular ejection fraction, by estimation, is 60 to 65%. The left ventricle has normal function. The left ventricle has no regional wall motion abnormalities. There is mild concentric left ventricular hypertrophy. Left ventricular diastolic parameters are consistent with Grade I diastolic dysfunction (impaired relaxation). The average left ventricular global longitudinal strain is -16.3 %. The global longitudinal strain is abnormal.  2. Right ventricular systolic function is normal. The right ventricular size is normal. There is normal pulmonary artery systolic pressure. The estimated right ventricular systolic pressure is 23.1 mmHg.  3. The mitral valve is normal in structure. No evidence of mitral valve regurgitation. No evidence of  mitral stenosis.  4. The aortic valve is tricuspid. There is moderate calcification of  the aortic valve. Aortic valve regurgitation is mild. Aortic valve sclerosis/calcification is present, without any evidence of aortic stenosis.  5. Aortic dilatation noted. There is mild dilatation of the aortic root, measuring 41 mm.  6. The inferior vena cava is normal in size with greater than 50% respiratory variability, suggesting right atrial pressure of 3 mmHg. FINDINGS  Left Ventricle: Left ventricular ejection fraction, by estimation, is 60 to 65%. The left ventricle has normal function. The left ventricle has no regional wall motion abnormalities. The average left ventricular global longitudinal strain is -16.3 %. The global longitudinal strain is abnormal. The left ventricular internal cavity size was normal in size. There is mild concentric left ventricular hypertrophy. Left ventricular diastolic parameters are consistent with Grade I diastolic dysfunction (impaired relaxation). Right Ventricle: The right ventricular size is normal. No increase in right ventricular wall thickness. Right ventricular systolic function is normal. There is normal pulmonary artery systolic pressure. The tricuspid regurgitant velocity is 2.24 m/s, and  with an assumed right atrial pressure of 3 mmHg, the estimated right ventricular systolic pressure is 23.1 mmHg. Left Atrium: Left atrial size was normal in size. Right Atrium: Right atrial size was normal in size. Pericardium: There is no evidence of pericardial effusion. Mitral Valve: The mitral valve is normal in structure. No evidence of mitral valve regurgitation. No evidence of mitral valve stenosis. Tricuspid Valve: The tricuspid valve is normal in structure. Tricuspid valve regurgitation is mild. Aortic Valve: The aortic valve is tricuspid. There is moderate calcification of the aortic valve. Aortic valve regurgitation is mild. Aortic regurgitation PHT measures 324 msec. Aortic valve  sclerosis/calcification is present, without any evidence of aortic stenosis. Aortic valve mean gradient measures 3.0 mmHg. Aortic valve peak gradient measures 6.2 mmHg. Aortic valve area, by VTI measures 3.15 cm. Pulmonic Valve: The pulmonic valve was not well visualized. Pulmonic valve regurgitation is not visualized. Aorta: Aortic dilatation noted. There is mild dilatation of the aortic root, measuring 41 mm. Venous: The inferior vena cava is normal in size with greater than 50% respiratory variability, suggesting right atrial pressure of 3 mmHg. IAS/Shunts: No atrial level shunt detected by color flow Doppler.  LEFT VENTRICLE PLAX 2D LVIDd:         5.00 cm   Diastology LVIDs:         2.80 cm   LV e' medial:    6.85 cm/s LV PW:         1.10 cm   LV E/e' medial:  8.8 LV IVS:        1.40 cm   LV e' lateral:   10.70 cm/s LVOT diam:     2.20 cm   LV E/e' lateral: 5.6 LV SV:         79 LV SV Index:   42        2D Longitudinal Strain LVOT Area:     3.80 cm  2D Strain GLS Avg:     -16.3 %                           3D Volume EF:                          3D EF:        60 %  LV EDV:       131 ml                          LV ESV:       53 ml                          LV SV:        78 ml RIGHT VENTRICLE             IVC RV Basal diam:  3.50 cm     IVC diam: 1.30 cm RV S prime:     12.60 cm/s TAPSE (M-Pace): 3.0 cm LEFT ATRIUM             Index        RIGHT ATRIUM           Index LA diam:        3.50 cm 1.86 cm/m   RA Area:     10.60 cm LA Vol (A2C):   48.0 ml 25.48 ml/m  RA Volume:   21.90 ml  11.62 ml/m LA Vol (A4C):   25.1 ml 13.32 ml/m LA Biplane Vol: 35.5 ml 18.84 ml/m  AORTIC VALVE AV Area (Vmax):    3.41 cm AV Area (Vmean):   3.16 cm AV Area (VTI):     3.15 cm AV Vmax:           125.00 cm/s AV Vmean:          85.500 cm/s AV VTI:            0.250 m AV Peak Grad:      6.2 mmHg AV Mean Grad:      3.0 mmHg LVOT Vmax:         112.00 cm/s LVOT Vmean:        71.000 cm/s LVOT VTI:          0.207  m LVOT/AV VTI ratio: 0.83 AI PHT:            324 msec  AORTA Ao Root diam: 3.95 cm Ao STJ diam:  3.4 cm Ao Asc diam:  3.80 cm MITRAL VALVE               TRICUSPID VALVE MV Area (PHT): 2.50 cm    TR Peak grad:   20.1 mmHg MV Decel Time: 304 msec    TR Vmax:        224.00 cm/s MV E velocity: 60.20 cm/s MV A velocity: 96.70 cm/s  SHUNTS MV E/A ratio:  0.62        Systemic VTI:  0.21 m                            Systemic Diam: 2.20 cm Dalton McleanMD Electronically signed by Wilfred Lacy Signature Date/Time: 02/17/2023/3:48:02 PM    Final    CARDIAC CATHETERIZATION  Result Date: 02/17/2023 Conclusions: Severe three-vessel coronary artery disease, including severe distal LMCA lesion involving ostia of LAD and LCx, as detailed below. Normal left ventricular systolic function and filling pressure. Recommendations: Given severity of disease, admit for inpatient cardiac surgery consultation. Escalate rosuvastatin to 40 mg daily; continue aggressive secondary prevention of coronary artery disease. Obtain echocardiogram. Yvonne Kendall, MD [facility]  Disposition   Pt is being discharged home today in good condition.  Follow-up Plans & Appointments     Follow-up Information  Sherlene Shams, MD Follow up.   Specialty: Internal Medicine Why: GO: 02/24/2023 AT 11:00 AM Contact information: [address] [place] [address] [phone]                Discharge Instructions     Diet - low sodium heart healthy   Complete by: As directed    Discharge instructions   Complete by: As directed    Radial Site Care Refer to this sheet in the next few weeks. These instructions provide you with information on caring for yourself after your procedure. Your caregiver may also give you more specific instructions. Your treatment has been planned according to current medical practices, but problems sometimes occur. Call your caregiver if you have any problems or questions after  your procedure. HOME CARE INSTRUCTIONS You may shower the day after the procedure. Remove the bandage (dressing) and gently wash the site with plain soap and water. Gently pat the site dry.  Do not apply powder or lotion to the site.  Do not submerge the affected site in water for 3 to 5 days.  Inspect the site at least twice daily.  Do not flex or bend the affected arm for 24 hours.  No lifting over 5 pounds (2.3 kg) for 5 days after your procedure.  Do not drive home if you are discharged the same day of the procedure. Have someone else drive you.  You may drive 24 hours after the procedure unless otherwise instructed by your caregiver.  What to expect: Any bruising will usually fade within 1 to 2 weeks.  Blood that collects in the tissue (hematoma) may be painful to the touch. It should usually decrease in size and tenderness within 1 to 2 weeks.  SEEK IMMEDIATE MEDICAL CARE IF: You have unusual pain at the radial site.  You have redness, warmth, swelling, or pain at the radial site.  You have drainage (other than a small amount of blood on the dressing).  You have chills.  You have a fever or persistent symptoms for more than 72 hours.  You have a fever and your symptoms suddenly get worse.  Your arm becomes pale, cool, tingly, or numb.  You have heavy bleeding from the site. Hold pressure on the site.   Increase activity slowly   Complete by: As directed         Discharge Medications   Allergies as of 02/18/2023   No Known Allergies      Medication List     TAKE these medications    amLODipine 10 MG tablet Commonly known as: NORVASC TAKE 1 TABLET BY MOUTH DAILY   aspirin EC 81 MG tablet Take 1 tablet (81 mg total) by mouth daily. Swallow whole.   losartan 100 MG tablet Commonly known as: COZAAR TAKE 1 TABLET BY MOUTH DAILY   metoprolol succinate 25 MG 24 hr tablet Commonly known as: TOPROL-XL Take 1 tablet (25 mg total) by mouth daily.   nitroGLYCERIN 0.4  MG SL tablet Commonly known as: NITROSTAT Place 1 tablet (0.4 mg total) under the tongue every 5 (five) minutes as needed. What changed: reasons to take this   rosuvastatin 40 MG tablet Commonly known as: CRESTOR Take 1 tablet (40 mg total) by mouth daily. Start taking on: February 19, 2023 What changed:  medication strength how much to take           Outstanding Labs/Studies   Return for surgery 12/4  Duration of Discharge Encounter  Greater than 30 minutes including physician time.  Signed, Laverda Page, NP 02/18/2023, 2:34 PM

## 2023-02-19 ENCOUNTER — Telehealth: Payer: Self-pay

## 2023-02-19 ENCOUNTER — Other Ambulatory Visit: Payer: Self-pay | Admitting: *Deleted

## 2023-02-19 LAB — LIPOPROTEIN A (LPA): Lipoprotein (a): 114 nmol/L — ABNORMAL HIGH (ref <75.0)

## 2023-02-19 NOTE — Transitions of Care (Post Inpatient/ED Visit) (Signed)
   02/19/2023  Name: Frederick Pace. MRN: [id number] DOB: 01-19-1951  Today's TOC FU Call Status: Today's TOC FU Call Status:: Unsuccessful Call (1st Attempt) Unsuccessful Call (1st Attempt) Date: 02/19/23  Attempted to reach the patient regarding the most recent Inpatient/ED visit.  Follow Up Plan: Additional outreach attempts will be made to reach the patient to complete the Transitions of Care (Post Inpatient/ED visit) call.   Signature Karena Addison, LPN [facility] Nurse Health Advisor Direct Dial [phone]

## 2023-02-19 NOTE — Transitions of Care (Post Inpatient/ED Visit) (Signed)
   02/19/2023  Name: Frederick Pace. MRN: [id number] DOB: Sep 23, 1950  Today's TOC FU Call Status: Today's TOC FU Call Status:: Successful TOC FU Call Completed Unsuccessful Call (1st Attempt) Date: 02/19/23 [facility] FU Call Complete Date: 02/19/23 Patient's Name and Date of Birth confirmed.  Transition Care Management Follow-up Telephone Call Date of Discharge: 02/18/23 Discharge Facility: [facility]) Type of Discharge: Inpatient Admission Primary Inpatient Discharge Diagnosis:: heart disease How have you been since you were released from the hospital?: Better Any questions or concerns?: No  Items Reviewed: Did you receive and understand the discharge instructions provided?: Yes Medications obtained,verified, and reconciled?: Yes (Medications Reviewed) Any new allergies since your discharge?: No Dietary orders reviewed?: Yes Do you have support at home?: No  Medications Reviewed Today: Medications Reviewed Today     Reviewed by Karena Addison, LPN (Licensed Practical Nurse) on 02/19/23 at 1142  Med List Status: <None>   Medication Order Taking? Sig Documenting Provider Last Dose Status Informant  amLODipine (NORVASC) 10 MG tablet [id number] No TAKE 1 TABLET BY MOUTH DAILY Sherlene Shams, MD 02/17/2023 Active Self, Pharmacy Records  aspirin EC 81 MG tablet [id number] No Take 1 tablet (81 mg total) by mouth daily. Swallow whole. Yvonne Kendall, MD 02/17/2023 Active Self, Pharmacy Records  losartan (COZAAR) 100 MG tablet [id number] No TAKE 1 TABLET BY MOUTH DAILY Sherlene Shams, MD 02/17/2023 Active Self, Pharmacy Records  metoprolol succinate (TOPROL-XL) 25 MG 24 hr tablet [id number] No Take 1 tablet (25 mg total) by mouth daily. Sherlene Shams, MD 02/17/2023 Active Self, Pharmacy Records  nitroGLYCERIN (NITROSTAT) 0.4 MG SL tablet [id number] No Place 1 tablet (0.4 mg total) under the tongue every 5 (five) minutes as needed.  Patient taking differently: Place 0.4 mg under the  tongue every 5 (five) minutes as needed for chest pain.   End, Cristal Deer, MD unknown Active Self, Pharmacy Records  rosuvastatin (CRESTOR) 40 MG tablet [id number]  Take 1 tablet (40 mg total) by mouth daily. Arty Baumgartner, NP  Active             Home Care and Equipment/Supplies: Were Home Health Services Ordered?: NA Any new equipment or medical supplies ordered?: NA  Functional Questionnaire: Do you need assistance with bathing/showering or dressing?: No Do you need assistance with meal preparation?: No Do you need assistance with eating?: No Do you have difficulty maintaining continence: No Do you need assistance with getting out of bed/getting out of a chair/moving?: No Do you have difficulty managing or taking your medications?: No  Follow up appointments reviewed: PCP Follow-up appointment confirmed?: Yes Date of PCP follow-up appointment?: 02/24/23 Follow[facility]up Provider[facility] Follow-up appointment confirmed?: Yes Follow-Up Specialty Provider:: cardio Do you need transportation to your follow-up appointment?: No Do you understand care options if your condition(s) worsen?: Yes-patient verbalized understanding    SIGNATURE Karena Addison, LPN [facility] Nurse Health Advisor Direct Dial [phone]

## 2023-02-24 ENCOUNTER — Telehealth: Payer: Self-pay | Admitting: Internal Medicine

## 2023-02-24 ENCOUNTER — Ambulatory Visit: Payer: Medicare Other | Admitting: Internal Medicine

## 2023-02-24 ENCOUNTER — Other Ambulatory Visit (HOSPITAL_COMMUNITY): Payer: Medicare Other

## 2023-02-24 ENCOUNTER — Encounter: Payer: Self-pay | Admitting: Internal Medicine

## 2023-02-24 VITALS — BP 122/74 | HR 91 | Ht 64.0 in | Wt 184.6 lb

## 2023-02-24 DIAGNOSIS — Z09 Encounter for follow-up examination after completed treatment for conditions other than malignant neoplasm: Secondary | ICD-10-CM | POA: Diagnosis not present

## 2023-02-24 DIAGNOSIS — I25118 Atherosclerotic heart disease of native coronary artery with other forms of angina pectoris: Secondary | ICD-10-CM

## 2023-02-24 DIAGNOSIS — E782 Mixed hyperlipidemia: Secondary | ICD-10-CM

## 2023-02-24 DIAGNOSIS — R21 Rash and other nonspecific skin eruption: Secondary | ICD-10-CM

## 2023-02-24 DIAGNOSIS — I10 Essential (primary) hypertension: Secondary | ICD-10-CM | POA: Diagnosis not present

## 2023-02-24 LAB — CBC WITH DIFFERENTIAL/PLATELET
Basophils Absolute: 0 10*3/uL (ref 0.0–0.1)
Basophils Relative: 0.2 % (ref 0.0–3.0)
Eosinophils Absolute: 0.3 10*3/uL (ref 0.0–0.7)
Eosinophils Relative: 2.4 % (ref 0.0–5.0)
HCT: 47.4 % (ref 39.0–52.0)
Hemoglobin: 15.9 g/dL (ref 13.0–17.0)
Lymphocytes Relative: 13 % (ref 12.0–46.0)
Lymphs Abs: 1.5 10*3/uL (ref 0.7–4.0)
MCHC: 33.6 g/dL (ref 30.0–36.0)
MCV: 91.1 fL (ref 78.0–100.0)
Monocytes Absolute: 1.1 10*3/uL — ABNORMAL HIGH (ref 0.1–1.0)
Monocytes Relative: 9.5 % (ref 3.0–12.0)
Neutro Abs: 8.7 10*3/uL — ABNORMAL HIGH (ref 1.4–7.7)
Neutrophils Relative %: 74.9 % (ref 43.0–77.0)
Platelets: 311 10*3/uL (ref 150.0–400.0)
RBC: 5.2 Mil/uL (ref 4.22–5.81)
RDW: 12.9 % (ref 11.5–15.5)
WBC: 11.6 10*3/uL — ABNORMAL HIGH (ref 4.0–10.5)

## 2023-02-24 LAB — COMPREHENSIVE METABOLIC PANEL
ALT: 24 U/L (ref 0–53)
AST: 17 U/L (ref 0–37)
Albumin: 5 g/dL (ref 3.5–5.2)
Alkaline Phosphatase: 75 U/L (ref 39–117)
BUN: 19 mg/dL (ref 6–23)
CO2: 25 meq/L (ref 19–32)
Calcium: 9.8 mg/dL (ref 8.4–10.5)
Chloride: 100 meq/L (ref 96–112)
Creatinine, Ser: 1.22 mg/dL (ref 0.40–1.50)
GFR: 59.94 mL/min — ABNORMAL LOW (ref >60.00)
Glucose, Bld: 113 mg/dL — ABNORMAL HIGH (ref 70–99)
Potassium: 3.8 meq/L (ref 3.5–5.1)
Sodium: 136 meq/L (ref 135–145)
Total Bilirubin: 1 mg/dL (ref 0.2–1.2)
Total Protein: 8.4 g/dL — ABNORMAL HIGH (ref 6.0–8.3)

## 2023-02-24 LAB — LDL CHOLESTEROL, DIRECT: Direct LDL: 61 mg/dL

## 2023-02-24 MED ORDER — METOPROLOL SUCCINATE ER 25 MG PO TB24: 25.0000 mg | ORAL_TABLET | Freq: Every day | ORAL | 1 refills | Status: DC

## 2023-02-24 MED ORDER — AMLODIPINE BESYLATE 10 MG PO TABS: 10.0000 mg | ORAL_TABLET | Freq: Every day | ORAL | 2 refills | Status: DC

## 2023-02-24 MED ORDER — ATORVASTATIN CALCIUM 80 MG PO TABS: 80.0000 mg | ORAL_TABLET | Freq: Every day | ORAL | 3 refills | Status: DC

## 2023-02-24 NOTE — Assessment & Plan Note (Addendum)
Well controlled on current regimen. Renal function for repeat assessment post cardiac cath.  no changes today.   Lab Results  Component Value Date   CREATININE 1.22 02/24/2023

## 2023-02-24 NOTE — Assessment & Plan Note (Signed)
Attributed to Crestor, started less than 3 weeks ago. .   Advised to use antihistamine and changing statin to Lipitor   Lab Results  Component Value Date   LDLCALC 81 02/17/2023

## 2023-02-24 NOTE — Telephone Encounter (Signed)
I was contacted by Dr. Darrick Huntsman today that Frederick Pace has cancelled his CABG scheduled for 02/26/2023 so that he can take care of some "loose ends."  I spoke with him on the phone and reiterated the seriousness of his coronary artery disease and potential for major adverse cardiac events, including MI, arrhythmia, and death.  He acknowledges these risks but states that he is feeling okay and needs to take care of some of his personal affairs before moving forward with surgery.  I asked that he reach out to Dr. Sunday Corn office as soon as he has an idea of when his loose ends will be tied up so that he can move forward with CABG as soon as possible.  Yvonne Kendall, MD [facility]

## 2023-02-24 NOTE — Assessment & Plan Note (Signed)
Patient is stable post discharge and has no new issues or questions about discharge plans at the visit today for hospital follow up. All labs , imaging studies and progress notes from admission were reviewed with patient today   

## 2023-02-24 NOTE — Patient Instructions (Addendum)
Your rash may be due to crestor   Increase your antihistamine  to up to every 6 hours if needed for itching   Changing  Crestor to Lipitor   Stop lipitor if you develop any tongue or lip swelling and call us immediately   We will deal with the hernia once your heart is dealt withg

## 2023-02-24 NOTE — Progress Notes (Signed)
Subjective:  Patient ID: Frederick Pace., male    DOB: 09-05-1950  Age: 72 y.o. MRN: [id number]  CC: The primary encounter diagnosis was Diffuse papular rash. Diagnoses of White coat syndrome with hypertension, Mixed hyperlipidemia, Coronary artery disease involving native coronary artery of native heart with other form of angina pectoris (HCC), Rash of body, and Hospital discharge follow-up were also pertinent to this visit.   HPI Frederick Pace. presents for  Chief Complaint  Patient presents with   Hospitalization Follow-up    Frederick Pace is a 72 yr old white male who was recently admitted to [facility] on Nov 25 after a  diagnostic cardiac catheterization was done to investigate  3 month history of accelerated angina. (Presented with throat tightness during exertion )_)   He was found to have severe 3v CAD including severe dLM, along with LAD and LCx. He was admitted with recommendations for TCTS consult. Seen by Dr Dorris Fetch with option to undergo inpatient CABG. Also seen by Dr. Excell Seltzer who again recommended that he remain inpatient for surgery but the patient remained insistent on going home and returning for surgery.   He continues to defer admission for CABG in order "tie up loose ends"     (needs a will,  car needs repairs,  dog needs updating of vaccines so she can be boarded)      He has developed a  new onset diffuse generalized pruritic  rash affecting his torso,  left arm , right hip, and left leg.  First noticed on dec 1  .   No facial rash,  no lip swelling.  New meds: crestor and asa    Left inguinal hernia   new .   Cath was done  via right radial   artery   He continues to have exertional angina with walking.  Carries the XL NITG but has not had to use it.       Outpatient Medications Prior to Visit  Medication Sig Dispense Refill   aspirin EC 81 MG tablet Take 1 tablet (81 mg total) by mouth daily. Swallow whole.     losartan (COZAAR) 100 MG tablet TAKE 1 TABLET BY  MOUTH DAILY 90 tablet 1   nitroGLYCERIN (NITROSTAT) 0.4 MG SL tablet Place 1 tablet (0.4 mg total) under the tongue every 5 (five) minutes as needed. (Patient taking differently: Place 0.4 mg under the tongue every 5 (five) minutes as needed for chest pain.) 25 tablet 3   amLODipine (NORVASC) 10 MG tablet TAKE 1 TABLET BY MOUTH DAILY 90 tablet 2   metoprolol succinate (TOPROL-XL) 25 MG 24 hr tablet Take 1 tablet (25 mg total) by mouth daily. 90 tablet 1   rosuvastatin (CRESTOR) 40 MG tablet Take 1 tablet (40 mg total) by mouth daily. 90 tablet 1   No facility-administered medications prior to visit.    Review of Systems;  Patient denies headache, fevers, malaise, unintentional weight loss, skin rash, eye pain, sinus congestion and sinus pain, sore throat, dysphagia,  hemoptysis , cough, dyspnea, wheezing, chest pain, palpitations, orthopnea, edema, abdominal pain, nausea, melena, diarrhea, constipation, flank pain, dysuria, hematuria, urinary  Frequency, nocturia, numbness, tingling, seizures,  Focal weakness, Loss of consciousness,  Tremor, insomnia, depression, anxiety, and suicidal ideation.      Objective:  BP 122/74   Pulse 91   Ht 5\' 4"  (1.626 m)   Wt 184 lb 9.6 oz (83.7 kg)   SpO2 96%   BMI 31.69 kg/m  BP Readings from Last 3 Encounters:  02/24/23 122/74  02/18/23 121/74  02/06/23 136/78    Wt Readings from Last 3 Encounters:  02/24/23 184 lb 9.6 oz (83.7 kg)  02/18/23 186 lb (84.4 kg)  02/06/23 189 lb 6.4 oz (85.9 kg)    Physical Exam  Lab Results  Component Value Date   HGBA1C 5.6 02/18/2023   HGBA1C 5.9 07/12/2021   HGBA1C 5.7 06/09/2020    Lab Results  Component Value Date   CREATININE 1.22 02/24/2023   CREATININE 1.06 02/18/2023   CREATININE 1.12 02/06/2023    Lab Results  Component Value Date   WBC 11.6 (H) 02/24/2023   HGB 15.9 02/24/2023   HCT 47.4 02/24/2023   PLT 311.0 02/24/2023   GLUCOSE 113 (H) 02/24/2023   CHOL 136 02/17/2023   TRIG  121 02/17/2023   HDL 31 (L) 02/17/2023   LDLDIRECT 61.0 02/24/2023   LDLCALC 81 02/17/2023   ALT 24 02/24/2023   AST 17 02/24/2023   NA 136 02/24/2023   K 3.8 02/24/2023   CL 100 02/24/2023   CREATININE 1.22 02/24/2023   BUN 19 02/24/2023   CO2 25 02/24/2023   TSH 2.65 09/12/2022   PSA 0.36 09/12/2022   HGBA1C 5.6 02/18/2023   MICROALBUR 2.5 (H) 09/12/2022    VAS US DOPPLER PRE CABG  Result Date: 02/18/2023 PREOPERATIVE VASCULAR EVALUATION Patient Name:  Frederick Pace.  Date of Exam:   02/18/2023 Medical Rec #: [id number]             Accession #:    [id number] Date of Birth: 07/01/1950              Patient Gender: M Patient Age:   72 years Exam Location:  [facility] Procedure:      VAS US DOPPLER PRE CABG Referring Phys: Evelene Croon --------------------------------------------------------------------------------  Indications:      Pre-CABG and Coronary artery calcification. Risk Factors:     Hypertension, hyperlipidemia, coronary artery disease. Comparison Study: No prior exam. Performing Technologist: San Jetty  Examination Guidelines: A complete evaluation includes B-mode imaging, spectral Doppler, color Doppler, and power Doppler as needed of all accessible portions of each vessel. Bilateral testing is considered an integral part of a complete examination. Limited examinations for reoccurring indications may be performed as noted.  Right Carotid Findings: +----------+--------+--------+--------+--------+--------+           PSV cm/sEDV cm/sStenosisDescribeComments +----------+--------+--------+--------+--------+--------+ CCA Prox  127     20                               +----------+--------+--------+--------+--------+--------+ CCA Distal107     23                               +----------+--------+--------+--------+--------+--------+ ICA Prox  64      13                               +----------+--------+--------+--------+--------+--------+ ICA Mid    82      24                               +----------+--------+--------+--------+--------+--------+ ICA Distal113     31                               +----------+--------+--------+--------+--------+--------+  ECA       150     16                               +----------+--------+--------+--------+--------+--------+ +----------+--------+-------+--------+------------+           PSV cm/sEDV cmsDescribeArm Pressure +----------+--------+-------+--------+------------+ Subclavian153     12                          +----------+--------+-------+--------+------------+ +---------+--------+--+--------+--+ VertebralPSV cm/s29EDV cm/s11 +---------+--------+--+--------+--+ Left Carotid Findings: +----------+--------+--------+--------+--------+--------+           PSV cm/sEDV cm/sStenosisDescribeComments +----------+--------+--------+--------+--------+--------+ CCA Prox  121     26                               +----------+--------+--------+--------+--------+--------+ CCA Distal89      17                               +----------+--------+--------+--------+--------+--------+ ICA Prox  66      17                               +----------+--------+--------+--------+--------+--------+ ICA Mid   78      27                               +----------+--------+--------+--------+--------+--------+ ICA Distal70      24                               +----------+--------+--------+--------+--------+--------+ ECA       102     15                               +----------+--------+--------+--------+--------+--------+ +----------+--------+--------+--------+------------+ SubclavianPSV cm/sEDV cm/sDescribeArm Pressure +----------+--------+--------+--------+------------+           167     18                           +----------+--------+--------+--------+------------+ +---------+--------+--+--------+--+ VertebralPSV cm/s72EDV cm/s16  +---------+--------+--+--------+--+  ABI Findings: +------------------+-----+---------+ Rt Pressure (mmHg)IndexWaveform  +------------------+-----+---------+ 122                    triphasic +------------------+-----+---------+ 147               1.11 triphasic +------------------+-----+---------+ 146               1.11 triphasic +------------------+-----+---------+ 125               0.95 Normal    +------------------+-----+---------+ +------------------+-----+---------+ Lt Pressure (mmHg)IndexWaveform  +------------------+-----+---------+ 132                    triphasic +------------------+-----+---------+ 143               1.08 triphasic +------------------+-----+---------+ 143               1.08 triphasic +------------------+-----+---------+ 125               0.95 Normal    +------------------+-----+---------+  Right Doppler Findings: +--------+--------+---------+ Site    PressureDoppler   +--------+--------+---------+  [id number]     triphasic +--------+--------+---------+  Left Doppler Findings: +--------+--------+---------+ Site    PressureDoppler   +--------+--------+---------+ [id number]     triphasic +--------+--------+---------+   Summary: Right Carotid: Velocities in the right ICA are consistent with a 1-39% stenosis.                The ECA appears <50% stenosed. The extracranial vessels were                near-normal with only minimal wall thickening or plaque. Left Carotid: Velocities in the left ICA are consistent with a 1-39% stenosis.               The ECA appears <50% stenosed. The extracranial vessels were               near-normal with only minimal wall thickening or plaque. Vertebrals:  Bilateral vertebral arteries demonstrate antegrade flow. Subclavians: Normal flow hemodynamics were seen in bilateral subclavian              arteries. Right ABI: Resting right ankle-brachial index is within normal range. The right toe-brachial index is  normal. Left ABI: Resting left ankle-brachial index is within normal range. The left toe-brachial index is normal.  Electronically signed by Lemar Livings MD on 02/18/2023 at 2:34:19 PM.    Final    ECHOCARDIOGRAM COMPLETE  Result Date: 02/17/2023    ECHOCARDIOGRAM REPORT   Patient Name:   Frederick Vaziri. Date of Exam: 02/17/2023 Medical Rec #:  [id number]            Height:       64.0 in Accession #:    [id number]           Weight:       183.0 lb Date of Birth:  Oct 28, 1950             BSA:          1.884 m Patient Age:    72 years             BP:           120/75 mmHg Patient Gender: M                    HR:           64 bpm. Exam Location:  Inpatient Procedure: 2D Echo, 3D Echo, Cardiac Doppler, Color Doppler and Strain Analysis Indications:    CAD Native Vessel  History:        Patient has prior history of Echocardiogram examinations, most                 recent 02/21/2022. Risk Factors:Hypertension and Dyslipidemia.  Sonographer:    Karma Ganja Referring Phys: [id number] BRYAN K BARTLE  Sonographer Comments: Global longitudinal strain was attempted. IMPRESSIONS  1. Left ventricular ejection fraction, by estimation, is 60 to 65%. The left ventricle has normal function. The left ventricle has no regional wall motion abnormalities. There is mild concentric left ventricular hypertrophy. Left ventricular diastolic parameters are consistent with Grade I diastolic dysfunction (impaired relaxation). The average left ventricular global longitudinal strain is -16.3 %. The global longitudinal strain is abnormal.  2. Right ventricular systolic function is normal. The right ventricular size is normal. There is normal pulmonary artery systolic pressure. The estimated right ventricular systolic pressure is 23.1 mmHg.  3. The mitral valve is normal in structure. No evidence of mitral valve regurgitation. No evidence of mitral stenosis.  4. The aortic valve is tricuspid. There is moderate calcification of the aortic valve.  Aortic valve regurgitation is mild. Aortic valve sclerosis/calcification is present, without any evidence of aortic stenosis.  5. Aortic dilatation noted. There is mild dilatation of the aortic root, measuring 41 mm.  6. The inferior vena cava is normal in size with greater than 50% respiratory variability, suggesting right atrial pressure of 3 mmHg. FINDINGS  Left Ventricle: Left ventricular ejection fraction, by estimation, is 60 to 65%. The left ventricle has normal function. The left ventricle has no regional wall motion abnormalities. The average left ventricular global longitudinal strain is -16.3 %. The global longitudinal strain is abnormal. The left ventricular internal cavity size was normal in size. There is mild concentric left ventricular hypertrophy. Left ventricular diastolic parameters are consistent with Grade I diastolic dysfunction (impaired relaxation). Right Ventricle: The right ventricular size is normal. No increase in right ventricular wall thickness. Right ventricular systolic function is normal. There is normal pulmonary artery systolic pressure. The tricuspid regurgitant velocity is 2.24 m/s, and  with an assumed right atrial pressure of 3 mmHg, the estimated right ventricular systolic pressure is 23.1 mmHg. Left Atrium: Left atrial size was normal in size. Right Atrium: Right atrial size was normal in size. Pericardium: There is no evidence of pericardial effusion. Mitral Valve: The mitral valve is normal in structure. No evidence of mitral valve regurgitation. No evidence of mitral valve stenosis. Tricuspid Valve: The tricuspid valve is normal in structure. Tricuspid valve regurgitation is mild. Aortic Valve: The aortic valve is tricuspid. There is moderate calcification of the aortic valve. Aortic valve regurgitation is mild. Aortic regurgitation PHT measures 324 msec. Aortic valve sclerosis/calcification is present, without any evidence of aortic stenosis. Aortic valve mean gradient  measures 3.0 mmHg. Aortic valve peak gradient measures 6.2 mmHg. Aortic valve area, by VTI measures 3.15 cm. Pulmonic Valve: The pulmonic valve was not well visualized. Pulmonic valve regurgitation is not visualized. Aorta: Aortic dilatation noted. There is mild dilatation of the aortic root, measuring 41 mm. Venous: The inferior vena cava is normal in size with greater than 50% respiratory variability, suggesting right atrial pressure of 3 mmHg. IAS/Shunts: No atrial level shunt detected by color flow Doppler.  LEFT VENTRICLE PLAX 2D LVIDd:         5.00 cm   Diastology LVIDs:         2.80 cm   LV e' medial:    6.85 cm/s LV PW:         1.10 cm   LV E/e' medial:  8.8 LV IVS:        1.40 cm   LV e' lateral:   10.70 cm/s LVOT diam:     2.20 cm   LV E/e' lateral: 5.6 LV SV:         79 LV SV Index:   42        2D Longitudinal Strain LVOT Area:     3.80 cm  2D Strain GLS Avg:     -16.3 %                           3D Volume EF:                          3D EF:        60 %  LV EDV:       131 ml                          LV ESV:       53 ml                          LV SV:        78 ml RIGHT VENTRICLE             IVC RV Basal diam:  3.50 cm     IVC diam: 1.30 cm RV S prime:     12.60 cm/s TAPSE (M-mode): 3.0 cm LEFT ATRIUM             Index        RIGHT ATRIUM           Index LA diam:        3.50 cm 1.86 cm/m   RA Area:     10.60 cm LA Vol (A2C):   48.0 ml 25.48 ml/m  RA Volume:   21.90 ml  11.62 ml/m LA Vol (A4C):   25.1 ml 13.32 ml/m LA Biplane Vol: 35.5 ml 18.84 ml/m  AORTIC VALVE AV Area (Vmax):    3.41 cm AV Area (Vmean):   3.16 cm AV Area (VTI):     3.15 cm AV Vmax:           125.00 cm/s AV Vmean:          85.500 cm/s AV VTI:            0.250 m AV Peak Grad:      6.2 mmHg AV Mean Grad:      3.0 mmHg LVOT Vmax:         112.00 cm/s LVOT Vmean:        71.000 cm/s LVOT VTI:          0.207 m LVOT/AV VTI ratio: 0.83 AI PHT:            324 msec  AORTA Ao Root diam: 3.95 cm Ao STJ diam:  3.4 cm  Ao Asc diam:  3.80 cm MITRAL VALVE               TRICUSPID VALVE MV Area (PHT): 2.50 cm    TR Peak grad:   20.1 mmHg MV Decel Time: 304 msec    TR Vmax:        224.00 cm/s MV E velocity: 60.20 cm/s MV A velocity: 96.70 cm/s  SHUNTS MV E/A ratio:  0.62        Systemic VTI:  0.21 m                            Systemic Diam: 2.20 cm Dalton McleanMD Electronically signed by Wilfred Lacy Signature Date/Time: 02/17/2023/3:48:02 PM    Final    CARDIAC CATHETERIZATION  Result Date: 02/17/2023 Conclusions: Severe three-vessel coronary artery disease, including severe distal LMCA lesion involving ostia of LAD and LCx, as detailed below. Normal left ventricular systolic function and filling pressure. Recommendations: Given severity of disease, admit for inpatient cardiac surgery consultation. Escalate rosuvastatin to 40 mg daily; continue aggressive secondary prevention of coronary artery disease. Obtain echocardiogram. Yvonne Kendall, MD [facility]   Assessment & Plan:  .Diffuse papular rash -     Comprehensive metabolic panel -     CBC with  Differential/Platelet  White coat syndrome with hypertension Assessment & Plan: Well controlled on current regimen. Renal function for repeat assessment post cardiac cath.  no changes today.   Lab Results  Component Value Date   CREATININE 1.22 02/24/2023      Mixed hyperlipidemia -     LDL cholesterol, direct  Coronary artery disease involving native coronary artery of native heart with other form of angina pectoris [facility]) Assessment & Plan: He has postponed his surgery due to "loose ends at home" against all medical advice.  . He has been advised of strict return precautions    Rash of body Assessment & Plan: Attributed to Crestor, started less than 3 weeks ago. .   Advised to use antihistamine and changing statin to Lipitor   Lab Results  Component Value Date   [facility] 81 02/17/2023      Hospital discharge follow-up Assessment &  Plan: Patient is stable post discharge and has no new issues or questions about discharge plans at the visit today for hospital follow up. All labs , imaging studies and progress notes from admission were reviewed with patient today      Other orders -     amLODIPine Besylate; Take 1 tablet (10 mg total) by mouth daily.  Dispense: 90 tablet; Refill: 2 -     Metoprolol Succinate ER; Take 1 tablet (25 mg total) by mouth daily.  Dispense: 90 tablet; Refill: 1 -     Atorvastatin Calcium; Take 1 tablet (80 mg total) by mouth daily.  Dispense: 90 tablet; Refill: 3     I provided 30 minutes of face-to-face time during this encounter reviewing patient's last visit with me, patient's  most recent visit with cardiology,  nephrology,  and neurology,  recent surgical and non surgical procedures, previous  labs and imaging studies, counseling on currently addressed issues,  and post visit ordering to diagnostics and therapeutics .   Follow-up: No follow-ups on file.   Sherlene Shams, MD

## 2023-02-24 NOTE — Assessment & Plan Note (Signed)
He has postponed his surgery due to "loose ends at home" against all medical advice.  . He has been advised of strict return precautions

## 2023-02-26 ENCOUNTER — Inpatient Hospital Stay (HOSPITAL_COMMUNITY)
Admission: RE | Admit: 2023-02-26 | Payer: Medicare Other | Source: Home / Self Care | Admitting: Thoracic Surgery (Cardiothoracic Vascular Surgery)

## 2023-02-26 ENCOUNTER — Encounter (HOSPITAL_COMMUNITY): Admission: RE | Payer: Self-pay | Source: Home / Self Care

## 2023-02-26 ENCOUNTER — Telehealth: Payer: Self-pay

## 2023-02-26 SURGERY — CORONARY ARTERY BYPASS GRAFTING (CABG)
Anesthesia: General | Site: Chest

## 2023-02-26 NOTE — Telephone Encounter (Signed)
noted 

## 2023-02-26 NOTE — Telephone Encounter (Signed)
Pt called back and I read the message and he verbalized understanding

## 2023-02-26 NOTE — Telephone Encounter (Signed)
LMTCB in regards to lab results.  

## 2023-02-26 NOTE — Telephone Encounter (Signed)
-----   Message from Sherlene Shams sent at 02/25/2023  8:57 PM EST ----- Kidney function is slightly off since the catheterization . Increase fluids to flush kidneys.   Cholesterol is much lower since starting medication

## 2023-03-12 ENCOUNTER — Encounter: Payer: Self-pay | Admitting: Internal Medicine

## 2023-03-12 ENCOUNTER — Ambulatory Visit: Payer: Medicare Other | Attending: Internal Medicine | Admitting: Internal Medicine

## 2023-03-12 VITALS — BP 120/72 | HR 83 | Ht 64.0 in | Wt 187.0 lb

## 2023-03-12 DIAGNOSIS — E785 Hyperlipidemia, unspecified: Secondary | ICD-10-CM

## 2023-03-12 DIAGNOSIS — I351 Nonrheumatic aortic (valve) insufficiency: Secondary | ICD-10-CM | POA: Insufficient documentation

## 2023-03-12 DIAGNOSIS — I25118 Atherosclerotic heart disease of native coronary artery with other forms of angina pectoris: Secondary | ICD-10-CM | POA: Diagnosis not present

## 2023-03-12 DIAGNOSIS — T466X5A Adverse effect of antihyperlipidemic and antiarteriosclerotic drugs, initial encounter: Secondary | ICD-10-CM | POA: Diagnosis not present

## 2023-03-12 DIAGNOSIS — I10 Essential (primary) hypertension: Secondary | ICD-10-CM

## 2023-03-12 DIAGNOSIS — G720 Drug-induced myopathy: Secondary | ICD-10-CM | POA: Diagnosis not present

## 2023-03-12 MED ORDER — REPATHA SURECLICK 140 MG/ML ~~LOC~~ SOAJ: 140.0000 mg | SUBCUTANEOUS | 3 refills | Status: DC

## 2023-03-12 NOTE — Patient Instructions (Addendum)
Medication Instructions:  Your physician recommends the following medication changes.  START TAKING: Repatha 140 mg:  inject into the skin every 14 days.   *If you need a refill on your cardiac medications before your next appointment, please call your pharmacy*   Lab Work: No labs ordered today    Testing/Procedures: No test ordered today    Follow-Up: At [facility], you and your health needs are our priority.  As part of our continuing mission to provide you with exceptional heart care, we have created designated Provider Care Teams.  These Care Teams include your primary Cardiologist (physician) and Advanced Practice Providers (APPs -  Physician Assistants and Nurse Practitioners) who all work together to provide you with the care you need, when you need it.  We recommend signing up for the patient portal called "MyChart".  Sign up information is provided on this After Visit Summary.  MyChart is used to connect with patients for Virtual Visits (Telemedicine).  Patients are able to view lab/test results, encounter notes, upcoming appointments, etc.  Non-urgent messages can be sent to your provider as well.   To learn more about what you can do with MyChart, go to [email or web address].    Your next appointment:   2 month(s)  Provider:   You may see Yvonne Kendall, MD or one of the following Advanced Practice Providers on your designated Care Team:   Nicolasa Ducking, NP Eula Listen, PA-C Cadence Fransico Michael, PA-C Charlsie Quest, NP Carlos Levering, NP

## 2023-03-12 NOTE — Progress Notes (Signed)
Cardiology Office Note:  .   Date:  03/12/2023  ID:  Frederick Harbor., DOB [birth year], MRN [id number] PCP: Sherlene Shams, MD  [facility] Providers Cardiologist:  Yvonne Kendall, MD     History of Present Illness: [name]   Frederick Blank Hurl Ro. is a 72 y.o. male with history of coronary artery calcification (CAC 1066), hypertension, hyperlipidemia, and solitary kidney, who presents for follow-up of coronary artery disease.  I saw him a month ago, which time he complained of escalating exertional chest pain.  He underwent catheterization on 02/17/2023 and was found to have severe three-vessel disease, including high-grade stenosis of the distal LMCA extending into the LAD and LCx as well as CTO of the RCA.  He was admitted for inpatient surgical evaluation and was scheduled for CABG that week.  However, he elected to be discharged and has yet to follow-up with cardiac surgery.  Today, Frederick Pace reports that he has been feeling fairly well.  He notes some exertional dyspnea and mild tightness in the chest if he walks briskly for extended distances.  He has not had any chest pain at rest or with normal ADLs.  He previously stopped rosuvastatin due to a rash and was switched to atorvastatin.  He stopped this a few days ago as well due to back pain that has since resolved.  He is planning to move forward with CABG in early January and has already reached out to Dr. Sunday Corn office.  ROS: See HPI  Studies Reviewed: [name]   EKG Interpretation Date/Time:  Wednesday March 12 2023 09:30:16 EST Ventricular Rate:  83 PR Interval:  156 QRS Duration:  140 QT Interval:  386 QTC Calculation: 453 R Axis:   133  Text Interpretation: Normal sinus rhythm Right axis deviation Right bundle branch block Abnormal ECG When compared with ECG of 06-Feb-2023 09:15, PR interval has decreased Otherwise no significant change Confirmed by Guillermo Difrancesco [phone]) on 03/12/2023 11:13:02 AM    TTE  (02/17/2023):  1. Left ventricular ejection fraction, by estimation, is 60 to 65%. The  left ventricle has normal function. The left ventricle has no regional  wall motion abnormalities. There is mild concentric left ventricular  hypertrophy. Left ventricular diastolic  parameters are consistent with Grade I diastolic dysfunction (impaired  relaxation). The average left ventricular global longitudinal strain is  -16.3 %. The global longitudinal strain is abnormal.   2. Right ventricular systolic function is normal. The right ventricular  size is normal. There is normal pulmonary artery systolic pressure. The  estimated right ventricular systolic pressure is 23.1 mmHg.   3. The mitral valve is normal in structure. No evidence of mitral valve  regurgitation. No evidence of mitral stenosis.   4. The aortic valve is tricuspid. There is moderate calcification of the  aortic valve. Aortic valve regurgitation is mild. Aortic valve  sclerosis/calcification is present, without any evidence of aortic  stenosis.   5. Aortic dilatation noted. There is mild dilatation of the aortic root,  measuring 41 mm.   6. The inferior vena cava is normal in size with greater than 50%  respiratory variability, suggesting right atrial pressure of 3 mmHg.   LHC (02/17/2023): Diagnostic Dominance: Right   Risk Assessment/Calculations:             Physical Exam:   VS:  BP 120/72 (BP Location: Left Arm, Patient Position: Sitting, Cuff Size: Normal)   Pulse 83   Ht 5\' 4"  (1.626 m)  Wt 187 lb (84.8 kg)   SpO2 97%   BMI 32.10 kg/m    Wt Readings from Last 3 Encounters:  03/12/23 187 lb (84.8 kg)  02/24/23 184 lb 9.6 oz (83.7 kg)  02/18/23 186 lb (84.4 kg)    General:  NAD. Neck: No JVD or HJR. Lungs: Clear to auscultation bilaterally without wheezes or crackles. Heart: Regular rate and rhythm without murmurs, rubs, or gallops. Abdomen: Soft, nontender, nondistended. Extremities: No lower extremity  edema.  Right radial arteriotomy is well-healed.  ASSESSMENT AND PLAN: .    Coronary artery disease with stable angina: Overall, Frederick Pace feels about the same as at our last visit with some exertional dyspnea and chest tightness when walking briskly.  Catheterization last month showed severe CAD as outlined above.  Frederick Pace did not wish to proceed with CABG at that time but is now hopeful to move forward in early January.  I advised him to seek immediate medical attention for any progression of his symptoms.  Continue current regimen of amlodipine and metoprolol for antianginal therapy as well as aspirin 81 mg daily.  Given intolerance to rosuvastatin and atorvastatin, we agreed to initiate evolocumab.  I will also reach out to Dr. Sunday Corn office to see if they can help facilitate scheduling CABG for early January.  Aortic regurgitation: Mild AI noted on recent echo.  No evidence of heart failure.  EF normal on echo.  Continue clinical follow-up.  Hypertension: Blood pressure well-controlled today.  Continue current regimen of amlodipine, losartan, and metoprolol succinate.  Hyperlipidemia and statin myopathy: Lipids reasonably well-controlled on last check, though Frederick Pace has since stopped taking atorvastatin due to myalgias.  He also did not tolerate rosuvastatin due to rash.  We have discussed further treatment options and have agreed to try evolocumab 140 mg subcutaneously every 2 weeks instead.  He will need a follow-up lipid panel and LFTs in about 2 to 3 months.   Dispo: Return to clinic in early February, 2025.  Signed, Yvonne Kendall, MD

## 2023-03-14 ENCOUNTER — Telehealth: Payer: Self-pay | Admitting: Pharmacy Technician

## 2023-03-14 ENCOUNTER — Other Ambulatory Visit (HOSPITAL_COMMUNITY): Payer: Self-pay

## 2023-03-14 NOTE — Telephone Encounter (Signed)
Pharmacy Patient Advocate Encounter   Received notification from CoverMyMeds that prior authorization for repatha is required/requested.   Insurance verification completed.   The patient is insured through [facility] .   Per test claim: PA required; PA submitted to above mentioned insurance via CoverMyMeds Key/confirmation #/EOC [employer] Status is pending

## 2023-03-14 NOTE — Telephone Encounter (Signed)
Pharmacy Patient Advocate Encounter  Received notification from [facility] that Prior Authorization for repatha has been APPROVED from 03/14/23 to 09/12/23   PA #/Case ID/Reference #: [id number]

## 2023-05-07 ENCOUNTER — Other Ambulatory Visit: Payer: Self-pay | Admitting: *Deleted

## 2023-05-07 ENCOUNTER — Ambulatory Visit: Payer: Medicare Other | Admitting: Thoracic Surgery (Cardiothoracic Vascular Surgery)

## 2023-05-07 VITALS — BP 159/82 | HR 83 | Resp 18 | Ht 64.0 in | Wt 193.0 lb

## 2023-05-07 DIAGNOSIS — I2510 Atherosclerotic heart disease of native coronary artery without angina pectoris: Secondary | ICD-10-CM | POA: Diagnosis not present

## 2023-05-07 NOTE — H&P (View-Only) (Signed)
 [address]       [place] [address]             [phone]     HPI: Frederick Pace returns to discuss bypass grafting for his left main and three-vessel coronary disease.  Frederick Pace is a 73 year old man with a history of hypertension, hyperlipidemia, mild aortic insufficiency, left main and three-vessel coronary disease, right bundle branch block, exertional angina, single kidney, renal artery stenosis, renovascular hypertension, cervical disc disease, and arthritis.  He was admitted to the hospital in November.  He had a CT for coronary calcium scoring which showed a score of 1066 (86 percentile).  He was having intermittent chest tightness which radiated to his throat and shortness of breath with exertion.  He was not having any rest symptoms.  He underwent cardiac catheterization which showed an 80 to 90% distal left main stenosis with chronic total occlusion of his RCA.  He was advised to undergo coronary artery bypass grafting.  However, he decided to go home without having the procedure.  We have called him several times and he finally agreed to come in and further discuss surgery.  Continues to have some shortness of breath and chest tightness with exertion.  Relieved with rest.  Has not used nitroglycerin.  Complains of some swelling and pain in his hands.  Is also been having some back pain.  Past Medical History:  Diagnosis Date   Aortic regurgitation    HTN (hypertension)    Mitral regurgitation    Other and unspecified hyperlipidemia    Past Surgical History:  Procedure Laterality Date   ANTERIOR CRUCIATE LIGAMENT REPAIR Left 2004   LEFT HEART CATH AND CORONARY ANGIOGRAPHY N/A 02/17/2023   Procedure: LEFT HEART CATH AND CORONARY ANGIOGRAPHY;  Surgeon: Yvonne Kendall, MD;  Location: [facility] INVASIVE [facility];  Service: Cardiovascular;  Laterality: N/A;   RENAL ANGIOGRAPHY Right 01/08/2018   Procedure: RENAL ANGIOGRAPHY;  Surgeon: Annice Needy, MD;  Location:  [facility] INVASIVE CV LAB;  Service: Cardiovascular;  Laterality: Right;     Current Outpatient Medications  Medication Sig Dispense Refill   amLODipine (NORVASC) 10 MG tablet Take 1 tablet (10 mg total) by mouth daily. 90 tablet 2   aspirin EC 81 MG tablet Take 1 tablet (81 mg total) by mouth daily. Swallow whole.     Evolocumab (REPATHA SURECLICK) 140 MG/ML SOAJ Inject 140 mg into the skin every 14 (fourteen) days. 6 mL 3   losartan (COZAAR) 100 MG tablet TAKE 1 TABLET BY MOUTH DAILY 90 tablet 1   metoprolol succinate (TOPROL-XL) 25 MG 24 hr tablet Take 1 tablet (25 mg total) by mouth daily. 90 tablet 1   nitroGLYCERIN (NITROSTAT) 0.4 MG SL tablet Place 1 tablet (0.4 mg total) under the tongue every 5 (five) minutes as needed. (Patient taking differently: Place 0.4 mg under the tongue every 5 (five) minutes as needed for chest pain.) 25 tablet 3   No current facility-administered medications for this visit.    Physical Exam BP (!) 159/82 (BP Location: Left Arm)   Pulse 83   Resp 18   Ht 5\' 4"  (1.626 m)   Wt 193 lb (87.5 kg)   SpO2 97% Comment: RA  BMI 33.33 kg/m  73 year old man in no acute distress Well-developed and well-nourished Alert and oriented x 3 with no focal deficits No carotid bruits Lungs clear with equal breath sounds bilaterally Cardiac regular rate and rhythm with a faint systolic  murmur Trace edema in lower extremities   Diagnostic Tests: Echocardiogram 02/17/2023 IMPRESSIONS     1. Left ventricular ejection fraction, by estimation, is 60 to 65%. The  left ventricle has normal function. The left ventricle has no regional  wall motion abnormalities. There is mild concentric left ventricular  hypertrophy. Left ventricular diastolic  parameters are consistent with Grade I diastolic dysfunction (impaired  relaxation). The average left ventricular global longitudinal strain is  -16.3 %. The global longitudinal strain is abnormal.   2. Right ventricular systolic  function is normal. The right ventricular  size is normal. There is normal pulmonary artery systolic pressure. The  estimated right ventricular systolic pressure is 23.1 mmHg.   3. The mitral valve is normal in structure. No evidence of mitral valve  regurgitation. No evidence of mitral stenosis.   4. The aortic valve is tricuspid. There is moderate calcification of the  aortic valve. Aortic valve regurgitation is mild. Aortic valve  sclerosis/calcification is present, without any evidence of aortic  stenosis.   5. Aortic dilatation noted. There is mild dilatation of the aortic root,  measuring 41 mm.   6. The inferior vena cava is normal in size with greater than 50%  respiratory variability, suggesting right atrial pressure of 3 mmHg.   Cardiac catheterization 02/17/2023 Conclusions: Severe three-vessel coronary artery disease, including severe distal LMCA lesion involving ostia of LAD and LCx, as detailed below. Normal left ventricular systolic function and filling pressure.   Recommendations: Given severity of disease, admit for inpatient cardiac surgery consultation. Escalate rosuvastatin to 40 mg daily; continue aggressive secondary prevention of coronary artery disease. Obtain echocardiogram.   Yvonne Kendall, MD [facility] Diagnostic Dominance: Right  I personally reviewed the catheterization images.  Left main and three-vessel disease with chronic total occlusion of his RCA with left-to-right collaterals.  Preserved left ventricular function.  Impression: Frederick Pace is a 73 year old man with a history of hypertension, hyperlipidemia, mild aortic insufficiency, left main and three-vessel coronary disease, right bundle branch block, exertional angina, single kidney, renal artery stenosis, renovascular hypertension, cervical disc disease, and arthritis.    Left main and three-vessel coronary disease-with stable exertional angina.  Coronary bypass graft is indicated  for survival benefit and relief of symptoms.  I discussed coronary artery bypass grafting with Frederick Pace again.  I reminded him of the general nature of the procedure including the need for general anesthesia, the incisions to be used, the use of cardiopulmonary bypass, the use of temporary pacemaker wires and drainage tubes postoperatively, the expected hospital stay, and the overall recovery time.  I informed him of the indications, risks, benefits, and alternatives.  He understands the risks include, but not limited to death, MI, DVT, PE, stroke, bleeding, possible need for transfusion, infection, cardiac arrhythmias, respiratory or renal failure, as well as possibility of other unforeseeable complications.  Increased risk for renal issues due to single kidney.  Plan to graft LAD (possibly more than 1 site), OM 1 and 2, and a RCA branch.  Hypertension-blood pressure elevated.  He is compliant with medications.  Hyperlipidemia-on Repatha.  He understands and accepts the risk of surgery and wishes to proceed.  He has selected 05/28/2023 for the date of surgery.  Plan: Coronary bypass grafting on 05/28/2023  Loreli Slot, MD [facility] [phone]

## 2023-05-07 NOTE — Progress Notes (Signed)
 [address]       [place] [address]             [phone]     HPI: Frederick Pace returns to discuss bypass grafting for his left main and three-vessel coronary disease.  Frederick Pace is a 73 year old man with a history of hypertension, hyperlipidemia, mild aortic insufficiency, left main and three-vessel coronary disease, right bundle branch block, exertional angina, single kidney, renal artery stenosis, renovascular hypertension, cervical disc disease, and arthritis.  He was admitted to the hospital in November.  He had a CT for coronary calcium scoring which showed a score of 1066 (86 percentile).  He was having intermittent chest tightness which radiated to his throat and shortness of breath with exertion.  He was not having any rest symptoms.  He underwent cardiac catheterization which showed an 80 to 90% distal left main stenosis with chronic total occlusion of his RCA.  He was advised to undergo coronary artery bypass grafting.  However, he decided to go home without having the procedure.  We have called him several times and he finally agreed to come in and further discuss surgery.  Continues to have some shortness of breath and chest tightness with exertion.  Relieved with rest.  Has not used nitroglycerin.  Complains of some swelling and pain in his hands.  Is also been having some back pain.  Past Medical History:  Diagnosis Date   Aortic regurgitation    HTN (hypertension)    Mitral regurgitation    Other and unspecified hyperlipidemia    Past Surgical History:  Procedure Laterality Date   ANTERIOR CRUCIATE LIGAMENT REPAIR Left 2004   LEFT HEART CATH AND CORONARY ANGIOGRAPHY N/A 02/17/2023   Procedure: LEFT HEART CATH AND CORONARY ANGIOGRAPHY;  Surgeon: Yvonne Kendall, MD;  Location: [facility]VASIVE [facility];  Service: Cardiovascular;  Laterality: N/A;   RENAL ANGIOGRAPHY Right 01/08/2018   Procedure: RENAL ANGIOGRAPHY;  Surgeon: Annice Needy, MD;  Location:  [facility] INVASIVE CV LAB;  Service: Cardiovascular;  Laterality: Right;     Current Outpatient Medications  Medication Sig Dispense Refill   amLODipine (NORVASC) 10 MG tablet Take 1 tablet (10 mg total) by mouth daily. 90 tablet 2   aspirin EC 81 MG tablet Take 1 tablet (81 mg total) by mouth daily. Swallow whole.     Evolocumab (REPATHA SURECLICK) 140 MG/ML SOAJ Inject 140 mg into the skin every 14 (fourteen) days. 6 mL 3   losartan (COZAAR) 100 MG tablet TAKE 1 TABLET BY MOUTH DAILY 90 tablet 1   metoprolol succinate (TOPROL-XL) 25 MG 24 hr tablet Take 1 tablet (25 mg total) by mouth daily. 90 tablet 1   nitroGLYCERIN (NITROSTAT) 0.4 MG SL tablet Place 1 tablet (0.4 mg total) under the tongue every 5 (five) minutes as needed. (Patient taking differently: Place 0.4 mg under the tongue every 5 (five) minutes as needed for chest pain.) 25 tablet 3   No current facility-administered medications for this visit.    Physical Exam BP (!) 159/82 (BP Location: Left Arm)   Pulse 83   Resp 18   Ht 5\' 4"  (1.626 m)   Wt 193 lb (87.5 kg)   SpO2 97% Comment: RA  BMI 33.33 kg/m  73 year old man in no acute distress Well-developed and well-nourished Alert and oriented x 3 with no focal deficits No carotid bruits Lungs clear with equal breath sounds bilaterally Cardiac regular rate and rhythm with a faint systolic  murmur Trace edema in lower extremities   Diagnostic Tests: Echocardiogram 02/17/2023 IMPRESSIONS     1. Left ventricular ejection fraction, by estimation, is 60 to 65%. The  left ventricle has normal function. The left ventricle has no regional  wall motion abnormalities. There is mild concentric left ventricular  hypertrophy. Left ventricular diastolic  parameters are consistent with Grade I diastolic dysfunction (impaired  relaxation). The average left ventricular global longitudinal strain is  -16.3 %. The global longitudinal strain is abnormal.   2. Right ventricular systolic  function is normal. The right ventricular  size is normal. There is normal pulmonary artery systolic pressure. The  estimated right ventricular systolic pressure is 23.1 mmHg.   3. The mitral valve is normal in structure. No evidence of mitral valve  regurgitation. No evidence of mitral stenosis.   4. The aortic valve is tricuspid. There is moderate calcification of the  aortic valve. Aortic valve regurgitation is mild. Aortic valve  sclerosis/calcification is present, without any evidence of aortic  stenosis.   5. Aortic dilatation noted. There is mild dilatation of the aortic root,  measuring 41 mm.   6. The inferior vena cava is normal in size with greater than 50%  respiratory variability, suggesting right atrial pressure of 3 mmHg.   Cardiac catheterization 02/17/2023 Conclusions: Severe three-vessel coronary artery disease, including severe distal LMCA lesion involving ostia of LAD and LCx, as detailed below. Normal left ventricular systolic function and filling pressure.   Recommendations: Given severity of disease, admit for inpatient cardiac surgery consultation. Escalate rosuvastatin to 40 mg daily; continue aggressive secondary prevention of coronary artery disease. Obtain echocardiogram.   Yvonne Kendall, MD [facility] Diagnostic Dominance: Right  I personally reviewed the catheterization images.  Left main and three-vessel disease with chronic total occlusion of his RCA with left-to-right collaterals.  Preserved left ventricular function.  Impression: Frederick Pace is a 73 year old man with a history of hypertension, hyperlipidemia, mild aortic insufficiency, left main and three-vessel coronary disease, right bundle branch block, exertional angina, single kidney, renal artery stenosis, renovascular hypertension, cervical disc disease, and arthritis.    Left main and three-vessel coronary disease-with stable exertional angina.  Coronary bypass graft is indicated  for survival benefit and relief of symptoms.  I discussed coronary artery bypass grafting with Frederick Pace again.  I reminded him of the general nature of the procedure including the need for general anesthesia, the incisions to be used, the use of cardiopulmonary bypass, the use of temporary pacemaker wires and drainage tubes postoperatively, the expected hospital stay, and the overall recovery time.  I informed him of the indications, risks, benefits, and alternatives.  He understands the risks include, but not limited to death, MI, DVT, PE, stroke, bleeding, possible need for transfusion, infection, cardiac arrhythmias, respiratory or renal failure, as well as possibility of other unforeseeable complications.  Increased risk for renal issues due to single kidney.  Plan to graft LAD (possibly more than 1 site), OM 1 and 2, and a RCA branch.  Hypertension-blood pressure elevated.  He is compliant with medications.  Hyperlipidemia-on Repatha.  He understands and accepts the risk of surgery and wishes to proceed.  He has selected 05/28/2023 for the date of surgery.  Plan: Coronary bypass grafting on 05/28/2023  Loreli Slot, MD [facility] [phone]

## 2023-05-08 ENCOUNTER — Encounter: Payer: Self-pay | Admitting: *Deleted

## 2023-05-15 ENCOUNTER — Ambulatory Visit: Payer: Medicare Other | Attending: Internal Medicine | Admitting: Internal Medicine

## 2023-05-15 NOTE — Progress Notes (Deleted)
  Cardiology Office Note:  .   Date:  05/15/2023  ID:  Frederick Harbor., DOB [birth year], MRN [id number] PCP: Sherlene Shams, MD  [facility] Providers Cardiologist:  Yvonne Kendall, MD { Click to update primary MD,subspecialty MD or APP then REFRESH:1}    History of Present Illness: [name]   Frederick Blank Kendry Pfarr. is a 73 y.o. male with history of coronary artery disease, hypertension, hyperlipidemia, and solitary kidney, who presents for follow-up of coronary artery disease.  I last saw him in 02/2023 after he underwent catheterization demonstrating severe LMCA and three-vessel CAD.  He has thus far deferred putting off CABG due to other obligations despite recommendations to move forward with revascularization as soon as possible by myself and multiple other cardiovascular providers.  He is now scheduled for CABG with Dr. Dorris Fetch on 05/28/2023.  ROS: See HPI  Studies Reviewed: .        *** Risk Assessment/Calculations:   {Does this patient have ATRIAL FIBRILLATION?[phone]} No BP recorded.  {Refresh Note OR Click here to enter BP  :1}***       Physical Exam:   VS:  There were no vitals taken for this visit.   Wt Readings from Last 3 Encounters:  05/07/23 193 lb (87.5 kg)  03/12/23 187 lb (84.8 kg)  02/24/23 184 lb 9.6 oz (83.7 kg)    General:  NAD. Neck: No JVD or HJR. Lungs: Clear to auscultation bilaterally without wheezes or crackles. Heart: Regular rate and rhythm without murmurs, rubs, or gallops. Abdomen: Soft, nontender, nondistended. Extremities: No lower extremity edema.  ASSESSMENT AND PLAN: .    ***    {Are you ordering a CV Procedure (e.g. stress test, cath, DCCV, TEE, etc)?   Press F2        :[id number]}  Dispo: ***  Signed, Yvonne Kendall, MD

## 2023-05-23 NOTE — Pre-Procedure Instructions (Signed)
 Surgical Instructions   Your procedure is scheduled on May 28, 2023. Report to [facility] Main Entrance "A" at 6:30 A.M., then check in with the Admitting office. Any questions or running late day of surgery: call [phone]  Questions prior to your surgery date: call [phone], Monday-Friday, 8am-4pm. If you experience any cold or flu symptoms such as cough, fever, chills, shortness of breath, etc. between now and your scheduled surgery, please notify us at the above number.     Remember:  Do not eat or drink after midnight the night before your surgery    Take these medicines the morning of surgery with A SIP OF WATER: amLODipine (NORVASC)  metoprolol succinate (TOPROL-XL)    May take these medicines IF NEEDED: nitroGLYCERIN (NITROSTAT) - if dose taken prior to surgery, please call either of the above phone numbers   Continue taking your Aspirin through the day before surgery. DO NOT take any the morning of surgery.   One week prior to surgery, STOP taking any Aleve, Naproxen, Ibuprofen, Motrin, Advil, Goody's, BC's, all herbal medications, fish oil, and non-prescription vitamins.                     Do NOT Smoke (Tobacco/Vaping) for 24 hours prior to your procedure.  If you use a CPAP at night, you may bring your mask/headgear for your overnight stay.   You will be asked to remove any contacts, glasses, piercing's, hearing aid's, dentures/partials prior to surgery. Please bring cases for these items if needed.    Patients discharged the day of surgery will not be allowed to drive home, and someone needs to stay with them for 24 hours.  SURGICAL WAITING ROOM VISITATION Patients may have no more than 2 support people in the waiting area - these visitors may rotate.   Pre-op nurse will coordinate an appropriate time for 1 ADULT support person, who may not rotate, to accompany patient in pre-op.  Children under the age of 41 must have an adult with them who is not the  patient and must remain in the main waiting area with an adult.  If the patient needs to stay at the hospital during part of their recovery, the visitor guidelines for inpatient rooms apply.  Please refer to the [facility] website for the visitor guidelines for any additional information.   If you received a COVID test during your pre-op visit  it is requested that you wear a mask when out in public, stay away from anyone that may not be feeling well and notify your surgeon if you develop symptoms. If you have been in contact with anyone that has tested positive in the last 10 days please notify you surgeon.      Pre-operative CHG Bathing Instructions   You can play a key role in reducing the risk of infection after surgery. Your skin needs to be as free of germs as possible. You can reduce the number of germs on your skin by washing with CHG (chlorhexidine gluconate) soap before surgery. CHG is an antiseptic soap that kills germs and continues to kill germs even after washing.   DO NOT use if you have an allergy to chlorhexidine/CHG or antibacterial soaps. If your skin becomes reddened or irritated, stop using the CHG and notify one of our RNs at [phone].              TAKE A SHOWER THE NIGHT BEFORE SURGERY AND THE DAY OF SURGERY  Please keep in mind the following:  DO NOT shave, including legs and underarms, 48 hours prior to surgery.   You may shave your face before/day of surgery.  Place clean sheets on your bed the night before surgery Use a clean washcloth (not used since being washed) for each shower. DO NOT sleep with pet's night before surgery.  CHG Shower Instructions:  Wash your face and private area with normal soap. If you choose to wash your hair, wash first with your normal shampoo.  After you use shampoo/soap, rinse your hair and body thoroughly to remove shampoo/soap residue.  Turn the water OFF and apply half the bottle of CHG soap to a CLEAN washcloth.  Apply  CHG soap ONLY FROM YOUR NECK DOWN TO YOUR TOES (washing for 3-5 minutes)  DO NOT use CHG soap on face, private areas, open wounds, or sores.  Pay special attention to the area where your surgery is being performed.  If you are having back surgery, having someone wash your back for you may be helpful. Wait 2 minutes after CHG soap is applied, then you may rinse off the CHG soap.  Pat dry with a clean towel  Put on clean pajamas    Additional instructions for the day of surgery: DO NOT APPLY any lotions, deodorants, cologne, or perfumes.   Do not wear jewelry or makeup Do not wear nail polish, gel polish, artificial nails, or any other type of covering on natural nails (fingers and toes) Do not bring valuables to the hospital. [facility] is not responsible for valuables/personal belongings. Put on clean/comfortable clothes.  Please brush your teeth.  Ask your nurse before applying any prescription medications to the skin.

## 2023-05-26 ENCOUNTER — Inpatient Hospital Stay (HOSPITAL_COMMUNITY)
Admission: RE | Admit: 2023-05-26 | Discharge: 2023-05-26 | Disposition: A | Discharge status: Home / Self Care | Payer: Medicare Other | Source: Ambulatory Visit

## 2023-05-26 NOTE — Progress Notes (Signed)
 Pt was a no show for his 2pm pre-admission appointment for upcoming surgery. RN attempted to call pt twice and left a voicemail with callback number. Discussed with Darius Bump, RN with [facility] office who also tried to call pt and pts sister, both did not answer.

## 2023-05-27 ENCOUNTER — Encounter (HOSPITAL_COMMUNITY): Payer: Self-pay

## 2023-05-27 ENCOUNTER — Encounter (HOSPITAL_COMMUNITY)
Admission: RE | Admit: 2023-05-27 | Discharge: 2023-05-27 | Disposition: A | Discharge status: Home / Self Care | Source: Ambulatory Visit | Attending: Thoracic Surgery (Cardiothoracic Vascular Surgery) | Admitting: Thoracic Surgery (Cardiothoracic Vascular Surgery)

## 2023-05-27 ENCOUNTER — Ambulatory Visit (HOSPITAL_COMMUNITY)
Admission: RE | Admit: 2023-05-27 | Discharge: 2023-05-27 | Disposition: A | Discharge status: Home / Self Care | Source: Ambulatory Visit | Attending: Thoracic Surgery (Cardiothoracic Vascular Surgery) | Admitting: Thoracic Surgery (Cardiothoracic Vascular Surgery)

## 2023-05-27 ENCOUNTER — Other Ambulatory Visit: Payer: Self-pay

## 2023-05-27 VITALS — BP 134/84 | HR 73 | Temp 97.7°F | Resp 17 | Ht 64.0 in | Wt 190.5 lb

## 2023-05-27 DIAGNOSIS — D62 Acute posthemorrhagic anemia: Secondary | ICD-10-CM | POA: Diagnosis not present

## 2023-05-27 DIAGNOSIS — D696 Thrombocytopenia, unspecified: Secondary | ICD-10-CM | POA: Diagnosis not present

## 2023-05-27 DIAGNOSIS — I493 Ventricular premature depolarization: Secondary | ICD-10-CM | POA: Diagnosis not present

## 2023-05-27 DIAGNOSIS — I150 Renovascular hypertension: Secondary | ICD-10-CM | POA: Diagnosis not present

## 2023-05-27 DIAGNOSIS — D72829 Elevated white blood cell count, unspecified: Secondary | ICD-10-CM | POA: Diagnosis not present

## 2023-05-27 DIAGNOSIS — J90 Pleural effusion, not elsewhere classified: Secondary | ICD-10-CM | POA: Diagnosis not present

## 2023-05-27 DIAGNOSIS — Z7982 Long term (current) use of aspirin: Secondary | ICD-10-CM | POA: Diagnosis not present

## 2023-05-27 DIAGNOSIS — Z01818 Encounter for other preprocedural examination: Secondary | ICD-10-CM | POA: Diagnosis not present

## 2023-05-27 DIAGNOSIS — E785 Hyperlipidemia, unspecified: Secondary | ICD-10-CM | POA: Diagnosis not present

## 2023-05-27 DIAGNOSIS — I351 Nonrheumatic aortic (valve) insufficiency: Secondary | ICD-10-CM | POA: Diagnosis not present

## 2023-05-27 DIAGNOSIS — Z888 Allergy status to other drugs, medicaments and biological substances status: Secondary | ICD-10-CM | POA: Diagnosis not present

## 2023-05-27 DIAGNOSIS — I25118 Atherosclerotic heart disease of native coronary artery with other forms of angina pectoris: Secondary | ICD-10-CM | POA: Diagnosis not present

## 2023-05-27 DIAGNOSIS — I701 Atherosclerosis of renal artery: Secondary | ICD-10-CM | POA: Diagnosis not present

## 2023-05-27 DIAGNOSIS — I959 Hypotension, unspecified: Secondary | ICD-10-CM | POA: Diagnosis not present

## 2023-05-27 DIAGNOSIS — I2510 Atherosclerotic heart disease of native coronary artery without angina pectoris: Secondary | ICD-10-CM | POA: Insufficient documentation

## 2023-05-27 DIAGNOSIS — R9431 Abnormal electrocardiogram [ECG] [EKG]: Secondary | ICD-10-CM | POA: Insufficient documentation

## 2023-05-27 DIAGNOSIS — I739 Peripheral vascular disease, unspecified: Secondary | ICD-10-CM | POA: Diagnosis not present

## 2023-05-27 DIAGNOSIS — Z79899 Other long term (current) drug therapy: Secondary | ICD-10-CM | POA: Diagnosis not present

## 2023-05-27 HISTORY — DX: Renal agenesis, unilateral: Q60.0

## 2023-05-27 HISTORY — DX: Atherosclerotic heart disease of native coronary artery without angina pectoris: I25.10

## 2023-05-27 LAB — URINALYSIS, ROUTINE W REFLEX MICROSCOPIC
Bilirubin Urine: NEGATIVE
Glucose, UA: NEGATIVE mg/dL
Hgb urine dipstick: NEGATIVE
Ketones, ur: NEGATIVE mg/dL
Leukocytes,Ua: NEGATIVE
Nitrite: NEGATIVE
Protein, ur: NEGATIVE mg/dL
Specific Gravity, Urine: 1.018 (ref 1.005–1.030)
pH: 5 (ref 5.0–8.0)

## 2023-05-27 LAB — CBC
HCT: 44.7 % (ref 39.0–52.0)
Hemoglobin: 14.9 g/dL (ref 13.0–17.0)
MCH: 30.3 pg (ref 26.0–34.0)
MCHC: 33.3 g/dL (ref 30.0–36.0)
MCV: 91 fL (ref 80.0–100.0)
Platelets: 276 10*3/uL (ref 150–400)
RBC: 4.91 MIL/uL (ref 4.22–5.81)
RDW: 13.1 % (ref 11.5–15.5)
WBC: 8.7 10*3/uL (ref 4.0–10.5)
nRBC: 0 % (ref 0.0–0.2)

## 2023-05-27 LAB — PROTIME-INR
INR: 1.1 (ref 0.8–1.2)
Prothrombin Time: 14.5 s (ref 11.4–15.2)

## 2023-05-27 LAB — COMPREHENSIVE METABOLIC PANEL
ALT: 30 U/L (ref 0–44)
AST: 23 U/L (ref 15–41)
Albumin: 4.2 g/dL (ref 3.5–5.0)
Alkaline Phosphatase: 75 U/L (ref 38–126)
Anion gap: 9 (ref 5–15)
BUN: 17 mg/dL (ref 8–23)
CO2: 27 mmol/L (ref 22–32)
Calcium: 9.7 mg/dL (ref 8.9–10.3)
Chloride: 101 mmol/L (ref 98–111)
Creatinine, Ser: 1.05 mg/dL (ref 0.61–1.24)
GFR, Estimated: >60 mL/min (ref >60)
Glucose, Bld: 101 mg/dL — ABNORMAL HIGH (ref 70–99)
Potassium: 4.6 mmol/L (ref 3.5–5.1)
Sodium: 137 mmol/L (ref 135–145)
Total Bilirubin: 0.7 mg/dL (ref 0.0–1.2)
Total Protein: 7.3 g/dL (ref 6.5–8.1)

## 2023-05-27 LAB — HEMOGLOBIN A1C
Hgb A1c MFr Bld: 5.4 % (ref 4.8–5.6)
Mean Plasma Glucose: 108.28 mg/dL

## 2023-05-27 LAB — TYPE AND SCREEN
ABO/RH(D): A POS
Antibody Screen: NEGATIVE

## 2023-05-27 LAB — SURGICAL PCR SCREEN
MRSA, PCR: NEGATIVE
Staphylococcus aureus: NEGATIVE

## 2023-05-27 LAB — APTT: aPTT: 30 s (ref 24–36)

## 2023-05-27 MED ORDER — MILRINONE LACTATE IN DEXTROSE 20-5 MG/100ML-% IV SOLN
0.3000 ug/kg/min | INTRAVENOUS | Status: DC
  Filled 2023-05-27: qty 100

## 2023-05-27 MED ORDER — INSULIN REGULAR(HUMAN) IN NACL 100-0.9 UT/100ML-% IV SOLN
INTRAVENOUS | Status: AC
  Administered 2023-05-28: 1.3 [IU]/h via INTRAVENOUS
  Filled 2023-05-27: qty 100

## 2023-05-27 MED ORDER — NITROGLYCERIN IN D5W 200-5 MCG/ML-% IV SOLN
2.0000 ug/min | INTRAVENOUS | Status: DC
Start: 2023-05-28 — End: 2023-05-29
  Filled 2023-05-27: qty 250

## 2023-05-27 MED ORDER — TRANEXAMIC ACID 1000 MG/10ML IV SOLN
1.5000 mg/kg/h | INTRAVENOUS | Status: AC
  Administered 2023-05-28: 1.5 mg/kg/h via INTRAVENOUS
  Filled 2023-05-27: qty 25

## 2023-05-27 MED ORDER — CEFAZOLIN SODIUM-DEXTROSE 2-4 GM/100ML-% IV SOLN
2.0000 g | INTRAVENOUS | Status: AC
  Administered 2023-05-28 (×2): 2 g via INTRAVENOUS
  Filled 2023-05-27: qty 100

## 2023-05-27 MED ORDER — HEPARIN 30,000 UNITS/1000 ML (OHS) CELLSAVER SOLUTION
Status: DC
  Filled 2023-05-27: qty 1000

## 2023-05-27 MED ORDER — NOREPINEPHRINE 4 MG/250ML-% IV SOLN
0.0000 ug/min | INTRAVENOUS | Status: DC
  Filled 2023-05-27: qty 250

## 2023-05-27 MED ORDER — EPINEPHRINE HCL 5 MG/250ML IV SOLN IN NS
0.0000 ug/min | INTRAVENOUS | Status: DC
  Filled 2023-05-27: qty 250

## 2023-05-27 MED ORDER — TRANEXAMIC ACID (OHS) BOLUS VIA INFUSION
15.0000 mg/kg | INTRAVENOUS | Status: AC
  Administered 2023-05-28: 1312.5 mg via INTRAVENOUS
  Filled 2023-05-27: qty 1313

## 2023-05-27 MED ORDER — PLASMA-LYTE A IV SOLN
INTRAVENOUS | Status: DC
  Filled 2023-05-27: qty 2.5

## 2023-05-27 MED ORDER — POTASSIUM CHLORIDE 2 MEQ/ML IV SOLN
80.0000 meq | INTRAVENOUS | Status: DC
  Filled 2023-05-27: qty 40

## 2023-05-27 MED ORDER — MAGNESIUM SULFATE 50 % IJ SOLN
40.0000 meq | INTRAMUSCULAR | Status: DC
  Filled 2023-05-27: qty 9.85

## 2023-05-27 MED ORDER — VANCOMYCIN HCL 1.5 G IV SOLR
1500.0000 mg | INTRAVENOUS | Status: AC
  Administered 2023-05-28: 1500 mg via INTRAVENOUS
  Filled 2023-05-27: qty 30

## 2023-05-27 MED ORDER — DEXMEDETOMIDINE HCL IN NACL 400 MCG/100ML IV SOLN
0.1000 ug/kg/h | INTRAVENOUS | Status: AC
  Administered 2023-05-28: .4 ug/kg/h via INTRAVENOUS
  Filled 2023-05-27: qty 100

## 2023-05-27 MED ORDER — CEFAZOLIN SODIUM-DEXTROSE 2-4 GM/100ML-% IV SOLN
2.0000 g | INTRAVENOUS | Status: DC
  Filled 2023-05-27: qty 100

## 2023-05-27 MED ORDER — TRANEXAMIC ACID (OHS) PUMP PRIME SOLUTION
2.0000 mg/kg | INTRAVENOUS | Status: DC
  Filled 2023-05-27: qty 1.75

## 2023-05-27 MED ORDER — PHENYLEPHRINE HCL-NACL 20-0.9 MG/250ML-% IV SOLN
30.0000 ug/min | INTRAVENOUS | Status: AC
  Administered 2023-05-28: 10 ug/min via INTRAVENOUS
  Filled 2023-05-27: qty 250

## 2023-05-27 NOTE — Progress Notes (Signed)
 PCP - Dr. Duncan Dull Cardiologist - Dr. Cristal Deer End  PPM/ICD - denies   Chest x-ray - 05/27/23 EKG - 05/27/23 Stress Test - denies ECHO - 02/17/23 Cardiac Cath - 02/17/23  Sleep Study - denies   DM- denies  Last dose of GLP1 agonist-  n/a   Blood Thinner Instructions: n/a Aspirin Instructions: hold DOS  ERAS Protcol - no, NPO   COVID TEST- n/a   Anesthesia review: yes, cardiac hx  Patient denies shortness of breath, fever, cough and chest pain at PAT appointment   All instructions explained to the patient, with a verbal understanding of the material. Patient agrees to go over the instructions while at home for a better understanding.  The opportunity to ask questions was provided.

## 2023-05-28 ENCOUNTER — Inpatient Hospital Stay (HOSPITAL_COMMUNITY): Admitting: Certified Registered Nurse Anesthetist

## 2023-05-28 ENCOUNTER — Inpatient Hospital Stay (HOSPITAL_COMMUNITY)
Admission: RE | Admit: 2023-05-28 | Discharge: 2023-06-02 | DRG: 232 | Disposition: A | Discharge status: Home / Self Care | Payer: Medicare Other | Attending: Thoracic Surgery (Cardiothoracic Vascular Surgery) | Admitting: Thoracic Surgery (Cardiothoracic Vascular Surgery)

## 2023-05-28 ENCOUNTER — Other Ambulatory Visit: Payer: Self-pay

## 2023-05-28 ENCOUNTER — Inpatient Hospital Stay (HOSPITAL_COMMUNITY)

## 2023-05-28 ENCOUNTER — Encounter (HOSPITAL_COMMUNITY): Payer: Self-pay | Admitting: Thoracic Surgery (Cardiothoracic Vascular Surgery)

## 2023-05-28 ENCOUNTER — Inpatient Hospital Stay (HOSPITAL_COMMUNITY)
Admission: RE | Disposition: A | Discharge status: Home / Self Care | Payer: Self-pay | Source: Home / Self Care | Attending: Thoracic Surgery (Cardiothoracic Vascular Surgery)

## 2023-05-28 DIAGNOSIS — Z4682 Encounter for fitting and adjustment of non-vascular catheter: Secondary | ICD-10-CM | POA: Diagnosis not present

## 2023-05-28 DIAGNOSIS — Z79899 Other long term (current) drug therapy: Secondary | ICD-10-CM | POA: Diagnosis not present

## 2023-05-28 DIAGNOSIS — I10 Essential (primary) hypertension: Secondary | ICD-10-CM | POA: Diagnosis not present

## 2023-05-28 DIAGNOSIS — I959 Hypotension, unspecified: Secondary | ICD-10-CM | POA: Diagnosis not present

## 2023-05-28 DIAGNOSIS — D62 Acute posthemorrhagic anemia: Secondary | ICD-10-CM | POA: Diagnosis not present

## 2023-05-28 DIAGNOSIS — I351 Nonrheumatic aortic (valve) insufficiency: Secondary | ICD-10-CM | POA: Diagnosis not present

## 2023-05-28 DIAGNOSIS — Z7982 Long term (current) use of aspirin: Secondary | ICD-10-CM

## 2023-05-28 DIAGNOSIS — J90 Pleural effusion, not elsewhere classified: Secondary | ICD-10-CM | POA: Diagnosis not present

## 2023-05-28 DIAGNOSIS — I739 Peripheral vascular disease, unspecified: Secondary | ICD-10-CM | POA: Diagnosis present

## 2023-05-28 DIAGNOSIS — R0989 Other specified symptoms and signs involving the circulatory and respiratory systems: Secondary | ICD-10-CM | POA: Diagnosis not present

## 2023-05-28 DIAGNOSIS — Z888 Allergy status to other drugs, medicaments and biological substances status: Secondary | ICD-10-CM | POA: Diagnosis not present

## 2023-05-28 DIAGNOSIS — Z452 Encounter for adjustment and management of vascular access device: Secondary | ICD-10-CM | POA: Diagnosis not present

## 2023-05-28 DIAGNOSIS — E66812 Obesity, class 2: Secondary | ICD-10-CM | POA: Diagnosis present

## 2023-05-28 DIAGNOSIS — R918 Other nonspecific abnormal finding of lung field: Secondary | ICD-10-CM | POA: Diagnosis not present

## 2023-05-28 DIAGNOSIS — I701 Atherosclerosis of renal artery: Secondary | ICD-10-CM | POA: Diagnosis present

## 2023-05-28 DIAGNOSIS — Z6835 Body mass index (BMI) 35.0-35.9, adult: Secondary | ICD-10-CM

## 2023-05-28 DIAGNOSIS — I493 Ventricular premature depolarization: Secondary | ICD-10-CM | POA: Diagnosis not present

## 2023-05-28 DIAGNOSIS — I2510 Atherosclerotic heart disease of native coronary artery without angina pectoris: Secondary | ICD-10-CM

## 2023-05-28 DIAGNOSIS — I25118 Atherosclerotic heart disease of native coronary artery with other forms of angina pectoris: Secondary | ICD-10-CM | POA: Diagnosis not present

## 2023-05-28 DIAGNOSIS — I150 Renovascular hypertension: Secondary | ICD-10-CM | POA: Diagnosis present

## 2023-05-28 DIAGNOSIS — E785 Hyperlipidemia, unspecified: Secondary | ICD-10-CM | POA: Diagnosis present

## 2023-05-28 DIAGNOSIS — Z951 Presence of aortocoronary bypass graft: Secondary | ICD-10-CM | POA: Diagnosis not present

## 2023-05-28 DIAGNOSIS — D696 Thrombocytopenia, unspecified: Secondary | ICD-10-CM | POA: Diagnosis not present

## 2023-05-28 DIAGNOSIS — I25119 Atherosclerotic heart disease of native coronary artery with unspecified angina pectoris: Secondary | ICD-10-CM

## 2023-05-28 DIAGNOSIS — Z48812 Encounter for surgical aftercare following surgery on the circulatory system: Secondary | ICD-10-CM | POA: Diagnosis not present

## 2023-05-28 DIAGNOSIS — E7800 Pure hypercholesterolemia, unspecified: Secondary | ICD-10-CM | POA: Diagnosis not present

## 2023-05-28 DIAGNOSIS — D72829 Elevated white blood cell count, unspecified: Secondary | ICD-10-CM | POA: Diagnosis not present

## 2023-05-28 DIAGNOSIS — J9811 Atelectasis: Secondary | ICD-10-CM | POA: Diagnosis not present

## 2023-05-28 LAB — POCT I-STAT, CHEM 8
BUN: 18 mg/dL (ref 8–23)
BUN: 17 mg/dL (ref 8–23)
BUN: 16 mg/dL (ref 8–23)
BUN: 16 mg/dL (ref 8–23)
BUN: 15 mg/dL (ref 8–23)
Calcium, Ion: 1.22 mmol/L (ref 1.15–1.40)
Calcium, Ion: 1.21 mmol/L (ref 1.15–1.40)
Calcium, Ion: 0.97 mmol/L — ABNORMAL LOW (ref 1.15–1.40)
Calcium, Ion: 1.03 mmol/L — ABNORMAL LOW (ref 1.15–1.40)
Calcium, Ion: 1.14 mmol/L — ABNORMAL LOW (ref 1.15–1.40)
Chloride: 104 mmol/L (ref 98–111)
Chloride: 104 mmol/L (ref 98–111)
Chloride: 102 mmol/L (ref 98–111)
Chloride: 105 mmol/L (ref 98–111)
Chloride: 105 mmol/L (ref 98–111)
Creatinine, Ser: 1 mg/dL (ref 0.61–1.24)
Creatinine, Ser: 1 mg/dL (ref 0.61–1.24)
Creatinine, Ser: 0.8 mg/dL (ref 0.61–1.24)
Creatinine, Ser: 0.9 mg/dL (ref 0.61–1.24)
Creatinine, Ser: 0.8 mg/dL (ref 0.61–1.24)
Glucose, Bld: 100 mg/dL — ABNORMAL HIGH (ref 70–99)
Glucose, Bld: 125 mg/dL — ABNORMAL HIGH (ref 70–99)
Glucose, Bld: 128 mg/dL — ABNORMAL HIGH (ref 70–99)
Glucose, Bld: 137 mg/dL — ABNORMAL HIGH (ref 70–99)
Glucose, Bld: 140 mg/dL — ABNORMAL HIGH (ref 70–99)
HCT: 38 % — ABNORMAL LOW (ref 39.0–52.0)
HCT: 36 % — ABNORMAL LOW (ref 39.0–52.0)
HCT: 23 % — ABNORMAL LOW (ref 39.0–52.0)
HCT: 26 % — ABNORMAL LOW (ref 39.0–52.0)
HCT: 25 % — ABNORMAL LOW (ref 39.0–52.0)
Hemoglobin: 12.9 g/dL — ABNORMAL LOW (ref 13.0–17.0)
Hemoglobin: 12.2 g/dL — ABNORMAL LOW (ref 13.0–17.0)
Hemoglobin: 7.8 g/dL — ABNORMAL LOW (ref 13.0–17.0)
Hemoglobin: 8.8 g/dL — ABNORMAL LOW (ref 13.0–17.0)
Hemoglobin: 8.5 g/dL — ABNORMAL LOW (ref 13.0–17.0)
Potassium: 3.9 mmol/L (ref 3.5–5.1)
Potassium: 4 mmol/L (ref 3.5–5.1)
Potassium: 5.2 mmol/L — ABNORMAL HIGH (ref 3.5–5.1)
Potassium: 5.1 mmol/L (ref 3.5–5.1)
Potassium: 4.3 mmol/L (ref 3.5–5.1)
Sodium: 139 mmol/L (ref 135–145)
Sodium: 138 mmol/L (ref 135–145)
Sodium: 136 mmol/L (ref 135–145)
Sodium: 136 mmol/L (ref 135–145)
Sodium: 138 mmol/L (ref 135–145)
TCO2: 28 mmol/L (ref 22–32)
TCO2: 26 mmol/L (ref 22–32)
TCO2: 26 mmol/L (ref 22–32)
TCO2: 27 mmol/L (ref 22–32)
TCO2: 24 mmol/L (ref 22–32)

## 2023-05-28 LAB — GLUCOSE, CAPILLARY
Glucose-Capillary: 104 mg/dL — ABNORMAL HIGH (ref 70–99)
Glucose-Capillary: 146 mg/dL — ABNORMAL HIGH (ref 70–99)
Glucose-Capillary: 130 mg/dL — ABNORMAL HIGH (ref 70–99)
Glucose-Capillary: 78 mg/dL (ref 70–99)
Glucose-Capillary: 109 mg/dL — ABNORMAL HIGH (ref 70–99)
Glucose-Capillary: 128 mg/dL — ABNORMAL HIGH (ref 70–99)
Glucose-Capillary: 122 mg/dL — ABNORMAL HIGH (ref 70–99)

## 2023-05-28 LAB — POCT I-STAT EG7
Acid-Base Excess: 0 mmol/L (ref 0.0–2.0)
Acid-base deficit: 2 mmol/L (ref 0.0–2.0)
Bicarbonate: 25.9 mmol/L (ref 20.0–28.0)
Bicarbonate: 23.1 mmol/L (ref 20.0–28.0)
Calcium, Ion: 1.05 mmol/L — ABNORMAL LOW (ref 1.15–1.40)
Calcium, Ion: 1.04 mmol/L — ABNORMAL LOW (ref 1.15–1.40)
HCT: 29 % — ABNORMAL LOW (ref 39.0–52.0)
HCT: 25 % — ABNORMAL LOW (ref 39.0–52.0)
Hemoglobin: 9.9 g/dL — ABNORMAL LOW (ref 13.0–17.0)
Hemoglobin: 8.5 g/dL — ABNORMAL LOW (ref 13.0–17.0)
O2 Saturation: 76 %
O2 Saturation: 98 %
Patient temperature: 35.9
Potassium: 3.8 mmol/L (ref 3.5–5.1)
Potassium: 4.5 mmol/L (ref 3.5–5.1)
Sodium: 140 mmol/L (ref 135–145)
Sodium: 140 mmol/L (ref 135–145)
TCO2: 27 mmol/L (ref 22–32)
TCO2: 24 mmol/L (ref 22–32)
pCO2, Ven: 49 mmHg (ref 44–60)
pCO2, Ven: 37.6 mmHg — ABNORMAL LOW (ref 44–60)
pH, Ven: 7.331 (ref 7.25–7.43)
pH, Ven: 7.391 (ref 7.25–7.43)
pO2, Ven: 44 mmHg (ref 32–45)
pO2, Ven: 106 mmHg — ABNORMAL HIGH (ref 32–45)

## 2023-05-28 LAB — CBC
HCT: 30.1 % — ABNORMAL LOW (ref 39.0–52.0)
HCT: 25.7 % — ABNORMAL LOW (ref 39.0–52.0)
Hemoglobin: 10.1 g/dL — ABNORMAL LOW (ref 13.0–17.0)
Hemoglobin: 8.6 g/dL — ABNORMAL LOW (ref 13.0–17.0)
MCH: 30.9 pg (ref 26.0–34.0)
MCH: 30.7 pg (ref 26.0–34.0)
MCHC: 33.6 g/dL (ref 30.0–36.0)
MCHC: 33.5 g/dL (ref 30.0–36.0)
MCV: 92 fL (ref 80.0–100.0)
MCV: 91.8 fL (ref 80.0–100.0)
Platelets: 161 10*3/uL (ref 150–400)
Platelets: 143 10*3/uL — ABNORMAL LOW (ref 150–400)
RBC: 3.27 MIL/uL — ABNORMAL LOW (ref 4.22–5.81)
RBC: 2.8 MIL/uL — ABNORMAL LOW (ref 4.22–5.81)
RDW: 13.1 % (ref 11.5–15.5)
RDW: 13.1 % (ref 11.5–15.5)
WBC: 22.6 10*3/uL — ABNORMAL HIGH (ref 4.0–10.5)
WBC: 12.8 10*3/uL — ABNORMAL HIGH (ref 4.0–10.5)
nRBC: 0 % (ref 0.0–0.2)
nRBC: 0 % (ref 0.0–0.2)

## 2023-05-28 LAB — PROTIME-INR
INR: 1.6 — ABNORMAL HIGH (ref 0.8–1.2)
Prothrombin Time: 19.3 s — ABNORMAL HIGH (ref 11.4–15.2)

## 2023-05-28 LAB — POCT I-STAT 7, (LYTES, BLD GAS, ICA,H+H)
Acid-Base Excess: 0 mmol/L (ref 0.0–2.0)
Acid-base deficit: 1 mmol/L (ref 0.0–2.0)
Acid-base deficit: 1 mmol/L (ref 0.0–2.0)
Acid-base deficit: 3 mmol/L — ABNORMAL HIGH (ref 0.0–2.0)
Acid-base deficit: 3 mmol/L — ABNORMAL HIGH (ref 0.0–2.0)
Acid-base deficit: 3 mmol/L — ABNORMAL HIGH (ref 0.0–2.0)
Bicarbonate: 24.7 mmol/L (ref 20.0–28.0)
Bicarbonate: 24.6 mmol/L (ref 20.0–28.0)
Bicarbonate: 24.4 mmol/L (ref 20.0–28.0)
Bicarbonate: 23.3 mmol/L (ref 20.0–28.0)
Bicarbonate: 23.1 mmol/L (ref 20.0–28.0)
Bicarbonate: 22.9 mmol/L (ref 20.0–28.0)
Calcium, Ion: 1.2 mmol/L (ref 1.15–1.40)
Calcium, Ion: 0.95 mmol/L — ABNORMAL LOW (ref 1.15–1.40)
Calcium, Ion: 1.01 mmol/L — ABNORMAL LOW (ref 1.15–1.40)
Calcium, Ion: 1.14 mmol/L — ABNORMAL LOW (ref 1.15–1.40)
Calcium, Ion: 1.09 mmol/L — ABNORMAL LOW (ref 1.15–1.40)
Calcium, Ion: 1.08 mmol/L — ABNORMAL LOW (ref 1.15–1.40)
HCT: 39 % (ref 39.0–52.0)
HCT: 27 % — ABNORMAL LOW (ref 39.0–52.0)
HCT: 26 % — ABNORMAL LOW (ref 39.0–52.0)
HCT: 27 % — ABNORMAL LOW (ref 39.0–52.0)
HCT: 27 % — ABNORMAL LOW (ref 39.0–52.0)
HCT: 24 % — ABNORMAL LOW (ref 39.0–52.0)
Hemoglobin: 13.3 g/dL (ref 13.0–17.0)
Hemoglobin: 9.2 g/dL — ABNORMAL LOW (ref 13.0–17.0)
Hemoglobin: 8.8 g/dL — ABNORMAL LOW (ref 13.0–17.0)
Hemoglobin: 9.2 g/dL — ABNORMAL LOW (ref 13.0–17.0)
Hemoglobin: 9.2 g/dL — ABNORMAL LOW (ref 13.0–17.0)
Hemoglobin: 8.2 g/dL — ABNORMAL LOW (ref 13.0–17.0)
O2 Saturation: 100 %
O2 Saturation: 100 %
O2 Saturation: 100 %
O2 Saturation: 100 %
O2 Saturation: 97 %
O2 Saturation: 97 %
Patient temperature: 36.1
Patient temperature: 36.2
Potassium: 3.9 mmol/L (ref 3.5–5.1)
Potassium: 4.1 mmol/L (ref 3.5–5.1)
Potassium: 5 mmol/L (ref 3.5–5.1)
Potassium: 4.3 mmol/L (ref 3.5–5.1)
Potassium: 4.1 mmol/L (ref 3.5–5.1)
Potassium: 4 mmol/L (ref 3.5–5.1)
Sodium: 139 mmol/L (ref 135–145)
Sodium: 140 mmol/L (ref 135–145)
Sodium: 137 mmol/L (ref 135–145)
Sodium: 139 mmol/L (ref 135–145)
Sodium: 139 mmol/L (ref 135–145)
Sodium: 141 mmol/L (ref 135–145)
TCO2: 26 mmol/L (ref 22–32)
TCO2: 26 mmol/L (ref 22–32)
TCO2: 26 mmol/L (ref 22–32)
TCO2: 25 mmol/L (ref 22–32)
TCO2: 24 mmol/L (ref 22–32)
TCO2: 24 mmol/L (ref 22–32)
pCO2 arterial: 45.3 mmHg (ref 32–48)
pCO2 arterial: 43 mmHg (ref 32–48)
pCO2 arterial: 37.3 mmHg (ref 32–48)
pCO2 arterial: 46.4 mmHg (ref 32–48)
pCO2 arterial: 43.5 mmHg (ref 32–48)
pCO2 arterial: 42.8 mmHg (ref 32–48)
pH, Arterial: 7.344 — ABNORMAL LOW (ref 7.35–7.45)
pH, Arterial: 7.365 (ref 7.35–7.45)
pH, Arterial: 7.423 (ref 7.35–7.45)
pH, Arterial: 7.308 — ABNORMAL LOW (ref 7.35–7.45)
pH, Arterial: 7.328 — ABNORMAL LOW (ref 7.35–7.45)
pH, Arterial: 7.333 — ABNORMAL LOW (ref 7.35–7.45)
pO2, Arterial: 479 mmHg — ABNORMAL HIGH (ref 83–108)
pO2, Arterial: 373 mmHg — ABNORMAL HIGH (ref 83–108)
pO2, Arterial: 372 mmHg — ABNORMAL HIGH (ref 83–108)
pO2, Arterial: 262 mmHg — ABNORMAL HIGH (ref 83–108)
pO2, Arterial: 98 mmHg (ref 83–108)
pO2, Arterial: 96 mmHg (ref 83–108)

## 2023-05-28 LAB — ABO/RH: ABO/RH(D): A POS

## 2023-05-28 LAB — BASIC METABOLIC PANEL
Anion gap: 12 (ref 5–15)
BUN: 15 mg/dL (ref 8–23)
CO2: 19 mmol/L — ABNORMAL LOW (ref 22–32)
Calcium: 7.4 mg/dL — ABNORMAL LOW (ref 8.9–10.3)
Chloride: 108 mmol/L (ref 98–111)
Creatinine, Ser: 1.06 mg/dL (ref 0.61–1.24)
GFR, Estimated: >60 mL/min (ref >60)
Glucose, Bld: 130 mg/dL — ABNORMAL HIGH (ref 70–99)
Potassium: 4.3 mmol/L (ref 3.5–5.1)
Sodium: 139 mmol/L (ref 135–145)

## 2023-05-28 LAB — MAGNESIUM: Magnesium: 3 mg/dL — ABNORMAL HIGH (ref 1.7–2.4)

## 2023-05-28 LAB — ECHO INTRAOPERATIVE TEE
Height: 64 in
Weight: 3047.99 [oz_av]

## 2023-05-28 LAB — PLATELET COUNT: Platelets: 174 10*3/uL (ref 150–400)

## 2023-05-28 LAB — HEMOGLOBIN AND HEMATOCRIT, BLOOD
HCT: 27.7 % — ABNORMAL LOW (ref 39.0–52.0)
Hemoglobin: 9.4 g/dL — ABNORMAL LOW (ref 13.0–17.0)

## 2023-05-28 LAB — APTT: aPTT: 30 s (ref 24–36)

## 2023-05-28 SURGERY — CORONARY ARTERY BYPASS GRAFTING (CABG)
Anesthesia: General | Site: Chest

## 2023-05-28 MED ORDER — ASPIRIN 81 MG PO CHEW: 324.0000 mg | CHEWABLE_TABLET | Freq: Every day | ORAL | Status: DC

## 2023-05-28 MED ORDER — DEXTROSE 50 % IV SOLN: 0.0000 mL | INTRAVENOUS | Status: DC | PRN

## 2023-05-28 MED ORDER — SODIUM CHLORIDE 0.9 % IV SOLN: 250.0000 mL | INTRAVENOUS | Status: DC

## 2023-05-28 MED ORDER — MAGNESIUM SULFATE 4 GM/100ML IV SOLN
4.0000 g | Freq: Once | INTRAVENOUS | Status: AC
  Administered 2023-05-28: 4 g via INTRAVENOUS
  Filled 2023-05-28: qty 100

## 2023-05-28 MED ORDER — ACETAMINOPHEN 160 MG/5ML PO SOLN
650.0000 mg | Freq: Once | ORAL | Status: AC
  Administered 2023-05-28: 650 mg
  Filled 2023-05-28: qty 20.3

## 2023-05-28 MED ORDER — SODIUM CHLORIDE 0.9% FLUSH
3.0000 mL | INTRAVENOUS | Status: DC | PRN
  Administered 2023-05-29: 3 mL via INTRAVENOUS

## 2023-05-28 MED ORDER — MIDAZOLAM HCL 2 MG/2ML IJ SOLN
INTRAMUSCULAR | Status: AC
  Filled 2023-05-28: qty 2

## 2023-05-28 MED ORDER — ALBUMIN HUMAN 5 % IV SOLN: INTRAVENOUS | Status: DC | PRN

## 2023-05-28 MED ORDER — ONDANSETRON HCL 4 MG/2ML IJ SOLN: 4.0000 mg | Freq: Four times a day (QID) | INTRAMUSCULAR | Status: DC | PRN

## 2023-05-28 MED ORDER — LACTATED RINGERS IV SOLN: INTRAVENOUS | Status: AC

## 2023-05-28 MED ORDER — PHENYLEPHRINE 80 MCG/ML (10ML) SYRINGE FOR IV PUSH (FOR BLOOD PRESSURE SUPPORT)
PREFILLED_SYRINGE | INTRAVENOUS | Status: DC | PRN
  Administered 2023-05-28: 40 ug via INTRAVENOUS
  Administered 2023-05-28 (×4): 80 ug via INTRAVENOUS
  Administered 2023-05-28: 120 ug via INTRAVENOUS
  Administered 2023-05-28 (×3): 80 ug via INTRAVENOUS

## 2023-05-28 MED ORDER — EPHEDRINE SULFATE-NACL 50-0.9 MG/10ML-% IV SOSY
PREFILLED_SYRINGE | INTRAVENOUS | Status: DC | PRN
  Administered 2023-05-28 (×2): 10 mg via INTRAVENOUS

## 2023-05-28 MED ORDER — TRAMADOL HCL 50 MG PO TABS
50.0000 mg | ORAL_TABLET | ORAL | Status: DC | PRN
  Administered 2023-05-28 – 2023-05-29 (×2): 50 mg via ORAL
  Filled 2023-05-28 (×2): qty 1

## 2023-05-28 MED ORDER — PANTOPRAZOLE SODIUM 40 MG PO TBEC
40.0000 mg | DELAYED_RELEASE_TABLET | Freq: Every day | ORAL | Status: DC
  Administered 2023-05-30 – 2023-06-02 (×4): 40 mg via ORAL
  Filled 2023-05-28 (×4): qty 1

## 2023-05-28 MED ORDER — SODIUM CHLORIDE (PF) 0.9 % IJ SOLN: OROMUCOSAL | Status: DC | PRN

## 2023-05-28 MED ORDER — PANTOPRAZOLE SODIUM 40 MG IV SOLR
40.0000 mg | Freq: Every day | INTRAVENOUS | Status: AC
  Administered 2023-05-28 – 2023-05-29 (×2): 40 mg via INTRAVENOUS
  Filled 2023-05-28 (×2): qty 10

## 2023-05-28 MED ORDER — VANCOMYCIN HCL IN DEXTROSE 1-5 GM/200ML-% IV SOLN
1000.0000 mg | Freq: Once | INTRAVENOUS | Status: AC
  Administered 2023-05-28: 1000 mg via INTRAVENOUS
  Filled 2023-05-28: qty 200

## 2023-05-28 MED ORDER — ASPIRIN 325 MG PO TBEC
325.0000 mg | DELAYED_RELEASE_TABLET | Freq: Every day | ORAL | Status: DC
Start: 2023-05-29 — End: 2023-05-30
  Administered 2023-05-29: 325 mg via ORAL
  Filled 2023-05-28: qty 1

## 2023-05-28 MED ORDER — VASOPRESSIN 20 UNIT/ML IV SOLN
INTRAVENOUS | Status: DC | PRN
  Administered 2023-05-28: 1 [IU] via INTRAVENOUS

## 2023-05-28 MED ORDER — ~~LOC~~ CARDIAC SURGERY, PATIENT & FAMILY EDUCATION
Freq: Once | Status: DC
Start: 2023-05-28 — End: 2023-05-28
  Filled 2023-05-28: qty 1

## 2023-05-28 MED ORDER — CALCIUM CHLORIDE 10 % IV SOLN
INTRAVENOUS | Status: DC | PRN
  Administered 2023-05-28: 250 mg via INTRAVENOUS
  Administered 2023-05-28: 100 mg via INTRAVENOUS

## 2023-05-28 MED ORDER — CHLORHEXIDINE GLUCONATE 0.12 % MT SOLN
15.0000 mL | Freq: Once | OROMUCOSAL | Status: AC
  Administered 2023-05-28: 15 mL via OROMUCOSAL
  Filled 2023-05-28: qty 15

## 2023-05-28 MED ORDER — CHLORHEXIDINE GLUCONATE CLOTH 2 % EX PADS
6.0000 | MEDICATED_PAD | Freq: Every day | CUTANEOUS | Status: DC
  Administered 2023-05-28 – 2023-06-02 (×4): 6 via TOPICAL

## 2023-05-28 MED ORDER — SODIUM CHLORIDE 0.9% FLUSH
3.0000 mL | Freq: Two times a day (BID) | INTRAVENOUS | Status: DC
Start: 2023-05-29 — End: 2023-05-30
  Administered 2023-05-29 – 2023-05-30 (×2): 3 mL via INTRAVENOUS

## 2023-05-28 MED ORDER — METOPROLOL TARTRATE 12.5 MG HALF TABLET
12.5000 mg | ORAL_TABLET | Freq: Once | ORAL | Status: DC
  Filled 2023-05-28: qty 1

## 2023-05-28 MED ORDER — METOCLOPRAMIDE HCL 5 MG/ML IJ SOLN
10.0000 mg | Freq: Four times a day (QID) | INTRAMUSCULAR | Status: AC
  Administered 2023-05-28 – 2023-05-29 (×5): 10 mg via INTRAVENOUS
  Filled 2023-05-28 (×5): qty 2

## 2023-05-28 MED ORDER — POTASSIUM CHLORIDE 10 MEQ/50ML IV SOLN: 10.0000 meq | INTRAVENOUS | Status: AC

## 2023-05-28 MED ORDER — MIDAZOLAM HCL 2 MG/2ML IJ SOLN
2.0000 mg | INTRAMUSCULAR | Status: DC | PRN
  Filled 2023-05-28: qty 2

## 2023-05-28 MED ORDER — PHENYLEPHRINE HCL-NACL 20-0.9 MG/250ML-% IV SOLN
0.0000 ug/min | INTRAVENOUS | Status: DC
  Administered 2023-05-28: 50 ug/min via INTRAVENOUS
  Administered 2023-05-29: 90 ug/min via INTRAVENOUS
  Administered 2023-05-29: 110 ug/min via INTRAVENOUS
  Administered 2023-05-29: 100 ug/min via INTRAVENOUS
  Filled 2023-05-28 (×4): qty 250

## 2023-05-28 MED ORDER — MORPHINE SULFATE (PF) 2 MG/ML IV SOLN
1.0000 mg | INTRAVENOUS | Status: DC | PRN
  Filled 2023-05-28: qty 1

## 2023-05-28 MED ORDER — SODIUM CHLORIDE 0.9 % IV SOLN: INTRAVENOUS | Status: AC

## 2023-05-28 MED ORDER — HEPARIN SODIUM (PORCINE) 1000 UNIT/ML IJ SOLN
INTRAMUSCULAR | Status: DC | PRN
  Administered 2023-05-28: 2000 [IU] via INTRAVENOUS
  Administered 2023-05-28: 34000 [IU] via INTRAVENOUS

## 2023-05-28 MED ORDER — PROTAMINE SULFATE 10 MG/ML IV SOLN
INTRAVENOUS | Status: DC | PRN
  Administered 2023-05-28: 360 mg via INTRAVENOUS

## 2023-05-28 MED ORDER — BISACODYL 10 MG RE SUPP
10.0000 mg | Freq: Every day | RECTAL | Status: DC
Start: 2023-05-29 — End: 2023-06-02

## 2023-05-28 MED ORDER — CEFAZOLIN SODIUM-DEXTROSE 2-4 GM/100ML-% IV SOLN
2.0000 g | Freq: Three times a day (TID) | INTRAVENOUS | Status: AC
  Administered 2023-05-28 – 2023-05-30 (×6): 2 g via INTRAVENOUS
  Filled 2023-05-28 (×6): qty 100

## 2023-05-28 MED ORDER — INSULIN REGULAR(HUMAN) IN NACL 100-0.9 UT/100ML-% IV SOLN: INTRAVENOUS | Status: DC

## 2023-05-28 MED ORDER — FENTANYL CITRATE (PF) 250 MCG/5ML IJ SOLN
INTRAMUSCULAR | Status: AC
  Filled 2023-05-28: qty 5

## 2023-05-28 MED ORDER — NITROGLYCERIN IN D5W 200-5 MCG/ML-% IV SOLN: 0.0000 ug/min | INTRAVENOUS | Status: DC

## 2023-05-28 MED ORDER — SODIUM CHLORIDE 0.45 % IV SOLN: INTRAVENOUS | Status: AC | PRN

## 2023-05-28 MED ORDER — ORAL CARE MOUTH RINSE: 15.0000 mL | OROMUCOSAL | Status: DC | PRN

## 2023-05-28 MED ORDER — MIDAZOLAM HCL (PF) 5 MG/ML IJ SOLN
INTRAMUSCULAR | Status: DC | PRN
  Administered 2023-05-28: 2 mg via INTRAVENOUS
  Administered 2023-05-28: 1 mg via INTRAVENOUS

## 2023-05-28 MED ORDER — LACTATED RINGERS IV SOLN: INTRAVENOUS | Status: DC | PRN

## 2023-05-28 MED ORDER — ORAL CARE MOUTH RINSE
15.0000 mL | OROMUCOSAL | Status: DC
  Administered 2023-05-28 – 2023-06-02 (×11): 15 mL via OROMUCOSAL

## 2023-05-28 MED ORDER — HEMOSTATIC AGENTS (NO CHARGE) OPTIME
TOPICAL | Status: DC | PRN
  Administered 2023-05-28 (×2): 1

## 2023-05-28 MED ORDER — CHLORHEXIDINE GLUCONATE 0.12 % MT SOLN
15.0000 mL | OROMUCOSAL | Status: AC
  Administered 2023-05-28: 15 mL via OROMUCOSAL
  Filled 2023-05-28: qty 15

## 2023-05-28 MED ORDER — ACETAMINOPHEN 160 MG/5ML PO SOLN: 1000.0000 mg | Freq: Four times a day (QID) | ORAL | Status: DC

## 2023-05-28 MED ORDER — ONDANSETRON HCL 4 MG/2ML IJ SOLN
INTRAMUSCULAR | Status: DC | PRN
  Administered 2023-05-28: 8 mg via INTRAVENOUS

## 2023-05-28 MED ORDER — FENTANYL CITRATE (PF) 250 MCG/5ML IJ SOLN
INTRAMUSCULAR | Status: DC | PRN
  Administered 2023-05-28: 50 ug via INTRAVENOUS
  Administered 2023-05-28: 150 ug via INTRAVENOUS
  Administered 2023-05-28: 50 ug via INTRAVENOUS

## 2023-05-28 MED ORDER — VASOPRESSIN 20 UNIT/ML IV SOLN
INTRAVENOUS | Status: AC
  Filled 2023-05-28: qty 1

## 2023-05-28 MED ORDER — PROPOFOL 10 MG/ML IV BOLUS
INTRAVENOUS | Status: AC
  Filled 2023-05-28: qty 20

## 2023-05-28 MED ORDER — CHLORHEXIDINE GLUCONATE 4 % EX SOLN: 30.0000 mL | CUTANEOUS | Status: DC

## 2023-05-28 MED ORDER — DEXMEDETOMIDINE HCL IN NACL 400 MCG/100ML IV SOLN: 0.0000 ug/kg/h | INTRAVENOUS | Status: DC

## 2023-05-28 MED ORDER — ASPIRIN 81 MG PO CHEW
324.0000 mg | CHEWABLE_TABLET | Freq: Once | ORAL | Status: AC
  Administered 2023-05-28: 324 mg via ORAL
  Filled 2023-05-28: qty 4

## 2023-05-28 MED ORDER — ROCURONIUM BROMIDE 10 MG/ML (PF) SYRINGE
PREFILLED_SYRINGE | INTRAVENOUS | Status: DC | PRN
  Administered 2023-05-28: 50 mg via INTRAVENOUS
  Administered 2023-05-28: 100 mg via INTRAVENOUS
  Administered 2023-05-28: 50 mg via INTRAVENOUS

## 2023-05-28 MED ORDER — ALBUMIN HUMAN 5 % IV SOLN
250.0000 mL | INTRAVENOUS | Status: AC | PRN
  Administered 2023-05-28 – 2023-05-29 (×5): 12.5 g via INTRAVENOUS
  Filled 2023-05-28 (×2): qty 250

## 2023-05-28 MED ORDER — PROPOFOL 10 MG/ML IV BOLUS
INTRAVENOUS | Status: DC | PRN
  Administered 2023-05-28: 130 mg via INTRAVENOUS

## 2023-05-28 MED ORDER — METOPROLOL TARTRATE 12.5 MG HALF TABLET
12.5000 mg | ORAL_TABLET | Freq: Two times a day (BID) | ORAL | Status: DC
  Administered 2023-05-29: 12.5 mg via ORAL
  Filled 2023-05-28: qty 1

## 2023-05-28 MED ORDER — DOCUSATE SODIUM 100 MG PO CAPS
200.0000 mg | ORAL_CAPSULE | Freq: Every day | ORAL | Status: DC
  Administered 2023-05-29 – 2023-06-01 (×4): 200 mg via ORAL
  Filled 2023-05-28 (×5): qty 2

## 2023-05-28 MED ORDER — OXYCODONE HCL 5 MG PO TABS
5.0000 mg | ORAL_TABLET | ORAL | Status: DC | PRN
  Administered 2023-05-29: 5 mg via ORAL
  Filled 2023-05-28: qty 1

## 2023-05-28 MED ORDER — METOPROLOL TARTRATE 5 MG/5ML IV SOLN: 2.5000 mg | INTRAVENOUS | Status: DC | PRN

## 2023-05-28 MED ORDER — METOPROLOL TARTRATE 25 MG/10 ML ORAL SUSPENSION
12.5000 mg | Freq: Two times a day (BID) | ORAL | Status: DC
Start: 2023-05-28 — End: 2023-05-30

## 2023-05-28 MED ORDER — NITROGLYCERIN 0.2 MG/ML ON CALL CATH LAB
INTRAVENOUS | Status: DC | PRN
  Administered 2023-05-28 (×2): 20 ug via INTRAVENOUS
  Administered 2023-05-28: 10 ug via INTRAVENOUS

## 2023-05-28 MED ORDER — BISACODYL 5 MG PO TBEC
10.0000 mg | DELAYED_RELEASE_TABLET | Freq: Every day | ORAL | Status: DC
  Administered 2023-05-29 – 2023-05-31 (×3): 10 mg via ORAL
  Filled 2023-05-28 (×5): qty 2

## 2023-05-28 MED ORDER — PLASMA-LYTE A IV SOLN: INTRAVENOUS | Status: DC | PRN

## 2023-05-28 MED ORDER — ACETAMINOPHEN 500 MG PO TABS
1000.0000 mg | ORAL_TABLET | Freq: Four times a day (QID) | ORAL | Status: DC
  Administered 2023-05-29 – 2023-05-31 (×10): 1000 mg via ORAL
  Filled 2023-05-28 (×10): qty 2

## 2023-05-28 MED ORDER — 0.9 % SODIUM CHLORIDE (POUR BTL) OPTIME
TOPICAL | Status: DC | PRN
  Administered 2023-05-28: 6000 mL

## 2023-05-28 SURGICAL SUPPLY — 87 items
ANTIFOG SOL W/FOAM PAD STRL (MISCELLANEOUS) ×2 IMPLANT
BAG DECANTER FOR FLEXI CONT (MISCELLANEOUS) ×2 IMPLANT
BLADE CLIPPER SURG (BLADE) ×2 IMPLANT
BLADE STERNUM SYSTEM 6 (BLADE) ×2 IMPLANT
BLADE SURG 11 STRL SS (BLADE) IMPLANT
BNDG ELASTIC 4INX 5YD STR LF (GAUZE/BANDAGES/DRESSINGS) IMPLANT
BNDG ELASTIC 6INX 5YD STR LF (GAUZE/BANDAGES/DRESSINGS) ×2 IMPLANT
BNDG GAUZE DERMACEA FLUFF 4 (GAUZE/BANDAGES/DRESSINGS) ×2 IMPLANT
CANISTER SUCT 3000ML PPV (MISCELLANEOUS) ×2 IMPLANT
CANNULA EZ GLIDE AORTIC 21FR (CANNULA) ×2 IMPLANT
CANNULA MC2 2 STG 36/46 CONN (CANNULA) IMPLANT
CATH CPB KIT HENDRICKSON (MISCELLANEOUS) ×2 IMPLANT
CATH ROBINSON RED A/P 18FR (CATHETERS) ×2 IMPLANT
CATH THORACIC 36FR (CATHETERS) ×2 IMPLANT
CATH THORACIC 36FR RT ANG (CATHETERS) ×2 IMPLANT
CLIP FOGARTY SPRING 6M (CLIP) IMPLANT
CLIP TI MEDIUM 24 (CLIP) IMPLANT
CLIP TI WIDE RED SMALL 24 (CLIP) IMPLANT
CONTAINER PROTECT SURGISLUSH (MISCELLANEOUS) ×4 IMPLANT
DERMABOND ADVANCED .7 DNX12 (GAUZE/BANDAGES/DRESSINGS) IMPLANT
DRAPE SRG 135X102X78XABS (DRAPES) ×2 IMPLANT
DRAPE WARM FLUID 44X44 (DRAPES) ×2 IMPLANT
DRSG COVADERM 4X14 (GAUZE/BANDAGES/DRESSINGS) ×2 IMPLANT
ELECT REM PT RETURN 9FT ADLT (ELECTROSURGICAL) ×4 IMPLANT
ELECTRODE REM PT RTRN 9FT ADLT (ELECTROSURGICAL) ×4 IMPLANT
FELT TEFLON 1X6 (MISCELLANEOUS) ×4 IMPLANT
GAUZE 4X4 16PLY ~~LOC~~+RFID DBL (SPONGE) ×2 IMPLANT
GAUZE SPONGE 4X4 12PLY STRL (GAUZE/BANDAGES/DRESSINGS) ×4 IMPLANT
GAUZE SPONGE 4X4 12PLY STRL LF (GAUZE/BANDAGES/DRESSINGS) IMPLANT
GLOVE SS BIOGEL STRL SZ 7.5 (GLOVE) ×2 IMPLANT
GLOVE SURG SIGNA 7.5 PF LTX (GLOVE) ×6 IMPLANT
GOWN STRL REUS W/ TWL LRG LVL3 (GOWN DISPOSABLE) ×8 IMPLANT
GOWN STRL REUS W/ TWL XL LVL3 (GOWN DISPOSABLE) ×4 IMPLANT
GOWN STRL SURGICAL XL XLNG (GOWN DISPOSABLE) IMPLANT
HEMOSTAT POWDER SURGIFOAM 1G (HEMOSTASIS) ×6 IMPLANT
HEMOSTAT SURGICEL 2X14 (HEMOSTASIS) ×2 IMPLANT
INSERT FOGARTY XLG (MISCELLANEOUS) IMPLANT
KIT BASIN OR (CUSTOM PROCEDURE TRAY) ×2 IMPLANT
KIT SUCTION CATH 14FR (SUCTIONS) ×4 IMPLANT
KIT TURNOVER KIT B (KITS) ×2 IMPLANT
KIT VASOVIEW HEMOPRO 2 VH 4000 (KITS) ×2 IMPLANT
MARKER GRAFT CORONARY BYPASS (MISCELLANEOUS) ×6 IMPLANT
NS IRRIG 1000ML POUR BTL (IV SOLUTION) ×10 IMPLANT
PACK E OPEN HEART (SUTURE) ×2 IMPLANT
PACK OPEN HEART (CUSTOM PROCEDURE TRAY) ×2 IMPLANT
PAD ARMBOARD 7.5X6 YLW CONV (MISCELLANEOUS) ×4 IMPLANT
PAD ELECT DEFIB RADIOL ZOLL (MISCELLANEOUS) ×2 IMPLANT
PENCIL BUTTON HOLSTER BLD 10FT (ELECTRODE) ×2 IMPLANT
POSITIONER HEAD DONUT 9IN (MISCELLANEOUS) ×2 IMPLANT
PUNCH AORTIC ROTATE 4.0MM (MISCELLANEOUS) IMPLANT
PUNCH AORTIC ROTATE 4.5MM 8IN (MISCELLANEOUS) IMPLANT
PUNCH AORTIC ROTATE 5MM 8IN (MISCELLANEOUS) IMPLANT
SET MPS 3-ND DEL (MISCELLANEOUS) IMPLANT
SOLUTION ANTFG W/FOAM PAD STRL (MISCELLANEOUS) IMPLANT
SPONGE T-LAP 18X18 ~~LOC~~+RFID (SPONGE) ×8 IMPLANT
SPONGE T-LAP 4X18 ~~LOC~~+RFID (SPONGE) ×2 IMPLANT
STOPCOCK 4 WAY LG BORE MALE ST (IV SETS) IMPLANT
SUPPORT HEART JANKE-BARRON (MISCELLANEOUS) ×2 IMPLANT
SUT BONE WAX W31G (SUTURE) ×2 IMPLANT
SUT ETHIBOND 2 0 SH 36X2 (SUTURE) IMPLANT
SUT MNCRL AB 4-0 PS2 18 (SUTURE) IMPLANT
SUT PROLENE 3 0 SH DA (SUTURE) ×2 IMPLANT
SUT PROLENE 4 0 SH DA (SUTURE) IMPLANT
SUT PROLENE 4-0 RB1 .5 CRCL 36 (SUTURE) IMPLANT
SUT PROLENE 6 0 C 1 30 (SUTURE) ×4 IMPLANT
SUT PROLENE 7 0 BV 1 (SUTURE) IMPLANT
SUT PROLENE 7 0 BV1 MDA (SUTURE) ×2 IMPLANT
SUT PROLENE 8 0 BV175 6 (SUTURE) IMPLANT
SUT SILK 1 MH (SUTURE) IMPLANT
SUT STEEL 6MS V (SUTURE) ×2 IMPLANT
SUT STEEL STERNAL CCS#1 18IN (SUTURE) IMPLANT
SUT STEEL SZ 6 DBL 3X14 BALL (SUTURE) ×2 IMPLANT
SUT VIC AB 1 CTX36XBRD ANBCTR (SUTURE) ×4 IMPLANT
SUT VIC AB 2-0 CT1 TAPERPNT 27 (SUTURE) IMPLANT
SUT VIC AB 2-0 CTX 27 (SUTURE) IMPLANT
SUT VIC AB 3-0 SH 27X BRD (SUTURE) IMPLANT
SUT VIC AB 3-0 X1 27 (SUTURE) IMPLANT
SUT VICRYL 4-0 PS2 18IN ABS (SUTURE) IMPLANT
SYSTEM SAHARA CHEST DRAIN ATS (WOUND CARE) ×2 IMPLANT
TAPE CLOTH SURG 4X10 WHT LF (GAUZE/BANDAGES/DRESSINGS) IMPLANT
TAPE PAPER 2X10 WHT MICROPORE (GAUZE/BANDAGES/DRESSINGS) IMPLANT
TOWEL GREEN STERILE (TOWEL DISPOSABLE) ×2 IMPLANT
TOWEL GREEN STERILE FF (TOWEL DISPOSABLE) ×2 IMPLANT
TRAY FOLEY SLVR 16FR TEMP STAT (SET/KITS/TRAYS/PACK) ×2 IMPLANT
TUBING LAP HI FLOW INSUFFLATIO (TUBING) ×2 IMPLANT
UNDERPAD 30X36 HEAVY ABSORB (UNDERPADS AND DIAPERS) ×2 IMPLANT
WATER STERILE IRR 1000ML POUR (IV SOLUTION) ×4 IMPLANT

## 2023-05-28 NOTE — Hospital Course (Addendum)
 Referring:  End, Cristal Deer, MD  PCP: Sherlene Shams, MD  HPI: This is a 73 year old man with a history of hypertension, hyperlipidemia, mild aortic insufficiency, left main and three-vessel coronary disease, right bundle branch block, exertional angina, single kidney, renal artery stenosis, renovascular hypertension, cervical disc disease, and arthritis.  He was admitted to the hospital in November.  He had a CT for coronary calcium scoring which showed a score of 1066 (86 percentile).  He was having intermittent chest tightness which radiated to his throat and shortness of breath with exertion.  He was not having any rest symptoms.  He underwent cardiac catheterization which showed an 80 to 90% distal left main stenosis with chronic total occlusion of his RCA.  He was advised to undergo coronary artery bypass grafting.  However, he decided to go home without having the procedure.  We have called him several times and he finally agreed to come in and further discuss surgery.   He continues to have some shortness of breath and chest tightness with exertion.  Relieved with rest.  Has not used nitroglycerin.  He complains of some swelling and pain in his hands.  He has also been having some back pain.  Patient has eft main and three-vessel coronary disease-with stable exertional angina.  Coronary bypass graft is indicated for survival benefit and relief of symptoms. Dr. Dorris Fetch discussed possible complications, benefits, and risks with the patient. He agreed to proceed with surgery. Pre operative carotid duplex US showed no significant internal carotid artery stenosis bilaterally.  Hospital Course: Patient underwent a CABG x 4 utilizing LIMA to LAD, SVG to distal LAD, SVG to RCA and SVG to OM as well as RCA endarterectomy and endoscopic right greater saphenous vein harvest. He was transported from the OR to 2H ICU in stable condition. He was extubated by 5 PM on the day of surgery.  Vital signs,  hemodynamics, and respiratory status are remained stable.  Mild hypotension related to vasodilation was managed with IV Neo-Synephrine early postoperatively.  This was weaned off on the first postoperative day.  The monitoring lines and chest tubes were removed on postop day 1.  He was mobilized routinely.  By the second postoperative day, blood pressure was stable off of the Neo-Synephrine.  He remained in a stable sinus rhythm so the pacer wires were removed on postop day 2.  He had expected acute blood loss anemia with a hemoglobin at 7 g.  This was well-tolerated and was monitored.  Daily subcutaneous enoxaparin was added for DVT prophylaxis.  He was ready for transfer to 4E Progressive [facility] on the second postop day. He was routinely diuresed. Lopressor was titrated as able. He had postoperative acute blood loss anemia, iron supplement was started.

## 2023-05-28 NOTE — Anesthesia Postprocedure Evaluation (Signed)
 Anesthesia Post Note  Patient: Frederick Pace.  Procedure(s) Performed: CORONARY ARTERY BYPASS GRAFTING TIMES FOUR USING LEFT INTERNAL MAMMARY ARTERY AND ENDOSCOPICALLY HARVESTED RIGHT GREAT SAPHENOUS VEIN; RIGHT CORONARY ENDARTERECTOMY (Chest) ECHOCARDIOGRAM, TRANSESOPHAGEAL     Patient location during evaluation: SICU Anesthesia Type: General Level of consciousness: sedated Pain management: pain level controlled Vital Signs Assessment: post-procedure vital signs reviewed and stable Respiratory status: patient remains intubated per anesthesia plan Cardiovascular status: stable Postop Assessment: no apparent nausea or vomiting Anesthetic complications: no  No notable events documented.  Last Vitals:  Vitals:   05/28/23 0647  BP: 126/75  Pulse: 80  Resp: 18  Temp: 36.7 C  SpO2: 96%    Last Pain:  Vitals:   05/28/23 0706  TempSrc:   PainSc: 0-No pain                 Shelton Silvas

## 2023-05-28 NOTE — Transfer of Care (Signed)
 Immediate Anesthesia Transfer of Care Note  Patient: Frederick Pace.  Procedure(s) Performed: CORONARY ARTERY BYPASS GRAFTING TIMES FOUR USING LEFT INTERNAL MAMMARY ARTERY AND ENDOSCOPICALLY HARVESTED RIGHT GREAT SAPHENOUS VEIN; RIGHT CORONARY ENDARTERECTOMY (Chest) ECHOCARDIOGRAM, TRANSESOPHAGEAL  Patient Location: SICU  Anesthesia Type:General  Level of Consciousness: Patient remains intubated per anesthesia plan  Airway & Oxygen Therapy: Patient remains intubated per anesthesia plan  Post-op Assessment: Report given to RN and Post -op Vital signs reviewed and stable  Post vital signs: Reviewed and stable  Last Vitals:  Vitals Value Taken Time  BP 120/80   Temp 98   Pulse 75   Resp 16   SpO2 100     Last Pain:  Vitals:   05/28/23 0706  TempSrc:   PainSc: 0-No pain         Complications: No notable events documented.

## 2023-05-28 NOTE — Brief Op Note (Signed)
 05/28/2023  2:27 PM  PATIENT:  Frederick Pace.  73 y.o. male  PRE-OPERATIVE DIAGNOSIS:  Coronary Artery Disease LMD  POST-OPERATIVE DIAGNOSIS:  Cornary Artery Disease LMD  PROCEDURE:  Procedure(s): CORONARY ARTERY BYPASS GRAFTING TIMES FOUR USING LEFT INTERNAL MAMMARY ARTERY AND ENDOSCOPICALLY HARVESTED RIGHT GREAT SAPHENOUS VEIN; RIGHT CORONARY ENDARTERECTOMY (N/A) ECHOCARDIOGRAM, TRANSESOPHAGEAL (N/A) -LIMA to LAD -SVG to distal LAD -SVG to OM -SVG to RCA with endarterectomy  Vein harvest time: Vein prep time:  SURGEON:  Surgeons and Role:    * Loreli Slot, MD - Primary  PHYSICIAN ASSISTANT: Aloha Gell PA-C  ASSISTANTS: Dorthey Sawyer RNFA   ANESTHESIA:   general  EBL:  690 mL   BLOOD ADMINISTERED: - CC CELLSAVER  DRAINS:  mediastinal drains    LOCAL MEDICATIONS USED:  NONE  SPECIMEN:  Source of Specimen:  RCA endarterectomy  DISPOSITION OF SPECIMEN:  PATHOLOGY  COUNTS:  YES  DICTATION: .Dragon Dictation  PLAN OF CARE: Admit to inpatient   PATIENT DISPOSITION:  ICU - intubated and hemodynamically stable.   Delay start of Pharmacological VTE agent (>24hrs) due to surgical blood loss or risk of bleeding: yes

## 2023-05-28 NOTE — Anesthesia Procedure Notes (Signed)
 Arterial Line Insertion Start/End3/07/2023 8:00 AM, 05/28/2023 8:10 AM Performed by: CRNA  Patient location: Pre-op. Preanesthetic checklist: patient identified, IV checked, site marked, risks and benefits discussed, surgical consent, monitors and equipment checked, pre-op evaluation, timeout performed and anesthesia consent Lidocaine 1% used for infiltration Left, radial was placed Catheter size: 20 G Hand hygiene performed  and maximum sterile barriers used   Attempts: 2 Procedure performed without using ultrasound guided technique. Following insertion, dressing applied and Biopatch. Post procedure assessment: normal and unchanged  Post procedure complications: unsuccessful attempts. Patient tolerated the procedure well with no immediate complications.

## 2023-05-28 NOTE — Anesthesia Procedure Notes (Signed)
 Central Venous Catheter Insertion Performed by: Shelton Silvas, MD, anesthesiologist Start/End3/07/2023 7:55 AM, 05/28/2023 8:00 AM Patient location: Pre-op. Preanesthetic checklist: patient identified, IV checked, site marked, risks and benefits discussed, surgical consent, monitors and equipment checked, pre-op evaluation, timeout performed and anesthesia consent Hand hygiene performed  and maximum sterile barriers used  PA cath was placed.Swan type:thermodilution Procedure performed without using ultrasound guided technique. Attempts: 1 Patient tolerated the procedure well with no immediate complications.

## 2023-05-28 NOTE — Anesthesia Procedure Notes (Signed)
 Procedure Name: Intubation Date/Time: 05/28/2023 8:45 AM  Performed by: Darryl Nestle, CRNAPre-anesthesia Checklist: Patient identified, Emergency Drugs available, Suction available and Patient being monitored Patient Re-evaluated:Patient Re-evaluated prior to induction Oxygen Delivery Method: Circle system utilized Preoxygenation: Pre-oxygenation with 100% oxygen Induction Type: IV induction Ventilation: Mask ventilation without difficulty Laryngoscope Size: Mac, 3 and Glidescope Grade View: Grade I Tube type: Oral Tube size: 8.0 mm Number of attempts: 2 Airway Equipment and Method: Stylet and Oral airway Placement Confirmation: ETT inserted through vocal cords under direct vision, positive ETCO2 and breath sounds checked- equal and bilateral Secured at: 23 cm Tube secured with: Tape Dental Injury: Teeth and Oropharynx as per pre-operative assessment  Difficulty Due To: Difficult Airway- due to anterior larynx Comments: Attempt 1 Mac 3 DL grade 3 view, no attempt to intubate Attempt 2 mac 3 glidescope grade 1 view, successful intubation

## 2023-05-28 NOTE — Interval H&P Note (Signed)
 History and Physical Interval Note:  05/28/2023 8:12 AM  Frederick Pace.  has presented today for surgery, with the diagnosis of CAD LMD.  The various methods of treatment have been discussed with the patient and family. After consideration of risks, benefits and other options for treatment, the patient has consented to  Procedure(s): CABG, OPEN HEART (N/A) ECHOCARDIOGRAM, TRANSESOPHAGEAL (N/A) as a surgical intervention.  The patient's history has been reviewed, patient examined, no change in status, stable for surgery.  I have reviewed the patient's chart and labs.  Questions were answered to the patient's satisfaction.     Loreli Slot

## 2023-05-28 NOTE — Anesthesia Preprocedure Evaluation (Addendum)
 Anesthesia Evaluation  Patient identified by MRN, date of birth, ID band Patient awake    Reviewed: Allergy & Precautions, NPO status , Patient's Chart, lab work & pertinent test results  Airway Mallampati: III  TM Distance: <3 FB Neck ROM: Full    Dental  (+) Teeth Intact, Dental Advisory Given, Chipped   Pulmonary neg pulmonary ROS   breath sounds clear to auscultation       Cardiovascular hypertension, + CAD and + Peripheral Vascular Disease  + dysrhythmias  Rhythm:Regular Rate:Normal  Echo:  1. Left ventricular ejection fraction, by estimation, is 60 to 65%. The  left ventricle has normal function. The left ventricle has no regional  wall motion abnormalities. There is mild concentric left ventricular  hypertrophy. Left ventricular diastolic  parameters are consistent with Grade I diastolic dysfunction (impaired  relaxation). The average left ventricular global longitudinal strain is  -16.3 %. The global longitudinal strain is abnormal.   2. Right ventricular systolic function is normal. The right ventricular  size is normal. There is normal pulmonary artery systolic pressure. The  estimated right ventricular systolic pressure is 23.1 mmHg.   3. The mitral valve is normal in structure. No evidence of mitral valve  regurgitation. No evidence of mitral stenosis.   4. The aortic valve is tricuspid. There is moderate calcification of the  aortic valve. Aortic valve regurgitation is mild. Aortic valve  sclerosis/calcification is present, without any evidence of aortic  stenosis.   5. Aortic dilatation noted. There is mild dilatation of the aortic root,  measuring 41 mm.   6. The inferior vena cava is normal in size with greater than 50%  respiratory variability, suggesting right atrial pressure of 3 mmHg.     Neuro/Psych  Neuromuscular disease  negative psych ROS   GI/Hepatic negative GI ROS, Neg liver ROS,,,  Endo/Other   negative endocrine ROS    Renal/GU Renal disease     Musculoskeletal  (+) Arthritis ,    Abdominal   Peds  Hematology negative hematology ROS (+)   Anesthesia Other Findings   Reproductive/Obstetrics                             Anesthesia Physical Anesthesia Plan  ASA: 4  Anesthesia Plan: General   Post-op Pain Management: Ofirmev IV (intra-op)*   Induction: Intravenous  PONV Risk Score and Plan: Ondansetron and Midazolam  Airway Management Planned: Oral ETT  Additional Equipment: Arterial line, CVP, PA Cath, TEE and Ultrasound Guidance Line Placement  Intra-op Plan:   Post-operative Plan: Post-operative intubation/ventilation  Informed Consent: I have reviewed the patients History and Physical, chart, labs and discussed the procedure including the risks, benefits and alternatives for the proposed anesthesia with the patient or authorized representative who has indicated his/her understanding and acceptance.     Dental advisory given  Plan Discussed with: CRNA  Anesthesia Plan Comments:        Anesthesia Quick Evaluation

## 2023-05-28 NOTE — Anesthesia Procedure Notes (Signed)
 Central Venous Catheter Insertion Performed by: Shelton Silvas, MD, anesthesiologist Start/End3/07/2023 7:45 AM, 05/28/2023 7:55 AM Patient location: Pre-op. Preanesthetic checklist: patient identified, IV checked, site marked, risks and benefits discussed, surgical consent, monitors and equipment checked, pre-op evaluation, timeout performed and anesthesia consent Position: Trendelenburg Lidocaine 1% used for infiltration and patient sedated Hand hygiene performed , maximum sterile barriers used  and Seldinger technique used Catheter size: 8.5 Fr Total catheter length 10. Central line was placed.Sheath introducer Swan type:thermodilution PA Cath depth:50 Procedure performed using ultrasound guided technique. Ultrasound Notes:anatomy identified, needle tip was noted to be adjacent to the nerve/plexus identified, no ultrasound evidence of intravascular and/or intraneural injection and image(s) printed for medical record Attempts: 1 Following insertion, line sutured, dressing applied and Biopatch. Post procedure assessment: blood return through all ports, free fluid flow and no air  Patient tolerated the procedure well with no immediate complications.

## 2023-05-28 NOTE — Discharge Instructions (Signed)

## 2023-05-28 NOTE — Progress Notes (Signed)
 Patient ID: Frederick Stucki., male   DOB: [birth year], 72 y.o.   MRN: [id number]   [facility] Evening Rounds:   Hemodynamically stable  CI = 2.1  Extubated  Urine output good  CT output low  CBC    Component Value Date/Time   WBC 8.7 05/27/2023 1019   RBC 4.91 05/27/2023 1019   HGB 9.2 (L) 05/28/2023 1336   HGB 15.7 02/06/2023 1008   HCT 27.0 (L) 05/28/2023 1336   HCT 47.3 02/06/2023 1008   PLT 174 05/28/2023 1230   PLT 234 02/06/2023 1008   MCV 91.0 05/27/2023 1019   MCV 92 02/06/2023 1008   MCH 30.3 05/27/2023 1019   MCHC 33.3 05/27/2023 1019   RDW 13.1 05/27/2023 1019   RDW 12.2 02/06/2023 1008   LYMPHSABS 1.5 02/24/2023 1142   MONOABS 1.1 (H) 02/24/2023 1142   EOSABS 0.3 02/24/2023 1142   BASOSABS 0.0 02/24/2023 1142     BMET    Component Value Date/Time   NA 139 05/28/2023 1336   NA 138 02/06/2023 1008   K 4.3 05/28/2023 1336   CL 105 05/28/2023 1333   CO2 27 05/27/2023 1019   GLUCOSE 140 (H) 05/28/2023 1333   BUN 15 05/28/2023 1333   BUN 15 02/06/2023 1008   CREATININE 0.80 05/28/2023 1333   CREATININE 1.00 11/21/2017 1559   CALCIUM 9.7 05/27/2023 1019   EGFR 71 02/06/2023 1008   GFRNONAA >60 05/27/2023 1019     A/P:  Stable postop course. Continue current plans

## 2023-05-29 ENCOUNTER — Inpatient Hospital Stay (HOSPITAL_COMMUNITY)

## 2023-05-29 ENCOUNTER — Encounter (HOSPITAL_COMMUNITY): Payer: Self-pay | Admitting: Thoracic Surgery (Cardiothoracic Vascular Surgery)

## 2023-05-29 LAB — GLUCOSE, CAPILLARY
Glucose-Capillary: 105 mg/dL — ABNORMAL HIGH (ref 70–99)
Glucose-Capillary: 108 mg/dL — ABNORMAL HIGH (ref 70–99)
Glucose-Capillary: 113 mg/dL — ABNORMAL HIGH (ref 70–99)
Glucose-Capillary: 114 mg/dL — ABNORMAL HIGH (ref 70–99)
Glucose-Capillary: 114 mg/dL — ABNORMAL HIGH (ref 70–99)
Glucose-Capillary: 118 mg/dL — ABNORMAL HIGH (ref 70–99)
Glucose-Capillary: 123 mg/dL — ABNORMAL HIGH (ref 70–99)
Glucose-Capillary: 127 mg/dL — ABNORMAL HIGH (ref 70–99)
Glucose-Capillary: 138 mg/dL — ABNORMAL HIGH (ref 70–99)
Glucose-Capillary: 92 mg/dL (ref 70–99)

## 2023-05-29 LAB — CBC
HCT: 23.6 % — ABNORMAL LOW (ref 39.0–52.0)
HCT: 21.8 % — ABNORMAL LOW (ref 39.0–52.0)
Hemoglobin: 7.8 g/dL — ABNORMAL LOW (ref 13.0–17.0)
Hemoglobin: 7.1 g/dL — ABNORMAL LOW (ref 13.0–17.0)
MCH: 30.5 pg (ref 26.0–34.0)
MCH: 31 pg (ref 26.0–34.0)
MCHC: 33.1 g/dL (ref 30.0–36.0)
MCHC: 32.6 g/dL (ref 30.0–36.0)
MCV: 92.2 fL (ref 80.0–100.0)
MCV: 95.2 fL (ref 80.0–100.0)
Platelets: 149 10*3/uL — ABNORMAL LOW (ref 150–400)
Platelets: 115 10*3/uL — ABNORMAL LOW (ref 150–400)
RBC: 2.56 MIL/uL — ABNORMAL LOW (ref 4.22–5.81)
RBC: 2.29 MIL/uL — ABNORMAL LOW (ref 4.22–5.81)
RDW: 13.4 % (ref 11.5–15.5)
RDW: 13.7 % (ref 11.5–15.5)
WBC: 12.5 10*3/uL — ABNORMAL HIGH (ref 4.0–10.5)
WBC: 10.5 10*3/uL (ref 4.0–10.5)
nRBC: 0 % (ref 0.0–0.2)
nRBC: 0 % (ref 0.0–0.2)

## 2023-05-29 LAB — BASIC METABOLIC PANEL
Anion gap: 5 (ref 5–15)
Anion gap: 4 — ABNORMAL LOW (ref 5–15)
BUN: 14 mg/dL (ref 8–23)
BUN: 17 mg/dL (ref 8–23)
CO2: 19 mmol/L — ABNORMAL LOW (ref 22–32)
CO2: 21 mmol/L — ABNORMAL LOW (ref 22–32)
Calcium: 6.9 mg/dL — ABNORMAL LOW (ref 8.9–10.3)
Calcium: 8.1 mg/dL — ABNORMAL LOW (ref 8.9–10.3)
Chloride: 111 mmol/L (ref 98–111)
Chloride: 110 mmol/L (ref 98–111)
Creatinine, Ser: 1.13 mg/dL (ref 0.61–1.24)
Creatinine, Ser: 1.21 mg/dL (ref 0.61–1.24)
GFR, Estimated: >60 mL/min (ref >60)
GFR, Estimated: >60 mL/min (ref >60)
Glucose, Bld: 112 mg/dL — ABNORMAL HIGH (ref 70–99)
Glucose, Bld: 134 mg/dL — ABNORMAL HIGH (ref 70–99)
Potassium: 3.7 mmol/L (ref 3.5–5.1)
Potassium: 4.1 mmol/L (ref 3.5–5.1)
Sodium: 135 mmol/L (ref 135–145)
Sodium: 135 mmol/L (ref 135–145)

## 2023-05-29 LAB — SURGICAL PATHOLOGY

## 2023-05-29 LAB — MAGNESIUM
Magnesium: 2.3 mg/dL (ref 1.7–2.4)
Magnesium: 2.2 mg/dL (ref 1.7–2.4)

## 2023-05-29 MED ORDER — ALBUMIN HUMAN 5 % IV SOLN
12.5000 g | Freq: Once | INTRAVENOUS | Status: AC
  Administered 2023-05-29: 12.5 g via INTRAVENOUS

## 2023-05-29 MED ORDER — ALBUMIN HUMAN 5 % IV SOLN
INTRAVENOUS | Status: AC
Start: 2023-05-29 — End: 2023-05-29
  Filled 2023-05-29: qty 250

## 2023-05-29 MED ORDER — INSULIN ASPART 100 UNIT/ML IJ SOLN
0.0000 [IU] | INTRAMUSCULAR | Status: DC
  Administered 2023-05-29 – 2023-05-30 (×2): 2 [IU] via SUBCUTANEOUS

## 2023-05-29 MED ORDER — SODIUM CHLORIDE 0.9 % IV SOLN
INTRAVENOUS | Status: DC | PRN
Start: 2023-05-29 — End: 2023-05-30

## 2023-05-29 MED ORDER — ENOXAPARIN SODIUM 40 MG/0.4ML IJ SOSY
40.0000 mg | PREFILLED_SYRINGE | Freq: Every day | INTRAMUSCULAR | Status: DC
  Administered 2023-05-29 – 2023-06-01 (×4): 40 mg via SUBCUTANEOUS
  Filled 2023-05-29 (×4): qty 0.4

## 2023-05-29 MED ORDER — POTASSIUM CHLORIDE CRYS ER 20 MEQ PO TBCR
20.0000 meq | EXTENDED_RELEASE_TABLET | ORAL | Status: AC
  Administered 2023-05-29 (×3): 20 meq via ORAL
  Filled 2023-05-29 (×3): qty 1

## 2023-05-29 MED ORDER — SODIUM CHLORIDE 0.9 % IV SOLN: 1.0000 g | Freq: Once | INTRAVENOUS | Status: DC

## 2023-05-29 MED ORDER — CALCIUM GLUCONATE-NACL 1-0.675 GM/50ML-% IV SOLN
1.0000 g | Freq: Once | INTRAVENOUS | Status: AC
  Administered 2023-05-29: 1000 mg via INTRAVENOUS
  Filled 2023-05-29: qty 50

## 2023-05-29 NOTE — Progress Notes (Signed)
 1 Day Post-Op Procedure(s) (LRB): CORONARY ARTERY BYPASS GRAFTING TIMES FOUR USING LEFT INTERNAL MAMMARY ARTERY AND ENDOSCOPICALLY HARVESTED RIGHT GREAT SAPHENOUS VEIN; RIGHT CORONARY ENDARTERECTOMY (N/A) ECHOCARDIOGRAM, TRANSESOPHAGEAL (N/A) Subjective: C/o fingers being swollen (predated surgery, worse now)  Objective: Vital signs in last 24 hours: Temp:  [96.6 F (35.9 C)-100.8 F (38.2 C)] 100 F (37.8 C) (03/06 0700) Pulse Rate:  [54-130] 73 (03/06 0700) Cardiac Rhythm: Normal sinus rhythm (03/05 2100) Resp:  [12-21] 16 (03/06 0700) BP: (75-106)/(50-66) 104/60 (03/06 0700) SpO2:  [91 %-100 %] 96 % (03/06 0700) Arterial Line BP: (81-134)/(33-65) 109/57 (03/06 0530) FiO2 (%):  [40 %-50 %] 40 % (03/05 1511) Weight:  [92.5 kg] 92.5 kg (03/06 0515)  Hemodynamic parameters for last 24 hours: PAP: (21-46)/(4-28) 28/13 CO:  [3.6 L/min-8.3 L/min] 5.4 L/min CI:  [1.88 L/min/m2-4.27 L/min/m2] 2.8 L/min/m2  Intake/Output from previous day: 03/05 0701 - 03/06 0700 In: 7971.6 [P.O.:150; I.V.:5283.1; IV Piggyback:2538.5] Out: 4635 [Urine:3455; Blood:690; Chest Tube:490] Intake/Output this shift: No intake/output data recorded.  General appearance: alert, cooperative, and no distress Neurologic: intact Heart: regular rate and rhythm Lungs: diminished breath sounds bibasilar Abdomen: normal findings: soft, non-tender  Lab Results: Recent Labs    05/28/23 2030 05/29/23 0509  WBC 12.8* 12.5*  HGB 8.6* 7.8*  HCT 25.7* 23.6*  PLT 143* 149*   BMET:  Recent Labs    05/28/23 2030 05/29/23 0509  NA 139 135  K 4.3 3.7  CL 108 111  CO2 19* 19*  GLUCOSE 130* 112*  BUN 15 14  CREATININE 1.06 1.13  CALCIUM 7.4* 6.9*    PT/INR:  Recent Labs    05/28/23 1443  LABPROT 19.3*  INR 1.6*   ABG    Component Value Date/Time   PHART 7.333 (L) 05/28/2023 1658   HCO3 22.9 05/28/2023 1658   TCO2 24 05/28/2023 1658   ACIDBASEDEF 3.0 (H) 05/28/2023 1658   O2SAT 97 05/28/2023 1658    CBG (last 3)  Recent Labs    05/29/23 0304 05/29/23 0402 05/29/23 0505  GLUCAP 114* 105* 114*    Assessment/Plan: S/P Procedure(s) (LRB): CORONARY ARTERY BYPASS GRAFTING TIMES FOUR USING LEFT INTERNAL MAMMARY ARTERY AND ENDOSCOPICALLY HARVESTED RIGHT GREAT SAPHENOUS VEIN; RIGHT CORONARY ENDARTERECTOMY (N/A) ECHOCARDIOGRAM, TRANSESOPHAGEAL (N/A) POD # 1 Doing well NEURO- intact CV- in Sr with good cardiac output  Vasodilated requiring relatively high dose of phenylephrine  Low filling pressure  Will give albumin + calcium, wean neo  ASA, add Plavix after pacing wires out  Statin, beta blocker RESP- IS for atelectasis RENAL- single kidney Creatinine normal, supplement K Will need diuresis ENDO- CBG well controlled GI- advance diet as tolerated Anemia secondary to ABL  Hgb 7.8- monitor SCD + enoxaparin for DVT prophylaxis Dc chest tubes Cardiac rehab    LOS: 1 day    Loreli Slot 05/29/2023

## 2023-05-29 NOTE — TOC Initial Note (Signed)
 Transition of Care (TOC) - Initial/Assessment Note    Patient Details  Name: Frederick Pace. MRN: [id number] Date of Birth: 1951/02/24  Transition of Care [facility]) CM/SW Contact:    Elliot Cousin, RN Phone Number: [phone] 05/29/2023, 2:53 PM  Clinical Narrative:                  Pt properatively arranged with [employer] for [facility]. Medicare.gov list with rating placed on chart and provided to patient. Will continue to follow for dc needs.    Expected Discharge Plan: Home w Home Health Services Barriers to Discharge: Continued Medical Work up   Patient Goals and CMS Choice   CMS Medicare.gov Compare Post Acute Care list provided to:: Patient Choice offered to / list presented to : Patient [place] ownership interest in [facility].provided to:: Patient    Expected Discharge Plan and Services   Discharge Planning Services: CM Consult Post Acute Care Choice: Home Health Living arrangements for the past 2 months: Single Family Home                           HH Arranged: PT          Prior Living Arrangements/Services Living arrangements for the past 2 months: Single Family Home Lives with:: Self Patient language and need for interpreter reviewed:: Yes Do you feel safe going back to the place where you live?: Yes      Need for Family Participation in Patient Care: Yes (Comment) Care giver support system in place?: Yes (comment)   Criminal Activity/Legal Involvement Pertinent to Current Situation/Hospitalization: No - Comment as needed  Activities of Daily Living   ADL Screening (condition at time of admission) Independently performs ADLs?: Yes (appropriate for developmental age) Is the patient deaf or have difficulty hearing?: No Does the patient have difficulty seeing, even when wearing glasses/contacts?: No Does the patient have difficulty concentrating, remembering, or making decisions?: No  Permission Sought/Granted Permission sought to  share information with : Case Manager, Family Supports, PCP Permission granted to share information with : Yes, Verbal Permission Granted  Share Information with NAME: Tenna Child     Permission granted to share info w Relationship: sister  Permission granted to share info w Contact Information: [phone]  Emotional Assessment Appearance:: Appears stated age Attitude/Demeanor/Rapport: Lethargic          Admission diagnosis:  S/P CABG x 4 [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] Patient Active Problem List   Diagnosis Date Noted   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   Statin myopathy 03/12/2023   Nonrheumatic aortic valve insufficiency 03/12/2023   Rash of body 02/24/2023   Hospital discharge follow-up 02/24/2023   CAD (coronary artery disease) 02/18/2023   Accelerating angina (HCC) 02/06/2023   Pure hypercholesterolemia 01/03/2022   Right bundle branch block 01/03/2022   Arthritis of right hand 06/11/2020   White coat syndrome with hypertension 06/11/2020   Prostate cancer screening 07/25/2019   Solitary kidney 02/13/2018   Renovascular hypertension 12/12/2017   Abnormal finding on diagnostic imaging of left kidney 11/30/2017   Renal artery stenosis (HCC) 11/30/2017   Diuretic-induced hypokalemia 11/23/2017   Mitral valve insufficiency and aortic valve insufficiency 11/23/2017   Obesity (BMI 30-39.9) 11/23/2017   Myalgia 11/02/2015   Arthralgia 11/02/2015   Visit for preventive health examination 05/30/2015   Cervical disc disorder 12/14/2014   Vaccine counseling 06/22/2014   Hyperlipidemia LDL goal <70 05/11/2009   Essential hypertension 05/11/2009  PCP:  Sherlene Shams, MD Pharmacy:   [facility] - [place], [place] - [address] [place] [address] Phone: [phone] Fax: [phone]     Social Drivers of Health (SDOH) Social History: SDOH Screenings   Food Insecurity: No Food Insecurity (05/29/2023)  Housing: Low Risk  (05/29/2023)  Transportation Needs: No  Transportation Needs (05/29/2023)  Utilities: Not At Risk (05/29/2023)  Alcohol Screen: Low Risk  (08/13/2022)  Depression (PHQ2-9): Low Risk  (02/24/2023)  Financial Resource Strain: Low Risk  (08/13/2022)  Physical Activity: Insufficiently Active (08/13/2022)  Social Connections: Socially Isolated (08/13/2022)  Stress: No Stress Concern Present (08/13/2022)  Tobacco Use: Low Risk  (05/28/2023)   SDOH Interventions: Food Insecurity Interventions: Intervention Not Indicated Housing Interventions: Intervention Not Indicated Transportation Interventions: Intervention Not Indicated Utilities Interventions: Intervention Not Indicated   Readmission Risk Interventions     No data to display

## 2023-05-29 NOTE — Op Note (Signed)
 NAMEJEF, Pace MEDICAL RECORD NO: [id number] ACCOUNT NO: [id number] DATE OF BIRTH: 1950-12-23 FACILITY: [facility] LOCATION: [facility] PHYSICIAN: Salvatore Decent. Dorris Fetch, MD  Operative Report   DATE OF PROCEDURE: 05/28/2023  PREOPERATIVE DIAGNOSIS:  Left main and three-vessel coronary artery disease.  POSTOPERATIVE DIAGNOSIS:  Left main and three-vessel coronary artery disease.  PROCEDURE:   Median sternotomy, extracorporeal circulation,  Coronary artery bypass grafting x 4  Left internal mammary artery to LAD,  Saphenous vein graft to distal LAD,  Saphenous vein graft to obtuse marginal,  Saphenous vein graft to distal right coronary artery with endarterectomy.   Endoscopic vein harvest, right leg.  SURGEON:  Salvatore Decent. Dorris Fetch, MD  ASSISTANT:  Aloha Gell, PA  Experienced assistance was necessary for this case due to surgical complexity.  Aloha Gell, [place], assisted with independently harvesting the saphenous vein endoscopically from the right leg and closure of the leg incisions.  She also assisted with exposure, retraction of delicate tissues, suture management, and suctioning during the anastomosis.   FINDINGS:  Transesophageal echocardiography showed preserved left ventricular systolic function, mild aortic insufficiency, no significant mitral insufficiency.  Post bypass study unchanged.  LAD diffusely diseased.  Right coronary branches not graftable.  Right coronary heavily diseased, endarterectomy performed with good tapering plaque both proximally and distally.  OM intramyocardial, saphenous vein and mammary artery good quality.  CLINICAL NOTE:  Frederick Pace is a 73 year old man with multiple cardiac risk factors who had presented with an elevated coronary calcium score along with intermittent anginal symptoms.  At catheterization, he was found to have left main and three-vessel disease and was advised to undergo coronary bypass grafting.  He decided to not have surgery at  that time and has been managed medically since then.  He now returns and is ready to proceed with surgery.  The indications, risks, benefits, and alternatives were discussed in detail with the patient.  He understood and accepted the risks and agreed to proceed.  OPERATIVE NOTE:  Frederick Pace was brought to the preoperative holding area on 05/28/2023.  Anesthesia placed a Swan-Ganz catheter and an arterial blood pressure monitor line.  He was taken to the operating room, anesthetized and intubated.  A Foley catheter was placed.  Intravenous antibiotics were administered.  Transesophageal echocardiography was performed by Dr. Shona Simpson of anesthesiology.  Please see his separately dictated note for full details of the procedure.  Findings were as noted above.  The chest, abdomen, and legs were prepped and draped in the usual sterile fashion.  A time-out was performed.  An incision was made by Aloha Gell in the medial aspect of the right leg.  The greater saphenous vein was identified.  It was harvested endoscopically from the mid calf to the groin.  The vein was of good quality for the patient's age.  Simultaneously, a median sternotomy was performed and the left internal mammary artery was harvested using standard technique.  The mammary was a good quality vessel with excellent flow when divided distally.  2000 units of heparin was administered during the vessel harvest.  The remainder of the full heparin dose was given prior to cannulating the aorta.  A sternal retractor was placed and was gradually opened over time.  The anticoagulation was confirmed with ACT measurement.  The aorta was cannulated via concentric 2-0 Ethibond pledget pursestring sutures.  A dual-stage venous cannula was placed via a pursestring suture with a radial appendage.  Cardiopulmonary bypass was initiated.  Flows were maintained per protocol.  The  patient was cooled to 32 degrees Celsius.  The coronary arteries were inspected and  anastomotic sites were chosen.  The conduits were inspected and cut to length.  A foam pad was placed in the pericardium to insulate the heart.  A temperature probe was placed in the myocardial septum and a cardioplegia cannula was placed in the ascending aorta.  The aorta was cross-clamped.  The left ventricle was emptied via the aortic root vent.  Cardiac arrest was achieved with a combination of cold antegrade blood cardioplegia and topical ice saline.  1.5 L of cardioplegia was administered.  There was diastolic arrest with 500 mL of cardioplegia and septal cooling to 11 degrees Celsius with the remainder of the cardioplegia.    A reversed saphenous vein graft was placed end-to-side to the distal LAD.  The LAD was a diffusely diseased vessel.  Distally near the apex, there was a segment that was graftable.  The vein graft was anastomosed end-to-side with a running 7-0 Prolene suture.  A probe did pass distally from the anastomosis.  Cardioplegia was administered and there was good flow and good hemostasis.  Next, the heart was elevated exposing the lateral wall.  OM1 was too small to graft.  OM2 was a large lateral branch.  It was intramyocardial.  It was a 2 mm good quality target at the site of the anastomosis.  The vein graft was of good quality.  It was anastomosed end-to-side with a running 7-0 Prolene suture.  A probe did pass proximally and distally.  Cardioplegia was administered down the graft and there was good flow and good hemostasis.  Next, the right coronary artery was exposed.  An arteriotomy was made.  The plaque then was gently dissected off the wall of the vessel and an endarterectomy was performed.  There was a good tapering plaque both proximally and distally.  The vessel was irrigated with heparinized saline.  A reversed saphenous vein graft was sewn end-to-side to the distal right coronary with a running 7-0 Prolene suture.  Cardioplegia was administered down the graft with good  flow and good hemostasis.  Additional cardioplegia was also administered via the aortic root.  The left internal mammary artery was brought through a window in the pericardium.  The distal end was beveled.  It was then anastomosed end-to-side to the mid LAD.  It was partially intramyocardial.  There was plaque throughout the LAD, but there was a relatively tight plaque between the mammary and the saphenous vein.  The mammary to LAD anastomosis was performed with a running 8-0 Prolene suture.  At the completion of the anastomosis, the Bulldog clamp was removed.  Septal rewarming was noted.  The Bulldog clamp was replaced and the mammary pedicle was tacked to the epicardial surface of the heart with 6-0 Prolene sutures.  Additional cardioplegia was administered.  The vein grafts were cut to length.  The cardioplegia cannula was removed from the ascending aorta.  The proximal vein graft anastomoses were performed to 4.0 mm punch aortotomies with running 6-0 Prolene sutures.  At the completion of the final proximal anastomosis, the patient was placed in Trendelenburg position.  Lidocaine was administered.  The bulldog clamp was again removed from the left mammary artery.  De-airing was performed and the aortic cross clamp was removed.  The total cross clamp time was 94 minutes.  While re-warming was completed, all proximal and distal anastomoses were inspected for hemostasis.  A single defibrillation with 10 joules resulted in sinus rhythm.  Epicardial pacing wires were placed on the right ventricle and right atrium.  When the patient had rewarmed to a core temperature of 37 degrees Celsius, he was weaned from cardiopulmonary bypass on the first attempt.  The total bypass time was 127 minutes.  He was in sinus rhythm and on no inotropic support at the time of separation from bypass.  The initial cardiac index was 2 liters per minute per meter squared and the patient remained hemodynamically stable throughout the  post bypass period.  A test dose of protamine was administered and was well tolerated.  The atrial and aortic cannulae were removed.  There was some bleeding from the aortic cannulation site, which required an additional pledgeted suture.  The remainder of the protamine was administered without incident.  The chest was copiously irrigated with warm saline.  Hemostasis was achieved.  The pericardium was reapproximated over the ascending aorta with interrupted 3-0 silk sutures.  It came together easily without tension.  The left pleural and mediastinal chest tubes were placed via separate subcostal incisions and secured with #1 silk sutures.  The sternum was closed with a combination of single and double-heavy gauge stainless steel wires.  Pectoralis fascia and subcutaneous tissue and skin were closed in a standard fashion.  All sponge, needle, and instrument counts were corrected at the end of the procedure.  The patient was taken from the operating room to the surgical intensive care unit, intubated and in good condition.   PUS D: 05/28/2023 5:40:55 pm T: 05/29/2023 1:36:00 am  JOB: [id number]/ [id number]

## 2023-05-29 NOTE — Progress Notes (Signed)
 RT x 2 attempted to place an a-line in the left and right radial artery without success.

## 2023-05-29 NOTE — Plan of Care (Signed)
  Problem: Education: Goal: Will demonstrate proper wound care and an understanding of methods to prevent future damage Outcome: Progressing Goal: Knowledge of disease or condition will improve Outcome: Progressing Goal: Knowledge of the prescribed therapeutic regimen will improve Outcome: Progressing   Problem: Cardiac: Goal: Will achieve and/or maintain hemodynamic stability Outcome: Progressing   Problem: Clinical Measurements: Goal: Postoperative complications will be avoided or minimized Outcome: Progressing

## 2023-05-29 NOTE — Progress Notes (Signed)
 EVENING ROUNDS NOTE :     [address]       [place] [address]             [phone]                 1 Day Post-Op Procedure(s) (LRB): CORONARY ARTERY BYPASS GRAFTING TIMES FOUR USING LEFT INTERNAL MAMMARY ARTERY AND ENDOSCOPICALLY HARVESTED RIGHT GREAT SAPHENOUS VEIN; RIGHT CORONARY ENDARTERECTOMY (N/A) ECHOCARDIOGRAM, TRANSESOPHAGEAL (N/A)   Total Length of Stay:  LOS: 1 day  Events:   No events Stable day    BP 112/68   Pulse 85   Temp 98.9 F (37.2 C) (Oral)   Resp 20   Ht 5\' 4"  (1.626 m)   Wt 92.5 kg   SpO2 94%   BMI 35.00 kg/m   PAP: (21-49)/(4-31) 35/18 CO:  [5.4 L/min-8.3 L/min] 5.4 L/min CI:  [2.8 L/min/m2-4.27 L/min/m2] 2.8 L/min/m2      sodium chloride     albumin human      ceFAZolin (ANCEF) IV Stopped (05/29/23 1420)   dexmedetomidine (PRECEDEX) IV infusion Stopped (05/28/23 1620)   nitroGLYCERIN     phenylephrine (NEO-SYNEPHRINE) Adult infusion Stopped (05/29/23 1537)    I/O last 3 completed shifts: In: 7971.6 [P.O.:150; I.V.:5283.1; IV Piggyback:2538.5] Out: 4635 [Urine:3455; Blood:690; Chest Tube:490]      Latest Ref Rng & Units 05/29/2023    3:37 PM 05/29/2023    5:09 AM 05/28/2023    8:30 PM  CBC  WBC 4.0 - 10.5 K/uL 10.5  12.5  12.8   Hemoglobin 13.0 - 17.0 g/dL 7.1  7.8  8.6   Hematocrit 39.0 - 52.0 % 21.8  23.6  25.7   Platelets 150 - 400 K/uL 115  149  143        Latest Ref Rng & Units 05/29/2023    3:37 PM 05/29/2023    5:09 AM 05/28/2023    8:30 PM  BMP  Glucose 70 - 99 mg/dL 528  413  244   BUN 8 - 23 mg/dL 17  14  15    Creatinine 0.61 - 1.24 mg/dL 0.10  2.72  5.36   Sodium 135 - 145 mmol/L 135  135  139   Potassium 3.5 - 5.1 mmol/L 4.1  3.7  4.3   Chloride 98 - 111 mmol/L 110  111  108   CO2 22 - 32 mmol/L 21  19  19    Calcium 8.9 - 10.3 mg/dL 8.1  6.9  7.4     ABG    Component Value Date/Time   PHART 7.333 (L) 05/28/2023 1658   PCO2ART 42.8 05/28/2023 1658   PO2ART 96 05/28/2023 1658   HCO3 22.9  05/28/2023 1658   TCO2 24 05/28/2023 1658   ACIDBASEDEF 3.0 (H) 05/28/2023 1658   O2SAT 97 05/28/2023 1658       Brynda Greathouse, MD 05/29/2023 6:47 PM

## 2023-05-29 NOTE — Plan of Care (Signed)

## 2023-05-30 ENCOUNTER — Inpatient Hospital Stay (HOSPITAL_COMMUNITY)

## 2023-05-30 LAB — CBC
HCT: 21.3 % — ABNORMAL LOW (ref 39.0–52.0)
Hemoglobin: 7 g/dL — ABNORMAL LOW (ref 13.0–17.0)
MCH: 30.8 pg (ref 26.0–34.0)
MCHC: 32.9 g/dL (ref 30.0–36.0)
MCV: 93.8 fL (ref 80.0–100.0)
Platelets: 102 10*3/uL — ABNORMAL LOW (ref 150–400)
RBC: 2.27 MIL/uL — ABNORMAL LOW (ref 4.22–5.81)
RDW: 13.9 % (ref 11.5–15.5)
WBC: 10.3 10*3/uL (ref 4.0–10.5)
nRBC: 0 % (ref 0.0–0.2)

## 2023-05-30 LAB — GLUCOSE, CAPILLARY
Glucose-Capillary: 112 mg/dL — ABNORMAL HIGH (ref 70–99)
Glucose-Capillary: 120 mg/dL — ABNORMAL HIGH (ref 70–99)
Glucose-Capillary: 123 mg/dL — ABNORMAL HIGH (ref 70–99)
Glucose-Capillary: 125 mg/dL — ABNORMAL HIGH (ref 70–99)
Glucose-Capillary: 132 mg/dL — ABNORMAL HIGH (ref 70–99)
Glucose-Capillary: 159 mg/dL — ABNORMAL HIGH (ref 70–99)

## 2023-05-30 LAB — BASIC METABOLIC PANEL
Anion gap: 7 (ref 5–15)
BUN: 18 mg/dL (ref 8–23)
CO2: 21 mmol/L — ABNORMAL LOW (ref 22–32)
Calcium: 7.8 mg/dL — ABNORMAL LOW (ref 8.9–10.3)
Chloride: 108 mmol/L (ref 98–111)
Creatinine, Ser: 1.06 mg/dL (ref 0.61–1.24)
GFR, Estimated: >60 mL/min (ref >60)
Glucose, Bld: 109 mg/dL — ABNORMAL HIGH (ref 70–99)
Potassium: 3.9 mmol/L (ref 3.5–5.1)
Sodium: 136 mmol/L (ref 135–145)

## 2023-05-30 MED ORDER — FUROSEMIDE 40 MG PO TABS
40.0000 mg | ORAL_TABLET | Freq: Every day | ORAL | Status: DC
  Administered 2023-05-30 – 2023-06-02 (×4): 40 mg via ORAL
  Filled 2023-05-30 (×4): qty 1

## 2023-05-30 MED ORDER — SODIUM CHLORIDE 0.9% FLUSH: 3.0000 mL | INTRAVENOUS | Status: DC | PRN

## 2023-05-30 MED ORDER — MAGNESIUM HYDROXIDE 400 MG/5ML PO SUSP: 30.0000 mL | Freq: Every day | ORAL | Status: DC | PRN

## 2023-05-30 MED ORDER — CLOPIDOGREL BISULFATE 75 MG PO TABS
75.0000 mg | ORAL_TABLET | Freq: Every day | ORAL | Status: DC
  Administered 2023-05-30 – 2023-06-02 (×4): 75 mg via ORAL
  Filled 2023-05-30 (×4): qty 1

## 2023-05-30 MED ORDER — SODIUM CHLORIDE 0.9 % IV SOLN: 250.0000 mL | INTRAVENOUS | Status: AC | PRN

## 2023-05-30 MED ORDER — ~~LOC~~ CARDIAC SURGERY, PATIENT & FAMILY EDUCATION: Freq: Once | Status: AC

## 2023-05-30 MED ORDER — INSULIN ASPART 100 UNIT/ML IJ SOLN
0.0000 [IU] | Freq: Three times a day (TID) | INTRAMUSCULAR | Status: DC
  Administered 2023-05-30: 2 [IU] via SUBCUTANEOUS
  Administered 2023-05-30: 3 [IU] via SUBCUTANEOUS
  Administered 2023-05-30: 2 [IU] via SUBCUTANEOUS

## 2023-05-30 MED ORDER — SODIUM CHLORIDE 0.9% FLUSH
3.0000 mL | Freq: Two times a day (BID) | INTRAVENOUS | Status: DC
  Administered 2023-05-30 – 2023-06-02 (×7): 3 mL via INTRAVENOUS

## 2023-05-30 MED ORDER — ALUM & MAG HYDROXIDE-SIMETH 200-200-20 MG/5ML PO SUSP: 15.0000 mL | Freq: Four times a day (QID) | ORAL | Status: DC | PRN

## 2023-05-30 MED ORDER — METOPROLOL SUCCINATE ER 25 MG PO TB24
25.0000 mg | ORAL_TABLET | Freq: Every day | ORAL | Status: DC
  Administered 2023-05-30: 25 mg via ORAL
  Filled 2023-05-30: qty 1

## 2023-05-30 MED ORDER — ASPIRIN 81 MG PO TBEC
81.0000 mg | DELAYED_RELEASE_TABLET | Freq: Every day | ORAL | Status: DC
  Administered 2023-05-30 – 2023-06-02 (×4): 81 mg via ORAL
  Filled 2023-05-30 (×4): qty 1

## 2023-05-30 MED ORDER — POTASSIUM CHLORIDE CRYS ER 10 MEQ PO TBCR
20.0000 meq | EXTENDED_RELEASE_TABLET | Freq: Every day | ORAL | Status: DC
  Administered 2023-05-30: 20 meq via ORAL
  Filled 2023-05-30 (×3): qty 2

## 2023-05-30 MED ORDER — INSULIN ASPART 100 UNIT/ML IJ SOLN: 0.0000 [IU] | Freq: Every day | INTRAMUSCULAR | Status: DC

## 2023-05-30 MED FILL — Heparin Sodium (Porcine) Inj 1000 Unit/ML: Qty: 1000 | Status: AC

## 2023-05-30 MED FILL — Potassium Chloride Inj 2 mEq/ML: INTRAVENOUS | Qty: 40 | Status: AC

## 2023-05-30 MED FILL — Magnesium Sulfate Inj 50%: INTRAMUSCULAR | Qty: 10 | Status: AC

## 2023-05-30 NOTE — Progress Notes (Signed)
 2 Days Post-Op Procedure(s) (LRB): CORONARY ARTERY BYPASS GRAFTING TIMES FOUR USING LEFT INTERNAL MAMMARY ARTERY AND ENDOSCOPICALLY HARVESTED RIGHT GREAT SAPHENOUS VEIN; RIGHT CORONARY ENDARTERECTOMY (N/A) ECHOCARDIOGRAM, TRANSESOPHAGEAL (N/A) Subjective: No complaints this AM  Objective: Vital signs in last 24 hours: Temp:  [98.4 F (36.9 C)-100.6 F (38.1 C)] 98.4 F (36.9 C) (03/07 0400) Pulse Rate:  [52-85] 85 (03/06 1110) Cardiac Rhythm: Normal sinus rhythm;Bundle branch block (03/06 2100) Resp:  [14-25] 16 (03/07 0600) BP: (83-125)/(47-77) 109/64 (03/07 0600) SpO2:  [91 %-98 %] 96 % (03/07 0600) Weight:  [93 kg] 93 kg (03/07 0600)  Hemodynamic parameters for last 24 hours: PAP: (30-49)/(13-31) 35/18  Intake/Output from previous day: 03/06 0701 - 03/07 0700 In: 1752.4 [P.O.:520; I.V.:604.9; IV Piggyback:487.5] Out: 880 [Urine:880] Intake/Output this shift: No intake/output data recorded.  General appearance: alert, cooperative, and no distress Neurologic: intact Heart: regular rate and rhythm Lungs: diminished breath sounds bibasilar Abdomen: normal findings: soft, non-tender Extremities: edema ++  Lab Results: Recent Labs    05/29/23 1537 05/30/23 0455  WBC 10.5 10.3  HGB 7.1* 7.0*  HCT 21.8* 21.3*  PLT 115* 102*   BMET:  Recent Labs    05/29/23 1537 05/30/23 0455  NA 135 136  K 4.1 3.9  CL 110 108  CO2 21* 21*  GLUCOSE 134* 109*  BUN 17 18  CREATININE 1.21 1.06  CALCIUM 8.1* 7.8*    PT/INR:  Recent Labs    05/28/23 1443  LABPROT 19.3*  INR 1.6*   ABG    Component Value Date/Time   PHART 7.333 (L) 05/28/2023 1658   HCO3 22.9 05/28/2023 1658   TCO2 24 05/28/2023 1658   ACIDBASEDEF 3.0 (H) 05/28/2023 1658   O2SAT 97 05/28/2023 1658   CBG (last 3)  Recent Labs    05/29/23 2006 05/30/23 0013 05/30/23 0402  GLUCAP 108* 112* 120*    Assessment/Plan: S/P Procedure(s) (LRB): CORONARY ARTERY BYPASS GRAFTING TIMES FOUR USING LEFT  INTERNAL MAMMARY ARTERY AND ENDOSCOPICALLY HARVESTED RIGHT GREAT SAPHENOUS VEIN; RIGHT CORONARY ENDARTERECTOMY (N/A) ECHOCARDIOGRAM, TRANSESOPHAGEAL (N/A) POD # 2 Looks good NEURO- intact CV - in Sr, BP improved  ASA, Plavix  No Statin due to allergy, on Repatha  Beta blocker  Dc pacing wires  Hold Cozaar until BP higher RESP- continue IS for atelectasis, wean O2 RENAL- creatinine normal, lytes ok  Diurese, supplement K ENDO- CBG mildly elevated  Change CBG, SSI to AC and HS GI- heart healthy diet Anemia- Hgb stable at 7- monitor Thrombocytopenia- PLT stable at 100k SCD + enoxaparin Cardiac rehab Transfer to 4E  LOS: 2 days    Loreli Slot 05/30/2023

## 2023-05-30 NOTE — Progress Notes (Signed)
 Pt admitted to rm 1 from [facility]. Initiated tele. Oriented pt to the unit. VSS. Call bell within reach.   Lawson Radar, RN

## 2023-05-30 NOTE — Plan of Care (Signed)
  Problem: Education: Goal: Knowledge of General Education information will improve Description: Including pain rating scale, medication(s)/side effects and non-pharmacologic comfort measures Outcome: Progressing   Problem: Clinical Measurements: Goal: Ability to maintain clinical measurements within normal limits will improve Outcome: Progressing Goal: Will remain free from infection Outcome: Progressing Goal: Diagnostic test results will improve Outcome: Progressing Goal: Respiratory complications will improve Outcome: Progressing   Problem: Activity: Goal: Risk for activity intolerance will decrease Outcome: Progressing   Problem: Nutrition: Goal: Adequate nutrition will be maintained Outcome: Progressing

## 2023-05-30 NOTE — Discharge Summary (Signed)
 Physician Discharge Summary  Patient ID: Frederick Pace. MRN: [id number] DOB/AGE: [birth year] 73 y.o.  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 Exertional angina Hypertension Dyslipidemia Statin intolerance Mild aortic insufficiency History of right bundle branch block Solitary kidney Renal artery stenosis with renovascular hypertension Cervical disc disease Arthritis S/P CABG x 4 Expected acute blood loss anemia Thrombocytopenia   Discharged Condition: stable  HPI: This is a 73 year old man with a history of hypertension, hyperlipidemia, mild aortic insufficiency, left main and three-vessel coronary disease, right bundle branch block, exertional angina, single kidney, renal artery stenosis, renovascular hypertension, cervical disc disease, and arthritis.  He was admitted to the hospital in November.  He had a CT for coronary calcium scoring which showed a score of 1066 (86 percentile).  He was having intermittent chest tightness which radiated to his throat and shortness of breath with exertion.  He was not having any rest symptoms.  He underwent cardiac catheterization which showed an 80 to 90% distal left main stenosis with chronic total occlusion of his RCA.  He was advised to undergo coronary artery bypass grafting.  However, he decided to go home without having the procedure.  We have called him several times and he finally agreed to come in and further discuss surgery.   He continues to have some shortness of breath and chest tightness with exertion.  Relieved with rest.  Has not used nitroglycerin.  He complains  of some swelling and pain in his hands.  He has also been having some back pain.  Patient has left main and three-vessel coronary disease-with stable exertional angina.  Coronary bypass graft is indicated for survival benefit and relief of symptoms. Dr. Dorris Fetch discussed possible complications, benefits, and risks with the patient. He agreed to proceed with surgery. Pre operative carotid duplex US showed no significant internal carotid artery stenosis bilaterally.  Hospital Course: Patient underwent a CABG x 4 utilizing LIMA to LAD, SVG to distal LAD, SVG to RCA and SVG to OM as well as RCA endarterectomy and endoscopic right greater saphenous vein harvest. He was transported from the OR to 2H ICU in stable condition. He was extubated by 5 PM on the day of surgery.  Vital signs, hemodynamics, and respiratory status are remained stable.  Mild hypotension related to vasodilation was managed with IV Neo-Synephrine early postoperatively.  This was weaned off on the first postoperative day.  The monitoring lines and chest tubes were removed on postop day 1.  He was mobilized routinely.  By the second postoperative day, blood pressure was stable off of the Neo-Synephrine.  He remained in a stable sinus rhythm so the pacer wires were removed on postop day 2.  He had expected acute blood loss anemia with a hemoglobin at 7 g.  This was well-tolerated and was monitored.  Daily subcutaneous enoxaparin was added for DVT prophylaxis.  He was ready for transfer to 4E [facility] on the second postop day.  He was weaned form supplemental oxygen and regained independence with mobility without any difficulty. He was ready for discharge to home in stable condition on post-op day 5.   Consults: None  Significant Diagnostic Studies:   CLINICAL DATA:  Status post coronary artery bypass graft.  EXAM: PORTABLE CHEST 1 VIEW   COMPARISON:  May 29, 2023.   FINDINGS: Stable cardiomegaly. Swan-Ganz catheter has  been removed. Right lung is clear. Mild left pleural effusion is noted with associated atelectasis. Bony thorax is unremarkable. No pneumothorax is noted status post left-sided chest tube removal.   IMPRESSION: Mild left pleural effusion with associated left basilar atelectasis.     Electronically Signed   By: Lupita Raider M.D.   On: 05/30/2023 09:58  Treatments:  Operative Report    DATE OF PROCEDURE: 05/28/2023   PREOPERATIVE DIAGNOSIS:  Left main and three-vessel coronary artery disease.   POSTOPERATIVE DIAGNOSIS:  Left main and three-vessel coronary artery disease.   PROCEDURE:  Median sternotomy, extracorporeal circulation, coronary artery bypass grafting x4 (left internal mammary artery to LAD, saphenous vein graft to distal LAD, saphenous vein graft to obtuse marginal, saphenous vein graft to distal right coronary  artery with endarterectomy).  Endoscopic vein harvest, right leg.   SURGEON:  Salvatore Decent. Dorris Fetch, MD   ASSISTANT:  Aloha Gell, PA    FINDINGS:  Transesophageal echocardiography showed preserved left ventricular systolic function, mild aortic insufficiency, no significant mitral insufficiency.  Post bypass study unchanged.  LAD diffusely diseased.  Right coronary branches not  graftable.  Right coronary heavily diseased, endarterectomy performed with good tapering plaque both proximally and distally.  OM intramyocardial, saphenous vein and mammary artery good quality.  Discharge Exam: Blood pressure (!) 141/90, pulse 99, temperature 98.2 F (36.8 C), temperature source Oral, resp. rate 20, height 5\' 4"  (1.626 m), weight 86.6 kg, SpO2 95%. General appearance: alert, cooperative, and no distress Neurologic: intact Heart: RRR, SR with few PVC's Lungs: normal work of breathing on RA, stable sats Abdomen: soft, no tenderness Extremities: mild LE edema, has TED hose in place Wound: The sternotomy incision and EVH incision are intact and dry.    Disposition Discharge to home in stable condition  Discharge Instructions     Amb Referral to Cardiac Rehabilitation   Complete by: As directed    Diagnosis: CABG   CABG X ___: 4   After initial evaluation and assessments completed: Virtual Based Care may be provided alone or in conjunction with Phase 2 Cardiac Rehab based on patient barriers.: Yes   Intensive Cardiac Rehabilitation (ICR) [facility] location only OR Traditional Cardiac Rehabilitation (TCR) *If criteria for ICR are not met will enroll in TCR [facility] only): Yes      Allergies as of 06/02/2023       Reactions   Crestor [rosuvastatin] Rash   Diffuse pruritic rash    Statins Rash        Medication List     STOP taking these medications    amLODipine 10 MG tablet Commonly known as: NORVASC   multivitamin with minerals tablet   nitroGLYCERIN 0.4 MG SL tablet Commonly known as: NITROSTAT       TAKE these medications    aspirin EC 81 MG tablet Take 1 tablet (81 mg total) by mouth daily. Swallow whole.   clopidogrel 75 MG tablet Commonly known as: PLAVIX Take 1 tablet (75 mg total) by mouth daily.   furosemide 40 MG tablet Commonly known as: LASIX Take 1 tablet (40 mg total) by mouth daily.   guaiFENesin 600 MG 12 hr tablet Commonly known as: MUCINEX Take 1 tablet (600 mg total) by mouth 2 (two) times daily as needed.   losartan 25 MG tablet Commonly known as: Cozaar Take 1 tablet (25 mg total) by mouth  daily. What changed:  medication strength how much to take   metoprolol succinate 100 MG 24 hr tablet Commonly known as: TOPROL-XL Take 1 tablet (100 mg total) by mouth daily. Take with or immediately following a meal. What changed:  medication strength how much to take additional instructions   potassium chloride SA 20 MEQ tablet Commonly known as: KLOR-CON M Take 1 tablet (20 mEq total) by mouth daily.   Repatha SureClick 140 MG/ML Soaj Generic drug: Evolocumab Inject 140 mg into the skin  every 14 (fourteen) days.   Trigels-F Forte Caps capsule Generic drug: Fe Fum-Vit C-Vit B12-FA Take 1 capsule by mouth daily after breakfast.       ASK your doctor about these medications    traMADol 50 MG tablet Commonly known as: ULTRAM Take 1 tablet (50 mg total) by mouth every 6 (six) hours as needed for up to 7 days for moderate pain (pain score 4-6) or severe pain (pain score 7-10). Ask about: Should I take this medication?        Follow-up Information     [facility] [place]. Go on 06/23/2023.   Specialty: Cardiology Why: Your appointment with Cadence Fransico Michael, PA-C is at 8:45am Contact information: [address] [place] [address] [phone]        [facility]. Go on 06/24/2023.   Specialty: Cardiothoracic Surgery Why: Your appointment is at 1:00pm.  Please obtain a chest x-ray 1 hour before the appointment at [facility] located at [address] [facility]. Contact information: [address] [place], [address] [place] [address] [phone]                The patient has been discharged on:   1.Beta Blocker:  Yes [ x  ]                              No   [   ]                              If No, reason:  2.Ace Inhibitor/ARB: Yes [ x  ]                                     No  [    ]                                     If No, reason:  3.Statin:   Yes [   ]                  No  [ x  ]                  If No, reason: Statin allergy  4.Marlowe Kays:  Yes  [ x  ]                  No   [   ]                  If No, reason:  5. ACS on Admission?   P2Y12 Inhibitor:  Yes  [ x  ]  No  [  ]    Signed: Leary Roca, PA-C 06/16/2023, 3:20 PM

## 2023-05-31 ENCOUNTER — Inpatient Hospital Stay (HOSPITAL_COMMUNITY)

## 2023-05-31 LAB — GLUCOSE, CAPILLARY
Glucose-Capillary: 105 mg/dL — ABNORMAL HIGH (ref 70–99)
Glucose-Capillary: 151 mg/dL — ABNORMAL HIGH (ref 70–99)

## 2023-05-31 LAB — CBC
HCT: 21.6 % — ABNORMAL LOW (ref 39.0–52.0)
Hemoglobin: 7.2 g/dL — ABNORMAL LOW (ref 13.0–17.0)
MCH: 30.4 pg (ref 26.0–34.0)
MCHC: 33.3 g/dL (ref 30.0–36.0)
MCV: 91.1 fL (ref 80.0–100.0)
Platelets: 126 10*3/uL — ABNORMAL LOW (ref 150–400)
RBC: 2.37 MIL/uL — ABNORMAL LOW (ref 4.22–5.81)
RDW: 13.8 % (ref 11.5–15.5)
WBC: 10.8 10*3/uL — ABNORMAL HIGH (ref 4.0–10.5)
nRBC: 0 % (ref 0.0–0.2)

## 2023-05-31 LAB — BASIC METABOLIC PANEL
Anion gap: 9 (ref 5–15)
BUN: 17 mg/dL (ref 8–23)
CO2: 24 mmol/L (ref 22–32)
Calcium: 8 mg/dL — ABNORMAL LOW (ref 8.9–10.3)
Chloride: 103 mmol/L (ref 98–111)
Creatinine, Ser: 0.98 mg/dL (ref 0.61–1.24)
GFR, Estimated: >60 mL/min (ref >60)
Glucose, Bld: 115 mg/dL — ABNORMAL HIGH (ref 70–99)
Potassium: 3.5 mmol/L (ref 3.5–5.1)
Sodium: 136 mmol/L (ref 135–145)

## 2023-05-31 MED ORDER — METOLAZONE 5 MG PO TABS
2.5000 mg | ORAL_TABLET | Freq: Once | ORAL | Status: AC
  Administered 2023-05-31: 2.5 mg via ORAL
  Filled 2023-05-31: qty 1

## 2023-05-31 MED ORDER — METOPROLOL SUCCINATE ER 50 MG PO TB24
50.0000 mg | ORAL_TABLET | Freq: Every day | ORAL | Status: DC
  Administered 2023-05-31 – 2023-06-01 (×2): 50 mg via ORAL
  Filled 2023-05-31 (×2): qty 1

## 2023-05-31 MED ORDER — FE FUM-VIT C-VIT B12-FA 460-60-0.01-1 MG PO CAPS
1.0000 | ORAL_CAPSULE | Freq: Every day | ORAL | Status: DC
  Administered 2023-05-31 – 2023-06-02 (×3): 1 via ORAL
  Filled 2023-05-31 (×3): qty 1

## 2023-05-31 MED ORDER — FUROSEMIDE 40 MG PO TABS
40.0000 mg | ORAL_TABLET | Freq: Once | ORAL | Status: AC
  Administered 2023-05-31: 40 mg via ORAL
  Filled 2023-05-31: qty 1

## 2023-05-31 MED ORDER — GUAIFENESIN ER 600 MG PO TB12
600.0000 mg | ORAL_TABLET | Freq: Two times a day (BID) | ORAL | Status: DC | PRN
Start: 2023-05-31 — End: 2023-06-01

## 2023-05-31 MED ORDER — POTASSIUM CHLORIDE CRYS ER 20 MEQ PO TBCR
20.0000 meq | EXTENDED_RELEASE_TABLET | Freq: Two times a day (BID) | ORAL | Status: DC
  Administered 2023-05-31: 20 meq via ORAL
  Filled 2023-05-31: qty 1

## 2023-05-31 MED ORDER — POTASSIUM CHLORIDE CRYS ER 20 MEQ PO TBCR
40.0000 meq | EXTENDED_RELEASE_TABLET | Freq: Two times a day (BID) | ORAL | Status: DC
  Administered 2023-05-31 – 2023-06-02 (×4): 40 meq via ORAL
  Filled 2023-05-31 (×4): qty 2

## 2023-05-31 NOTE — Progress Notes (Addendum)
 [address]       [place] [address]             [phone]      3 Days Post-Op Procedure(s) (LRB): CORONARY ARTERY BYPASS GRAFTING TIMES FOUR USING LEFT INTERNAL MAMMARY ARTERY AND ENDOSCOPICALLY HARVESTED RIGHT GREAT SAPHENOUS VEIN; RIGHT CORONARY ENDARTERECTOMY (N/A) ECHOCARDIOGRAM, TRANSESOPHAGEAL (N/A) Subjective: The patient states he "feels like shit this morning" but when asked what was wrong he did not have any specific complaints. He states he has no pain but he does have some shortness of breath with exertion and he is curious as to why he is still very "puffy."  Objective: Vital signs in last 24 hours: Temp:  [97.8 F (36.6 C)-99.3 F (37.4 C)] 98 F (36.7 C) (03/08 0435) Pulse Rate:  [92-106] 96 (03/08 0435) Cardiac Rhythm: Sinus tachycardia;Normal sinus rhythm;Bundle branch block (03/07 1920) Resp:  [19-23] 20 (03/08 0435) BP: (108-164)/(59-95) 164/95 (03/08 0435) SpO2:  [90 %-95 %] 93 % (03/08 0435) Weight:  [91.1 kg] 91.1 kg (03/08 0435)  Hemodynamic parameters for last 24 hours:    Intake/Output from previous day: 03/07 0701 - 03/08 0700 In: 120 [P.O.:120] Out: 1350 [Urine:1350] Intake/Output this shift: No intake/output data recorded.  General appearance: alert, cooperative, and no distress Neurologic: intact Heart: regular rate and rhythm-sinus tachycardia, PVCs. No murmur Lungs: slightly diminished left basilar breath sounds Abdomen: soft, non-tender; bowel sounds normal; no masses,  no organomegaly Extremities: edema 2+ bilateral lower extremities and bilateral upper extremities Wound: Clean and dry without sign of infection  Lab Results: Recent Labs    05/30/23 0455 05/31/23 0259  WBC 10.3 10.8*  HGB 7.0* 7.2*  HCT 21.3* 21.6*  PLT 102* 126*   BMET:  Recent Labs    05/30/23 0455 05/31/23 0259  NA 136 136  K 3.9 3.5  CL 108 103  CO2 21* 24  GLUCOSE 109* 115*  BUN 18 17  CREATININE 1.06 0.98  CALCIUM 7.8*  8.0*    PT/INR:  Recent Labs    05/28/23 1443  LABPROT 19.3*  INR 1.6*   ABG    Component Value Date/Time   PHART 7.333 (L) 05/28/2023 1658   HCO3 22.9 05/28/2023 1658   TCO2 24 05/28/2023 1658   ACIDBASEDEF 3.0 (H) 05/28/2023 1658   O2SAT 97 05/28/2023 1658   CBG (last 3)  Recent Labs    05/30/23 1606 05/30/23 2100 05/31/23 0609  GLUCAP 132* 123* 105*    Assessment/Plan: S/P Procedure(s) (LRB): CORONARY ARTERY BYPASS GRAFTING TIMES FOUR USING LEFT INTERNAL MAMMARY ARTERY AND ENDOSCOPICALLY HARVESTED RIGHT GREAT SAPHENOUS VEIN; RIGHT CORONARY ENDARTERECTOMY (N/A) ECHOCARDIOGRAM, TRANSESOPHAGEAL (N/A)  Neuro: Pain controlled  CV: On ASA, Plavix. Statin allergy, will restart Repatha at discharge. Labile BP, SP 95-164, mostly controlled in the 110s and 120s. NSR-ST, with PVCs and some ventricular trigeminy. HR 90s-106. On Toprol XL 25mg  daily, will titrate to 50mg  daily.   Pulm: Saturating well on 1L [place]. CXR yesterday with a small left pleural effusion and left basilar atelectasis. Patient with some phlegm that he has not been able to cough up, will add mucinex prn to help with expectoration. Encourage IS and ambulation. Wean oxygen as able for O2>90%.   GI: No nausea/vomiting. Passing flatus, -BM. Lack of appetite. Continue bowel regimen.   Endo: No hx of DM. CBGs 132/123/105 on SSI PRN. Will d/c SSI and CBGs.   Renal: Cr 0.98, stable. UO 1350cc/24hrs. +7lbs from preop weight, -5lbs from  yesterday. Continue PO Lasix 40mg  daily. Will add TED hose. K 3.5, supplement.   ID: Likely reactive leukocytosis, WBC 10.8. Tmax 99.3. Overall stable. Will clinically monitor.   Expected postop ABLA: H/H 7.2/21.6, stable. Close to transfusion threshold, will continue to clinically monitor. Reactive thrombocytopenia improving, plt 126,000 this AM.   DVT Prophylaxis: Lovenox  Dispo: Working on diuresis, continue to monitor heart rate and rhythm. Dispo planning.    LOS: 3 days     Jenny Reichmann, PA-C 05/31/2023   Chart reviewed, patient examined, agree with above.  Wt is still 10 lbs over preop and he has moderate peripheral edema. Left pleural effusion and LLL atelectasis on CXR. Continue diuresis , IS, ambulation. Repeat CXR Monday to followup on this. Otherwise he is doing well.

## 2023-05-31 NOTE — Evaluation (Signed)
 Occupational Therapy Evaluation Patient Details Name: Goldman Birchall. MRN: [id number] DOB: 11/14/1950 Today's Date: 05/31/2023   History of Present Illness   73 y.o. male presents to [facility] 05/28/23 for CABG x4. ICU stay from 3/5-3/7. PMHx: HTN, hyperlipidemia, mild aortic insufficiency, left main and three-vessel coronary disease, right bundle branch block, exertional angina, single kidney, renal artery stenosis, renovascular hypertension, cervical disc disease, and arthritis     Clinical Impressions Pt presented in chair and agreeable to session. Pt was able to report his precautions but when completion of tasks would forget to follow when completing tasks occasionally. Pt reports they are going to aunt's house which has a walk in shower but will have a tub at their home. Pt was unsure if they have access to a shower seat and will ask family if they have one. He was also educated about baths and infection as at baseline reported he normally sits into the tub. He then was continued to be educated about AE for LE dressing and peri care as her reported concerns about hygiene post BM. Spoke to pt about OP cardiac rehab as they want to go back to work and reported they have to complete lifting light objects.      If plan is discharge home, recommend the following:   Assistance with cooking/housework;Help with stairs or ramp for entrance     Functional Status Assessment   Patient has had a recent decline in their functional status and demonstrates the ability to make significant improvements in function in a reasonable and predictable amount of time.     Equipment Recommendations   Other (comment) (Pt reported they might have access to a shower seat but needs to check)     Recommendations for Other Services         Precautions/Restrictions   Precautions Precautions: Fall;Sternal Precaution Booklet Issued: No (book in room) Recall of Precautions/Restrictions:  Impaired Precaution/Restrictions Comments: Pt can repeat what not to complete but will foregt when completing activities Restrictions Weight Bearing Restrictions Per Provider Order: Yes RUE Weight Bearing Per Provider Order: Non weight bearing LUE Weight Bearing Per Provider Order: Non weight bearing Other Position/Activity Restrictions: Sternal prec     Mobility Bed Mobility               General bed mobility comments: pt presented in chair    Transfers Overall transfer level: Modified independent Equipment used: None               General transfer comment: pt just cues for hand placement for sternal precautions      Balance Overall balance assessment: Mild deficits observed, not formally tested                                         ADL either performed or assessed with clinical judgement   ADL Overall ADL's : Needs assistance/impaired Eating/Feeding: Independent;Sitting   Grooming: Wash/dry face;Wash/dry hands;Set up;Sitting   Upper Body Bathing: Supervision/ safety;Sitting   Lower Body Bathing: Minimal assistance;Sit to/from stand;Sitting/lateral leans   Upper Body Dressing : Supervision/safety;Sitting   Lower Body Dressing: Minimal assistance;Sit to/from stand;Sitting/lateral leans   Toilet Transfer: Supervision/safety;Cueing for sequencing;Cueing for safety   Toileting- Clothing Manipulation and Hygiene: Minimal assistance;Moderate assistance;Sit to/from stand   Tub/ Shower Transfer: Walk-in shower;Contact guard assist;Cueing for safety;Cueing for sequencing   Functional mobility during ADLs: Contact guard assist  Vision Baseline Vision/History: 1 Wears glasses Ability to See in Adequate Light: 0 Adequate Patient Visual Report: No change from baseline Vision Assessment?: No apparent visual deficits     Perception Perception: Within Functional Limits       Praxis Praxis: WFL       Pertinent Vitals/Pain Pain  Assessment Pain Assessment: Faces Faces Pain Scale: Hurts a little bit Facial Expression: Relaxed, neutral Body Movements: Absence of movements Muscle Tension: Relaxed Compliance with ventilator (intubated pts.): N/A Vocalization (extubated pts.): N/A CPOT Total: 0 Pain Descriptors / Indicators: Sore Pain Intervention(s): Limited activity within patient's tolerance, Monitored during session     Extremity/Trunk Assessment Upper Extremity Assessment Upper Extremity Assessment: Overall WFL for tasks assessed (limited due to sternal precautions)   Lower Extremity Assessment Lower Extremity Assessment: Defer to PT evaluation   Cervical / Trunk Assessment Cervical / Trunk Assessment: Normal   Communication Communication Communication: Impaired Factors Affecting Communication: Hearing impaired   Cognition Arousal: Alert Behavior During Therapy: WFL for tasks assessed/performed Cognition: No apparent impairments                               Following commands: Intact       Cueing  General Comments   Cueing Techniques: Verbal cues      Exercises     Shoulder Instructions      Home Living Family/patient expects to be discharged to:: Private residence Living Arrangements: Other relatives;Other (Comment) Available Help at Discharge: Family;Available 24 hours/day Type of Home: House Home Access: Stairs to enter [employer] of Steps: 3 Entrance Stairs-Rails: Left Home Layout: Two level;Full bath on main level;Able to live on main level with bedroom/bathroom     Bathroom Shower/Tub: Producer, television/film/video: Standard     Home Equipment: Agricultural consultant (2 wheels)   Additional Comments: info for aunt's house      Prior Functioning/Environment Prior Level of Function : Independent/Modified Independent             Mobility Comments: Ind with no AD ADLs Comments: Ind    OT Problem List: Decreased strength;Decreased activity  tolerance;Impaired balance (sitting and/or standing);Decreased safety awareness;Pain   OT Treatment/Interventions: Self-care/ADL training;DME and/or AE instruction;Therapeutic activities;Patient/family education;Balance training      OT Goals(Current goals can be found in the care plan section)   Acute Rehab OT Goals Patient Stated Goal: to get better OT Goal Formulation: With patient Time For Goal Achievement: 06/14/23 Potential to Achieve Goals: Good   OT Frequency:  Min 1X/week    Co-evaluation              AM-PAC OT "6 Clicks" Daily Activity     Outcome Measure Help from another person eating meals?: None Help from another person taking care of personal grooming?: A Little Help from another person toileting, which includes using toliet, bedpan, or urinal?: A Lot Help from another person bathing (including washing, rinsing, drying)?: A Little Help from another person to put on and taking off regular upper body clothing?: None Help from another person to put on and taking off regular lower body clothing?: A Little 6 Click Score: 19   End of Session Equipment Utilized During Treatment: Gait belt Nurse Communication: Mobility status  Activity Tolerance: Patient tolerated treatment well Patient left: in chair;with call bell/phone within reach  OT Visit Diagnosis: Unsteadiness on feet (R26.81);Other abnormalities of gait and mobility (R26.89);Repeated falls (R29.6);Muscle weakness (generalized) (M62.81)  Time: 9518-8416 OT Time Calculation (min): 30 min Charges:  OT General Charges $OT Visit: 1 Visit OT Evaluation $OT Eval Moderate Complexity: 1 Mod OT Treatments $Self Care/Home Management : 8-22 mins  Presley Raddle OTR/L  Acute Rehab Services  [phone] office number   Alphia Moh 05/31/2023, 2:56 PM

## 2023-05-31 NOTE — Progress Notes (Signed)
 CARDIAC REHAB PHASE I   PRE:  Rate/Rhythm: 102 ST    BP: sitting 118/71    SpO2: 91 RA  MODE:  Ambulation: 370 ft   POST:  Rate/Rhythm: 124 ST    BP: sitting 154/77     SpO2: 91 RA  Pt needing reiteration of sternal precautions. Able to stand independently and ambulate with RW. DOE, rest x2. SpO2 lowest 89 RA, HR 124 ST. Return to recliner. Practiced IS, 500-700 ml. Discussed importance of sternal precautions, IS, walking x3, recliner, eating. Sister is and helpful. Pt plans to d/c to cousin's for atleast a week. Encouraged feet elevation.  [id number]   Ethelda Chick BS, ACSM-CEP 05/31/2023 11:42 AM

## 2023-05-31 NOTE — Evaluation (Signed)
 Physical Therapy Evaluation Patient Details Name: Frederick Pace. MRN: [id number] DOB: 28-Jul-1950 Today's Date: 05/31/2023  History of Present Illness  73 y.o. male presents to [facility] 05/28/23 for CABG x4. ICU stay from 3/5-3/7. PMHx: HTN, hyperlipidemia, mild aortic insufficiency, left main and three-vessel coronary disease, right bundle branch block, exertional angina, single kidney, renal artery stenosis, renovascular hypertension, cervical disc disease, and arthritis    Clinical Impression  Pt in recliner upon arrival and agreeable to PT eval. PTA, pt was independent with no AD. Pt was able to recall sternal precautions and how to perform bed mobility. Pt was ModI for bed mobility, to stand, ambulate ~250 ft, and negotiate 4 steps. Pt preferred using a RW for mobility for comfort, however, was able to ambulate with no AD. Pt would benefit from continued follow-up with cardiac rehab and mobility specialists while in the hospital. Pt has no acute or post acute PT needs at this time. Pt will have 24/7 physical assist available with plan to discharge to his aunt's house. Pt has no further questions or concerns. Acute PT signing off.   HR varied from 110 BPM-130 BPM with activity         If plan is discharge home, recommend the following: Assist for transportation;Help with stairs or ramp for entrance   Can travel by private vehicle    Yes    Equipment Recommendations None recommended by PT     Functional Status Assessment Patient has had a recent decline in their functional status and demonstrates the ability to make significant improvements in function in a reasonable and predictable amount of time.     Precautions / Restrictions Precautions Precautions: Fall;Sternal Precaution Booklet Issued: No (book in room) Recall of Precautions/Restrictions: Intact Restrictions Weight Bearing Restrictions Per Provider Order: Yes Other Position/Activity Restrictions: Sternal prec       Mobility  Bed Mobility Overal bed mobility: Independent   Transfers Overall transfer level: Modified independent Equipment used: None    General transfer comment: able to stand without using UE    Ambulation/Gait Ambulation/Gait assistance: Modified independent (Device/Increase time) Gait Distance (Feet): 250 Feet Assistive device: Rolling walker (2 wheels), None Gait Pattern/deviations: Step-through pattern     General Gait Details: steady gait, able to ambulate with no AD and RW. Prefers RW for comfort  Stairs Stairs: Yes Stairs assistance: Modified independent (Device/Increase time) Stair Management: One rail Left, Alternating pattern, Forwards, Step to pattern Number of Stairs: 4 General stair comments: able to ascend/descend 4 steps with lightly holding onto rail for adherance to sternal precautions. Step through to ascend and step to to descend    Balance Overall balance assessment: No apparent balance deficits (not formally assessed)        Pertinent Vitals/Pain Pain Assessment Pain Assessment: No/denies pain    Home Living Family/patient expects to be discharged to:: Private residence Living Arrangements: Other relatives (aunt and cousins) Available Help at Discharge: Family;Available 24 hours/day Type of Home: House Home Access: Stairs to enter Entrance Stairs-Rails: Left Entrance Stairs-Number of Steps: 3   Home Layout: Two level;Full bath on main level;Able to live on main level with bedroom/bathroom Home Equipment: Rolling Walker (2 wheels) Additional Comments: info for aunt's house    Prior Function Prior Level of Function : Independent/Modified Independent      Mobility Comments: Ind with no AD ADLs Comments: Ind     Extremity/Trunk Assessment   Upper Extremity Assessment Upper Extremity Assessment: Defer to OT evaluation    Lower Extremity  Assessment Lower Extremity Assessment: Overall WFL for tasks assessed    Cervical / Trunk  Assessment Cervical / Trunk Assessment: Normal  Communication   Communication Communication: Impaired Factors Affecting Communication: Hearing impaired    Cognition Arousal: Alert Behavior During Therapy: WFL for tasks assessed/performed   PT - Cognitive impairments: No apparent impairments        Following commands: Intact       Cueing Cueing Techniques: Verbal cues      PT Assessment Patient does not need any further PT services   AM-PAC PT "6 Clicks" Mobility  Outcome Measure Help needed turning from your back to your side while in a flat bed without using bedrails?: None Help needed moving from lying on your back to sitting on the side of a flat bed without using bedrails?: None Help needed moving to and from a bed to a chair (including a wheelchair)?: None Help needed standing up from a chair using your arms (e.g., wheelchair or bedside chair)?: None Help needed to walk in hospital room?: None Help needed climbing 3-5 steps with a railing? : None 6 Click Score: 24    End of Session   Activity Tolerance: Patient tolerated treatment well Patient left: in chair;with call bell/phone within reach Nurse Communication: Mobility status PT Visit Diagnosis: Other abnormalities of gait and mobility (R26.89)    Time: 7829-5621 PT Time Calculation (min) (ACUTE ONLY): 16 min   Charges:   PT Evaluation $PT Eval Low Complexity: 1 Low   PT General Charges $$ ACUTE PT VISIT: 1 Visit        Hilton Cork, PT, DPT Secure Chat Preferred  Rehab Office [phone]   Arturo Morton Brion Aliment 05/31/2023, 2:12 PM

## 2023-06-01 LAB — BASIC METABOLIC PANEL
Anion gap: 12 (ref 5–15)
BUN: 17 mg/dL (ref 8–23)
CO2: 24 mmol/L (ref 22–32)
Calcium: 8.1 mg/dL — ABNORMAL LOW (ref 8.9–10.3)
Chloride: 103 mmol/L (ref 98–111)
Creatinine, Ser: 0.91 mg/dL (ref 0.61–1.24)
GFR, Estimated: >60 mL/min (ref >60)
Glucose, Bld: 118 mg/dL — ABNORMAL HIGH (ref 70–99)
Potassium: 3.3 mmol/L — ABNORMAL LOW (ref 3.5–5.1)
Sodium: 139 mmol/L (ref 135–145)

## 2023-06-01 LAB — CBC
HCT: 23.6 % — ABNORMAL LOW (ref 39.0–52.0)
Hemoglobin: 7.9 g/dL — ABNORMAL LOW (ref 13.0–17.0)
MCH: 30.9 pg (ref 26.0–34.0)
MCHC: 33.5 g/dL (ref 30.0–36.0)
MCV: 92.2 fL (ref 80.0–100.0)
Platelets: 159 10*3/uL (ref 150–400)
RBC: 2.56 MIL/uL — ABNORMAL LOW (ref 4.22–5.81)
RDW: 13.7 % (ref 11.5–15.5)
WBC: 10.8 10*3/uL — ABNORMAL HIGH (ref 4.0–10.5)
nRBC: 0.2 % (ref 0.0–0.2)

## 2023-06-01 LAB — MAGNESIUM: Magnesium: 1.7 mg/dL (ref 1.7–2.4)

## 2023-06-01 MED ORDER — GUAIFENESIN ER 600 MG PO TB12
600.0000 mg | ORAL_TABLET | Freq: Two times a day (BID) | ORAL | Status: DC
  Administered 2023-06-01 – 2023-06-02 (×3): 600 mg via ORAL
  Filled 2023-06-01 (×3): qty 1

## 2023-06-01 MED ORDER — METOPROLOL SUCCINATE ER 100 MG PO TB24
100.0000 mg | ORAL_TABLET | Freq: Every day | ORAL | Status: DC
  Administered 2023-06-02: 100 mg via ORAL
  Filled 2023-06-01: qty 1

## 2023-06-01 NOTE — TOC Progression Note (Signed)
 Transition of Care (TOC) - Progression Note    Patient Details  Name: Frederick Pace. MRN: [id number] Date of Birth: April 04, 1950  [facility] of [facility]) CM/SW Contact  Ronny Bacon, RN Phone Number: 06/01/2023, 1:05 PM  Clinical Narrative:  DME order for rolling walker noted. Spoke with patient reports that he does not feel he needs a rolling walker at this time. He does have access to rolling walker at home through family who is not using theirs.     Expected Discharge Plan: Home w Home Health Services Barriers to Discharge: Continued Medical Work up  Expected Discharge Plan and Services   Discharge Planning Services: CM Consult Post Acute Care Choice: Home Health Living arrangements for the past 2 months: Single Family Home                           HH Arranged: PT           Social Determinants of Health (SDOH) Interventions SDOH Screenings   Food Insecurity: No Food Insecurity (05/29/2023)  Housing: Low Risk  (05/29/2023)  Transportation Needs: No Transportation Needs (05/29/2023)  Utilities: Not At Risk (05/29/2023)  Alcohol Screen: Low Risk  (08/13/2022)  Depression (PHQ2-9): Low Risk  (02/24/2023)  Financial Resource Strain: Low Risk  (08/13/2022)  Physical Activity: Insufficiently Active (08/13/2022)  Social Connections: Socially Isolated (05/30/2023)  Stress: No Stress Concern Present (08/13/2022)  Tobacco Use: Low Risk  (05/28/2023)    Readmission Risk Interventions     No data to display

## 2023-06-01 NOTE — Progress Notes (Addendum)
 [address]       [place] [address]             [phone]      4 Days Post-Op Procedure(s) (LRB): CORONARY ARTERY BYPASS GRAFTING TIMES FOUR USING LEFT INTERNAL MAMMARY ARTERY AND ENDOSCOPICALLY HARVESTED RIGHT GREAT SAPHENOUS VEIN; RIGHT CORONARY ENDARTERECTOMY (N/A) ECHOCARDIOGRAM, TRANSESOPHAGEAL (N/A) Subjective: The patient states he is feeling okay this morning, he feels better than yesterday. He does admit to some congestion. Sitting up in the chair using incentive spirometer.   Objective: Vital signs in last 24 hours: Temp:  [97.7 F (36.5 C)-98.3 F (36.8 C)] 98.3 F (36.8 C) (03/09 0508) Pulse Rate:  [97-103] 98 (03/09 0508) Cardiac Rhythm: Normal sinus rhythm;Bundle branch block (03/08 1900) Resp:  [20] 20 (03/09 0508) BP: (118-152)/(71-92) 137/76 (03/09 0508) SpO2:  [90 %-95 %] 95 % (03/09 0508) Weight:  [87.5 kg] 87.5 kg (03/09 0440)  Hemodynamic parameters for last 24 hours:    Intake/Output from previous day: 03/08 0701 - 03/09 0700 In: -  Out: 300 [Urine:300] Intake/Output this shift: No intake/output data recorded.  General appearance: alert, cooperative, and no distress Neurologic: intact Heart: regular rate and rhythm, S1, S2 normal, no murmur, click, rub or gallop Lungs: slightly diminished left basilar breath sounds Abdomen: soft, non-tender; bowel sounds normal; no masses,  no organomegaly Extremities: edema 1+ BUE and BLE, TED hose in place Wound: Clean and dry without sign of infection  Lab Results: Recent Labs    05/31/23 0259 06/01/23 0502  WBC 10.8* 10.8*  HGB 7.2* 7.9*  HCT 21.6* 23.6*  PLT 126* 159   BMET:  Recent Labs    05/30/23 0455 05/31/23 0259  NA 136 136  K 3.9 3.5  CL 108 103  CO2 21* 24  GLUCOSE 109* 115*  BUN 18 17  CREATININE 1.06 0.98  CALCIUM 7.8* 8.0*    PT/INR: No results for input(s): "LABPROT", "INR" in the last 72 hours. ABG    Component Value Date/Time   PHART 7.333 (L)  05/28/2023 1658   HCO3 22.9 05/28/2023 1658   TCO2 24 05/28/2023 1658   ACIDBASEDEF 3.0 (H) 05/28/2023 1658   O2SAT 97 05/28/2023 1658   CBG (last 3)  Recent Labs    05/30/23 2100 05/31/23 0609 05/31/23 1105  GLUCAP 123* 105* 151*    Assessment/Plan: S/P Procedure(s) (LRB): CORONARY ARTERY BYPASS GRAFTING TIMES FOUR USING LEFT INTERNAL MAMMARY ARTERY AND ENDOSCOPICALLY HARVESTED RIGHT GREAT SAPHENOUS VEIN; RIGHT CORONARY ENDARTERECTOMY (N/A) ECHOCARDIOGRAM, TRANSESOPHAGEAL (N/A)  Neuro: Pain controlled   CV: On ASA, Plavix. Statin allergy, will restart Repatha at discharge. SBP 130s-150s, on Toprol XL 50mg  daily. NSR-ST, with many PVCs and some ventricular trigeminy. HR 90s-106, up to 130s with ambulation. Can likely tolerate continued Toprol titration.    Pulm: Saturating well on RA this AM. Last CXR with a small left pleural effusion and left basilar atelectasis. Continue Mucinex and diuresis. Encourage IS and ambulation. Will get follow up 2V CXR tomorrow.    GI: No nausea/vomiting. +BM. Tolerating a diet.    Endo: No hx of DM. SSI and CBGs have been d/c'd.   Renal: Cr 0.91, stable. UO 300cc/24hrs recorded. +2lbs from preop weight after 2 doses of PO Lasix 40mg  and Metolazone 2.5mg  yesterday. Continue PO Lasix 40mg  daily. Continue TED hose. K 3.3, supplement.    ID: Likely reactive leukocytosis stable, WBC 10.8. Tmax 98.3. Will clinically monitor.    Expected postop ABLA: H/H 7.9/23.6,  improving. Will continue to clinically monitor. On iron supplement. Reactive thrombocytopenia resolved.   DVT Prophylaxis: Lovenox   Dispo: Possibly discharge tomorrow AM    LOS: 4 days    Jenny Reichmann, PA-C 06/01/2023   Chart reviewed, patient examined, agree with above. Wt decreased 8 lbs from yesterday if accurate. Only about 3 lbs over preop if accurate. Still has some edema. Will likely need to go home on diuretic and K+

## 2023-06-02 ENCOUNTER — Inpatient Hospital Stay (HOSPITAL_COMMUNITY)

## 2023-06-02 ENCOUNTER — Other Ambulatory Visit (HOSPITAL_COMMUNITY): Payer: Self-pay

## 2023-06-02 LAB — BASIC METABOLIC PANEL
Anion gap: 10 (ref 5–15)
BUN: 22 mg/dL (ref 8–23)
CO2: 24 mmol/L (ref 22–32)
Calcium: 8.2 mg/dL — ABNORMAL LOW (ref 8.9–10.3)
Chloride: 105 mmol/L (ref 98–111)
Creatinine, Ser: 0.98 mg/dL (ref 0.61–1.24)
GFR, Estimated: >60 mL/min (ref >60)
Glucose, Bld: 110 mg/dL — ABNORMAL HIGH (ref 70–99)
Potassium: 3.9 mmol/L (ref 3.5–5.1)
Sodium: 139 mmol/L (ref 135–145)

## 2023-06-02 MED ORDER — FUROSEMIDE 40 MG PO TABS
40.0000 mg | ORAL_TABLET | Freq: Every day | ORAL | 0 refills | Status: DC
  Filled 2023-06-02: qty 7, 7d supply, fill #0

## 2023-06-02 MED ORDER — TRAMADOL HCL 50 MG PO TABS
50.0000 mg | ORAL_TABLET | Freq: Four times a day (QID) | ORAL | 0 refills | Status: AC | PRN
Start: 2023-06-02 — End: 2023-06-09
  Filled 2023-06-02: qty 28, 7d supply, fill #0

## 2023-06-02 MED ORDER — CLOPIDOGREL BISULFATE 75 MG PO TABS
75.0000 mg | ORAL_TABLET | Freq: Every day | ORAL | 1 refills | Status: DC
  Filled 2023-06-02: qty 30, 30d supply, fill #0

## 2023-06-02 MED ORDER — POTASSIUM CHLORIDE CRYS ER 20 MEQ PO TBCR
20.0000 meq | EXTENDED_RELEASE_TABLET | Freq: Every day | ORAL | 0 refills | Status: DC
Start: 2023-06-02 — End: 2023-06-23
  Filled 2023-06-02: qty 7, 7d supply, fill #0

## 2023-06-02 MED ORDER — METOPROLOL SUCCINATE ER 100 MG PO TB24
100.0000 mg | ORAL_TABLET | Freq: Every day | ORAL | 1 refills | Status: DC
  Filled 2023-06-02: qty 30, 30d supply, fill #0

## 2023-06-02 MED ORDER — GUAIFENESIN ER 600 MG PO TB12: 600.0000 mg | ORAL_TABLET | Freq: Two times a day (BID) | ORAL | Status: DC | PRN

## 2023-06-02 MED ORDER — LOSARTAN POTASSIUM 25 MG PO TABS
25.0000 mg | ORAL_TABLET | Freq: Every day | ORAL | 2 refills | Status: DC
Start: 2023-06-02 — End: 2023-08-01
  Filled 2023-06-02: qty 30, 30d supply, fill #0

## 2023-06-02 MED ORDER — FE FUM-VIT C-VIT B12-FA 460-60-0.01-1 MG PO CAPS
1.0000 | ORAL_CAPSULE | Freq: Every day | ORAL | 0 refills | Status: DC
Start: 2023-06-02 — End: 2023-06-24
  Filled 2023-06-02: qty 30, 30d supply, fill #0

## 2023-06-02 NOTE — TOC Transition Note (Signed)
 Transition of Care [facility]) - Discharge Note Donn Pierini RN, BSN Transitions of Care Unit 4E- RN Case Manager See Treatment Team for direct phone #   Patient Details  Name: Frederick Pace. MRN: [id number] Date of Birth: [birth year]  Transition [facility]) CM/SW Contact:  Darrold Span, RN Phone Number: 06/02/2023, 9:56 AM   Clinical Narrative:    Pt stable for transition home today, pt has declined DME needs-voiced he has RW at home, and updated therapy recs note no HH f/u needed.   TCTS office had made referral to [facility] for any HH needs- CM has notified liaison that pt has no HH needs on discharge.   Pt has transportation home,   No further TOC needs noted.    Final next level of care: Home/Self Care Barriers to Discharge: Barriers Resolved   Patient Goals and CMS Choice Patient states their goals for this hospitalization and ongoing recovery are:: return home CMS Medicare.gov Compare Post Acute Care list provided to:: Patient Choice offered to / list presented to : Patient [facility] [place] ownership interest in [facility].provided to:: Patient    Discharge Placement                 Home      Discharge Plan and Services Additional resources added to the After Visit Summary for     Discharge Planning Services: CM Consult Post Acute Care Choice: Home Health          DME Arranged: N/A         HH Arranged: PT HH Agency: NA        Social Drivers of Health (SDOH) Interventions SDOH Screenings   Food Insecurity: No Food Insecurity (05/29/2023)  Housing: Low Risk  (05/29/2023)  Transportation Needs: No Transportation Needs (05/29/2023)  Utilities: Not At Risk (05/29/2023)  Alcohol Screen: Low Risk  (08/13/2022)  Depression (PHQ2-9): Low Risk  (02/24/2023)  Financial Resource Strain: Low Risk  (08/13/2022)  Physical Activity: Insufficiently Active (08/13/2022)  Social Connections: Socially Isolated (06/02/2023)  Stress: No Stress Concern Present  (08/13/2022)  Tobacco Use: Low Risk  (05/28/2023)     Readmission Risk Interventions    06/02/2023    9:56 AM  Readmission Risk Prevention Plan  Post Dischage Appt Complete  Medication Screening Complete  Transportation Screening Complete

## 2023-06-02 NOTE — Progress Notes (Signed)
 Patient given discharge instructions, medication list and follow up appointments. IV and tele were dcd. All questions answered at this time. Will contact discharge lounge for transport and to pick up TOC meds. Will discharge as ordered. Damiah Mcdonald, Randall An RN

## 2023-06-02 NOTE — Progress Notes (Signed)
      [address]       [place] [address]             [phone]      5 Days Post-Op Procedure(s) (LRB): CORONARY ARTERY BYPASS GRAFTING TIMES FOUR USING LEFT INTERNAL MAMMARY ARTERY AND ENDOSCOPICALLY HARVESTED RIGHT GREAT SAPHENOUS VEIN; RIGHT CORONARY ENDARTERECTOMY (N/A) ECHOCARDIOGRAM, TRANSESOPHAGEAL (N/A) Subjective: Awake and alert, had a good day yesterday. No new concerns. Feels he is ready to return home.   Objective: Vital signs in last 24 hours: Temp:  [97.7 F (36.5 C)-98.6 F (37 C)] 98.6 F (37 C) (03/10 0255) Pulse Rate:  [92-104] 92 (03/10 0255) Cardiac Rhythm: Sinus tachycardia;Bundle branch block (03/09 1943) Resp:  [18-23] 20 (03/10 0428) BP: (116-145)/(75-89) 143/78 (03/10 0255) SpO2:  [93 %-97 %] 94 % (03/10 0255) Weight:  [86.6 kg] 86.6 kg (03/10 0427)    Intake/Output from previous day: 03/09 0701 - 03/10 0700 In: 120 [P.O.:120] Out: -  Intake/Output this shift: No intake/output data recorded.  General appearance: alert, cooperative, and no distress Neurologic: intact Heart: RRR, SR with few PVC's Lungs: normal work of breathing on RA, stable sats Abdomen: soft, no tenderness Extremities: mild LE edema, has TED hose in place Wound: The sternotomy incision and EVH incision are intact and dry.   Lab Results: Recent Labs    05/31/23 0259 06/01/23 0502  WBC 10.8* 10.8*  HGB 7.2* 7.9*  HCT 21.6* 23.6*  PLT 126* 159   BMET:  Recent Labs    06/01/23 0502 06/02/23 0454  NA 139 139  K 3.3* 3.9  CL 103 105  CO2 24 24  GLUCOSE 118* 110*  BUN 17 22  CREATININE 0.91 0.98  CALCIUM 8.1* 8.2*    PT/INR: No results for input(s): "LABPROT", "INR" in the last 72 hours. ABG    Component Value Date/Time   PHART 7.333 (L) 05/28/2023 1658   HCO3 22.9 05/28/2023 1658   TCO2 24 05/28/2023 1658   ACIDBASEDEF 3.0 (H) 05/28/2023 1658   O2SAT 97 05/28/2023 1658   CBG (last 3)  Recent Labs    05/30/23 2100 05/31/23 0609  05/31/23 1105  GLUCAP 123* 105* 151*    Assessment/Plan: S/P Procedure(s) (LRB): CORONARY ARTERY BYPASS GRAFTING TIMES FOUR USING LEFT INTERNAL MAMMARY ARTERY AND ENDOSCOPICALLY HARVESTED RIGHT GREAT SAPHENOUS VEIN; RIGHT CORONARY ENDARTERECTOMY (N/A) ECHOCARDIOGRAM, TRANSESOPHAGEAL (N/A)  -POD5 CABG x 4 for LM and MVCAD and preserved EF. On ASA, Plavix, Toprol, Repatha (statin intolerance).  Making expected progression, now independent with mobility and tolerating PO's.  -HTN- MAP in mid 90's, on Toprol XL 100 daily.  Will add back ARB at lower dose ant this can be titrated in the outpatient setting.   -RENAL-normal function, Wt about 1kg below pre-op but has some peripheral edema.  Continue Lasix for a week post discharge.   -GI- tolerating PO's and having bowel function.  -PULM- On RA,  CXR this am showing a small left pleural effusion.  Continuing diuresis and encouraged daily use of the IS at home.   -Disposition- Discharge to home today. Instructions given and follow up arranged. He has a rolling walker available for home use.     LOS: 5 days    Leary Roca, PA-C [phone] 06/02/2023

## 2023-06-02 NOTE — Progress Notes (Signed)
 CARDIAC REHAB PHASE I     Post OHS education including site care, restrictions, heart healthy diet, sternal precautions, IS use at home, home needs at discharge, exercise guidelines and CRP2 reviewed. All questions and concerns addressed. Will refer to [facility] for CRP2.   [phone] Woodroe Chen, RN BSN 06/02/2023 9:11 AM

## 2023-06-02 NOTE — Care Management Important Message (Signed)
 Important Message  Patient Details  Name: Frederick Pace. MRN: [id number] Date of Birth: 1951/01/22   Important Message Given:  Yes - Medicare IM     Renie Ora 06/02/2023, 11:01 AM

## 2023-06-02 NOTE — Care Management Important Message (Signed)
 Important Message  Patient Details  Name: Frederick Pace. MRN: [id number] Date of Birth: Aug 08, 1950   Important Message Given:  Yes - Medicare IM     Renie Ora 06/02/2023, 10:25 AM

## 2023-06-03 ENCOUNTER — Telehealth: Payer: Self-pay

## 2023-06-03 NOTE — Transitions of Care (Post Inpatient/ED Visit) (Signed)
 06/03/2023  Name: Frederick Pace. MRN: [id number] DOB: May 15, 1950  Today's TOC FU Call Status: Today's TOC FU Call Status:: Successful TOC FU Call Completed TOC FU Call Complete Date: 06/03/23 Patient's Name and Date of Birth confirmed.  Transition Care Management Follow-up Telephone Call Date of Discharge: 06/02/23 Discharge Facility: [facility]) Type of Discharge: Inpatient Admission Primary Inpatient Discharge Diagnosis:: CABG X 4 How have you been since you were released from the hospital?: Better Any questions or concerns?: No  Items Reviewed: Did you receive and understand the discharge instructions provided?: Yes Medications obtained,verified, and reconciled?: Yes (Medications Reviewed) Any new allergies since your discharge?: No Dietary orders reviewed?: Yes Type of Diet Ordered:: Heart Healthy Do you have support at home?: No  Medications Reviewed Today: Medications Reviewed Today     Reviewed by Redge Gainer, RN (Case Manager) on 06/03/23 at 1035  Med List Status: <None>   Medication Order Taking? Sig Documenting Provider Last Dose Status Informant  aspirin EC 81 MG tablet [id number] No Take 1 tablet (81 mg total) by mouth daily. Swallow whole. Yvonne Kendall, MD 05/28/2023  5:00 AM Active Self  clopidogrel (PLAVIX) 75 MG tablet [id number]  Take 1 tablet (75 mg total) by mouth daily. Leary Roca, PA-C  Active   Evolocumab (REPATHA SURECLICK) 140 MG/ML Ivory Broad [id number] No Inject 140 mg into the skin every 14 (fourteen) days. End, Cristal Deer, MD Past Week Active Self  Fe Fum-Vit C-Vit B12-FA (TRIGELS-F FORTE) CAPS capsule [id number]  Take 1 capsule by mouth daily after breakfast. Leary Roca, PA-C  Active   furosemide (LASIX) 40 MG tablet [id number]  Take 1 tablet (40 mg total) by mouth daily. Leary Roca, PA-C  Active   guaiFENesin (MUCINEX) 600 MG 12 hr tablet [id number]  Take 1 tablet (600 mg total) by mouth 2 (two) times daily as  needed. Leary Roca, PA-C  Active   losartan (COZAAR) 25 MG tablet [id number]  Take 1 tablet (25 mg total) by mouth daily. Leary Roca, PA-C  Active   metoprolol succinate (TOPROL-XL) 100 MG 24 hr tablet [id number]  Take 1 tablet (100 mg total) by mouth daily. Take with or immediately following a meal. Roddenberry, Myron G, PA-C  Active   potassium chloride SA (KLOR-CON M) 20 MEQ tablet [id number]  Take 1 tablet (20 mEq total) by mouth daily. Leary Roca, PA-C  Active   traMADol (ULTRAM) 50 MG tablet [id number]  Take 1 tablet (50 mg total) by mouth every 6 (six) hours as needed for up to 7 days for moderate pain (pain score 4-6) or severe pain (pain score 7-10). Leary Roca, PA-C  Active             Home Care and Equipment/Supplies: Were Home Health Services Ordered?: NA Any new equipment or medical supplies ordered?: NA  Functional Questionnaire: Do you need assistance with bathing/showering or dressing?: No Do you need assistance with meal preparation?: No Do you need assistance with eating?: No Do you have difficulty maintaining continence: No Do you need assistance with getting out of bed/getting out of a chair/moving?: No Do you have difficulty managing or taking your medications?: No  Follow up appointments reviewed: PCP Follow-up appointment confirmed?: NA (The patient declines) Specialist Hospital Follow-up appointment confirmed?: Yes Date of Specialist follow-up appointment?: 06/23/23 Follow-Up Specialty Provider:: Dr. Fransico Michael Do you need transportation to your follow-up appointment?: No Do you understand care options if your condition(s) worsen?: Yes-patient verbalized understanding  SDOH  Interventions Today    Flowsheet Row Most Recent Value  SDOH Interventions   Food Insecurity Interventions Intervention Not Indicated  Housing Interventions Intervention Not Indicated  Transportation Interventions Intervention Not Indicated  Utilities  Interventions Intervention Not Indicated      Interventions Today    Flowsheet Row Most Recent Value  Chronic Disease   Chronic disease during today's visit Hypertension (HTN)  General Interventions   General Interventions Discussed/Reviewed General Interventions Discussed, General Interventions Reviewed, Doctor Visits  Doctor Visits Discussed/Reviewed Doctor Visits Reviewed, Doctor Visits Discussed  Exercise Interventions   Exercise Discussed/Reviewed Physical Activity  Physical Activity Discussed/Reviewed Physical Activity Discussed  Education Interventions   Education Provided Provided Education  Provided Verbal Education On Exercise, When to see the doctor, Insurance Plans  Pharmacy Interventions   Pharmacy Dicussed/Reviewed Medications and their functions       The patient has been provided with contact information for the care management team and has been advised to call with any health-related questions or concerns. The patient verbalized understanding with current POC. The patient is directed to their insurance card regarding availability of benefits coverage.   Deidre Ala, BSN, RN [address]  [employer] - [employer] [facility] RN Care Manager [phone]

## 2023-06-03 NOTE — Transitions of Care (Post Inpatient/ED Visit) (Signed)
   06/03/2023  Name: Frederick Pace. MRN: [id number] DOB: [birth year]  Today's TOC FU Call Status: Today's TOC FU Call Status:: Unsuccessful Call (1st Attempt) Unsuccessful Call (1st Attempt) Date: 06/03/23  Attempted to reach the patient regarding the most recent Inpatient/ED visit.  Follow Up Plan: Additional outreach attempts will be made to reach the patient to complete the Transitions of Care (Post Inpatient/ED visit) call.   Deidre Ala, BSN, RN [place]  [facility] - [employer] [facility] RN Care Manager [phone]

## 2023-06-04 MED FILL — Heparin Sodium (Porcine) Inj 1000 Unit/ML: INTRAMUSCULAR | Qty: 10 | Status: AC

## 2023-06-04 MED FILL — Electrolyte-R (PH 7.4) Solution: INTRAVENOUS | Qty: 4000 | Status: AC

## 2023-06-04 MED FILL — Sodium Chloride IV Soln 0.9%: INTRAVENOUS | Qty: 2000 | Status: AC

## 2023-06-04 MED FILL — Sodium Bicarbonate IV Soln 8.4%: INTRAVENOUS | Qty: 50 | Status: AC

## 2023-06-04 MED FILL — Mannitol IV Soln 20%: INTRAVENOUS | Qty: 500 | Status: AC

## 2023-06-10 NOTE — Progress Notes (Unsigned)
 [address]       [place] [address]             [phone]      HPI: Mr. Dufresne is a 73 year old male with a past medical history of hypertension, hyperlipidemia, mild aortic insufficiency, left main and three-vessel coronary disease, right bundle branch block, exertional angina, single kidney, renal artery stenosis, renovascular hypertension, cervical disc disease, and arthritis. The patient returns for routine postoperative follow-up having undergone CABG x 4 and endarterectomy of the RCA by Dr. Dorris Fetch on 05/28/23. The patient had a routine postoperative hospital stay. He did have a small left pleural effusion postoperatively and was discharged on Lasix. He was discharged in stable condition on 06/02/23 He followed up with cardiology on 03/31 and was doing well with improvement in edema on Lasix which was discontinued. The patient also reported he was not interested in cardiac rehab since he is active at home.  Since hospital discharge the patient denies chest pain, shortness of breath, dizziness and LOC. He does admit to flatus but denies constipation. He reports that his leg swelling has improved but his right calf is still bruised and sore at times around the [facility] site but this has improved with time. He does measure his blood pressure at home and it is usually 130s/70s.    Current Outpatient Medications  Medication Sig Dispense Refill   aspirin EC 81 MG tablet Take 1 tablet (81 mg total) by mouth daily. Swallow whole.     clopidogrel (PLAVIX) 75 MG tablet Take 1 tablet (75 mg total) by mouth daily. 30 tablet 1   Evolocumab (REPATHA SURECLICK) 140 MG/ML SOAJ Inject 140 mg into the skin every 14 (fourteen) days. 6 mL 3   Fe Fum-Vit C-Vit B12-FA (TRIGELS-F FORTE) CAPS capsule Take 1 capsule by mouth daily after breakfast. 30 capsule 0   furosemide (LASIX) 40 MG tablet Take 1 tablet (40 mg total) by mouth daily. 7 tablet 0   guaiFENesin (MUCINEX) 600 MG 12 hr  tablet Take 1 tablet (600 mg total) by mouth 2 (two) times daily as needed.     losartan (COZAAR) 25 MG tablet Take 1 tablet (25 mg total) by mouth daily. 30 tablet 2   metoprolol succinate (TOPROL-XL) 100 MG 24 hr tablet Take 1 tablet (100 mg total) by mouth daily. Take with or immediately following a meal. 30 tablet 1   potassium chloride SA (KLOR-CON M) 20 MEQ tablet Take 1 tablet (20 mEq total) by mouth daily. 7 tablet 0   No current facility-administered medications for this visit.   Vitals: Today's Vitals   06/24/23 1221  BP: (!) 143/87  Pulse: 80  Resp: 18  SpO2: 96%  Weight: 181 lb (82.1 kg)  Height: 5\' 4"  (1.626 m)   Body mass index is 31.07 kg/m.  Physical Exam: General: Alert and oriented, no acute distress Neuro: Grossly intact CV: Regular rate and rhythm, no murmur Pulm: Diminished breath sounds right base, otherwise clear to auscultation GI: +BS, nontender, no distension Extremities: Trace edema BLE, ecchymosis with likely hematoma of the right calf near [facility] site Wound: Incisions are healing well without sign of infection. No erythema or drainage.   Diagnostic Tests: CLINICAL DATA:  recent CABG, 05/28/2023   EXAM: CHEST - 2 VIEW   COMPARISON:  June 02, 2023   FINDINGS: Mild cardiomegaly. Tortuous aorta. CABG markers and sternotomy wires. Low lung volumes with bronchovascular crowding. Biapical pleural thickening. Moderate  volume left pleural effusion with adjacent atelectasis. No pneumothorax.   IMPRESSION: Unchanged moderate volume left pleural effusion. Trace right pleural effusion.     Electronically Signed   By: Wallie Char M.D.   On: 06/24/2023 12:11  Impression/Plan: S/P CABG x 4: Overall the patient is doing very well postoperatively. He is ambulating well and active at home. He denies chest pain and has not taking any narcotic pain medication.  He does report excess gas but denies constipation. His H/H looked good on labs ordered by  cardiology yesterday so I cleared him to discontinue his iron supplement. His incisions are healing well without sign of infection. He does have ecchymosis of the right calf around [facility] site and a possible small hematoma. This should improve with time and I told him he could heat the area if it relieves discomfort. He does report the bruising and discomfort has improved since hospital discharge and this was not tender to palpation on exam. His sternal incision has healed well without sign of infection. I cleared him to drive today but he reports he has been driving without difficulty and has been working for the last few weeks. I cleared him for cardiac rehab but the patient was not interested since he reports he is active at home. We did also discuss continued sternal precautions including no heavy lifting for 3 months. If IR is unable to drain his left pleural effusion I will prescribe a diuretic regimen and have the patient return to our clinic with a CXR to monitor pleural effusion. Otherwise plan to have the patient return to the clinic as needed.   Left pleural effusion:  The patient denies shortness of breath and cough. He is saturating 96% on RA today and is no acute distress. His weight is down 4kg from discharge and edema has improved. Lasix was discontinued yesterday by cardiology but he had only been prescribed 7 days of Lasix at discharge which would have ended on 03/17. On CXR today he has a moderate left pleural effusion that has worsened since hospital discharge. I will refer him to IR for left thoracentesis. If IR is unable to drain his left pleural effusion I will prescribe a diuretic regimen and have the patient return to our clinic with a CXR to monitor pleural effusion.   HTN: He is hypertensive during today's visit but this was improved at his cardiology appointment yesterday and he reports it is usually 130s/70s when he measures it at home. I will continue Toprol XL 100mg  daily and Losartan  25mg  daily but told him to reach out to his cardiologist if this remains elevated with a systolic pressure in the 140s or higher.   Jenny Reichmann, PA-C [facility] [phone]

## 2023-06-23 ENCOUNTER — Encounter: Payer: Self-pay | Admitting: Medical

## 2023-06-23 ENCOUNTER — Ambulatory Visit: Attending: Medical | Admitting: Medical

## 2023-06-23 ENCOUNTER — Other Ambulatory Visit: Payer: Self-pay | Admitting: Thoracic Surgery (Cardiothoracic Vascular Surgery)

## 2023-06-23 VITALS — BP 130/78 | HR 91 | Ht 64.0 in | Wt 180.4 lb

## 2023-06-23 DIAGNOSIS — I5032 Chronic diastolic (congestive) heart failure: Secondary | ICD-10-CM | POA: Diagnosis not present

## 2023-06-23 DIAGNOSIS — I10 Essential (primary) hypertension: Secondary | ICD-10-CM | POA: Diagnosis not present

## 2023-06-23 DIAGNOSIS — Z951 Presence of aortocoronary bypass graft: Secondary | ICD-10-CM | POA: Diagnosis not present

## 2023-06-23 DIAGNOSIS — E782 Mixed hyperlipidemia: Secondary | ICD-10-CM

## 2023-06-23 DIAGNOSIS — I25118 Atherosclerotic heart disease of native coronary artery with other forms of angina pectoris: Secondary | ICD-10-CM | POA: Diagnosis not present

## 2023-06-23 DIAGNOSIS — Z79899 Other long term (current) drug therapy: Secondary | ICD-10-CM

## 2023-06-23 DIAGNOSIS — I2510 Atherosclerotic heart disease of native coronary artery without angina pectoris: Secondary | ICD-10-CM

## 2023-06-23 NOTE — Patient Instructions (Signed)
 Continue to avoid any heavy lifting or strenuous use of your arms or shoulders for at least a total of three months from the time of surgery.  After three months you may gradually increase how much you lift or otherwise use your arms or chest as tolerated, with limits based upon whether or not activities lead to the return of significant discomfort.  You may return to driving an automobile as long as you are no longer requiring oral narcotic pain relievers during the daytime.  It would be wise to start driving only short distances during the daylight and gradually increase from there as you feel comfortable.  You are encouraged to enroll and participate in the outpatient cardiac rehab program beginning as soon as practical.

## 2023-06-23 NOTE — Progress Notes (Signed)
 Cardiology Office Note:  .   Date:  06/23/2023  ID:  Frederick Pace., DOB 03/17/1951, MRN [id number] PCP: Sherlene Shams, MD  [facility] Providers Cardiologist:  Yvonne Kendall, MD {  History of Present Illness: Frederick Pace   Frederick Pace Frederick Pace. is a 73 y.o. male with a history of CAD s/p CABG x 4, hypertension, hyperlipidemia, mild aortic insufficiency, left main and three-vessel coronary disease, right bundle branch block, exertional angina, single kidney, renal artery stenosis, renovascular hypertension, cervical disc disease, and arthritis who presents for CABG follow-up.  Cardiac CT 11/2022 for shortness of breath with exertion showed coronary calcium score showed score of 1066 which was 86 percentile.  Cardiac cath showed 80 to 90% distal left main stenosis with chronic total occlusion of his RCA.  Echo showed EF 60 to 65%, grade 1 diastolic dysfunction.  He was seen by cardiovascular surgery and set up for CABG.  He underwent CABG x 4 utilizing LIMA to LAD, SVG to distal LAD, SVG to RCA and SVG to OM as well as RCA endarterectomy and endoscopic right greater saphenous vein harvest.  Postop care was fairly uncomplicated.  He was discharged on postop day 5.  Today, the patient is overall doing well since discharge. He feels he is back to normal function. He denies chest pain or shortness of breath. No chest soreness. Feels energy is getting better. He is not interested in cardiac rehab. He stays active a home, but does no formal activity or walking program. Swelling is going down as he is taking lasix.   Studies Reviewed: Frederick Pace   EKG Interpretation Date/Time:  Monday June 23 2023 08:11:32 EDT Ventricular Rate:  91 PR Interval:  192 QRS Duration:  144 QT Interval:  384 QTC Calculation: 472 R Axis:   231  Text Interpretation: Normal sinus rhythm Indeterminate axis Right bundle branch block When compared with ECG of 29-May-2023 07:11, No significant change was found Confirmed by Fransico Michael,  Marcos Peloso ([id number]) on 06/23/2023 8:13:58 AM    Intraoperative TEE 05/2023 Complications: No known complications during this procedure.  POST-OP IMPRESSIONS      s/p CABG x4  _ Left Ventricle: LVEF unchanged, CO > 4 L/min, CI > 2 L/min/m. No RWMA's  noted.  _ Right Ventricle: The right ventricle appears unchanged from pre-bypass.  _ Aorta: Unchanged. No dissection noted after cannula removed.  _ Left Atrium: The left atrium appears unchanged from pre-bypass.  _ Left Atrial Appendage: The left atrial appendage appears unchanged from  pre-bypass.  _ Aortic Valve: The aortic valve appears unchanged from pre-bypass.  _ Mitral Valve: The mitral valve appears unchanged from pre-bypass.  _ Tricuspid Valve: The tricuspid valve appears unchanged from pre-bypass.  _ Pulmonic Valve: The pulmonic valve appears unchanged from pre-bypass.  _ Interatrial Septum: The interatrial septum appears unchanged from  pre-bypass.  _ Interventricular Septum: The interventricular septum appears unchanged  from  pre-bypass.  _ Pericardium: The pericardium appears unchanged from pre-bypass.    Echo 01/2023  1. Left ventricular ejection fraction, by estimation, is 60 to 65%. The  left ventricle has normal function. The left ventricle has no regional  wall motion abnormalities. There is mild concentric left ventricular  hypertrophy. Left ventricular diastolic  parameters are consistent with Grade I diastolic dysfunction (impaired  relaxation). The average left ventricular global longitudinal strain is  -16.3 %. The global longitudinal strain is abnormal.   2. Right ventricular systolic function is normal. The right ventricular  size is  normal. There is normal pulmonary artery systolic pressure. The  estimated right ventricular systolic pressure is 23.1 mmHg.   3. The mitral valve is normal in structure. No evidence of mitral valve  regurgitation. No evidence of mitral stenosis.   4. The aortic valve is tricuspid.  There is moderate calcification of the  aortic valve. Aortic valve regurgitation is mild. Aortic valve  sclerosis/calcification is present, without any evidence of aortic  stenosis.   5. Aortic dilatation noted. There is mild dilatation of the aortic root,  measuring 41 mm.   6. The inferior vena cava is normal in size with greater than 50%  respiratory variability, suggesting right atrial pressure of 3 mmHg.   LHC 01/2023 Conclusions: Severe three-vessel coronary artery disease, including severe distal LMCA lesion involving ostia of LAD and LCx, as detailed below. Normal left ventricular systolic function and filling pressure.   Recommendations: Given severity of disease, admit for inpatient cardiac surgery consultation. Escalate rosuvastatin to 40 mg daily; continue aggressive secondary prevention of coronary artery disease. Obtain echocardiogram.   Yvonne Kendall, MD [facility]      Physical Exam:   VS:  BP 130/78 (BP Location: Left Arm, Patient Position: Sitting)   Pulse 91   Ht 5\' 4"  (1.626 m)   Wt 180 lb 6.4 oz (81.8 kg)   SpO2 98%   BMI 30.97 kg/m    Wt Readings from Last 3 Encounters:  06/23/23 180 lb 6.4 oz (81.8 kg)  06/02/23 191 lb (86.6 kg)  05/27/23 190 lb 8 oz (86.4 kg)    GEN: Well nourished, well developed in no acute distress NECK: No JVD; No carotid bruits CARDIAC: RRR, no murmurs, rubs, gallops RESPIRATORY:  Clear to auscultation without rales, wheezing or rhonchi  ABDOMEN: Soft, non-tender, non-distended EXTREMITIES:  No edema; No deformity   ASSESSMENT AND PLAN: .    CAD s/p CABG x4 Patiently recently underwent CABG x 4 with fairly uncomplicated postop period.  He is back home and overall recovering well.  He denies any anginal symptoms.  He is not interested in cardiac rehab and does no formal activity, but stays he stays active at home.  Surgical site looks good.  He has follow-up with CT surgery later this week.  Continue aspirin 81 mg,  Plavix 75 mg daily, Repatha, losartan 25 mg daily, Toprol 100 mg daily.  I will check a CBC and BMET today.  HTN Blood pressure is good today, continue losartan and Toprol.  HLD LDL 81.  Continue Repatha.  Diastolic dysfunction Prior echo showed EF 60 to 65%, mild LVH, grade 1 diastolic dysfunction, mild AI.  Patient has been taking Lasix since discharge.  He is euvolemic on exam.  I will stop Lasix and potassium. BMET as above.     Cardiac Rehabilitation Eligibility Assessment  The patient is ready to start cardiac rehabilitation pending clearance from the cardiac surgeon.       Dispo: Follow-up in 3 months  Signed, Magie Ciampa David Stall, PA-C

## 2023-06-23 NOTE — Patient Instructions (Signed)
 Medication Instructions:  Your physician recommends the following medication changes.  STOP TAKING: Lasix Potassium  *If you need a refill on your cardiac medications before your next appointment, please call your pharmacy*  Lab Work: Your provider would like for you to have following labs drawn today (CBC, BMP).    Testing/Procedures: No test ordered today   Follow-Up: At [facility], you and your health needs are our priority.  As part of our continuing mission to provide you with exceptional heart care, our providers are all part of one team.  This team includes your primary Cardiologist (physician) and Advanced Practice Providers or APPs (Physician Assistants and Nurse Practitioners) who all work together to provide you with the care you need, when you need it.  Your next appointment:   3 month(s)  Provider:   You may see Yvonne Kendall, MD or one of the following Advanced Practice Providers on your designated Care Team:   Nicolasa Ducking, NP Ames Dura, PA-C Eula Listen, PA-C Cadence Atlanta, PA-C Charlsie Quest, NP Carlos Levering, NP    We recommend signing up for the patient portal called "MyChart".  Sign up information is provided on this After Visit Summary.  MyChart is used to connect with patients for Virtual Visits (Telemedicine).  Patients are able to view lab/test results, encounter notes, upcoming appointments, etc.  Non-urgent messages can be sent to your provider as well.   To learn more about what you can do with MyChart, go to [email or web address].

## 2023-06-24 ENCOUNTER — Encounter: Payer: Self-pay | Admitting: Physician Assistant

## 2023-06-24 ENCOUNTER — Ambulatory Visit
Admission: RE | Admit: 2023-06-24 | Discharge: 2023-06-24 | Disposition: A | Discharge status: Home / Self Care | Source: Ambulatory Visit | Attending: Thoracic Surgery (Cardiothoracic Vascular Surgery) | Admitting: Thoracic Surgery (Cardiothoracic Vascular Surgery)

## 2023-06-24 ENCOUNTER — Ambulatory Visit (INDEPENDENT_AMBULATORY_CARE_PROVIDER_SITE_OTHER): Payer: Self-pay | Admitting: Physician Assistant

## 2023-06-24 ENCOUNTER — Other Ambulatory Visit: Payer: Self-pay | Admitting: Thoracic Surgery (Cardiothoracic Vascular Surgery)

## 2023-06-24 ENCOUNTER — Telehealth: Payer: Self-pay | Admitting: Internal Medicine

## 2023-06-24 VITALS — BP 143/87 | HR 80 | Resp 18 | Ht 64.0 in | Wt 181.0 lb

## 2023-06-24 DIAGNOSIS — Z951 Presence of aortocoronary bypass graft: Secondary | ICD-10-CM | POA: Diagnosis not present

## 2023-06-24 DIAGNOSIS — I2510 Atherosclerotic heart disease of native coronary artery without angina pectoris: Secondary | ICD-10-CM

## 2023-06-24 DIAGNOSIS — J90 Pleural effusion, not elsewhere classified: Secondary | ICD-10-CM

## 2023-06-24 LAB — BASIC METABOLIC PANEL WITH GFR
BUN/Creatinine Ratio: 11 (ref 10–24)
BUN: 13 mg/dL (ref 8–27)
CO2: 22 mmol/L (ref 20–29)
Calcium: 9.3 mg/dL (ref 8.6–10.2)
Chloride: 101 mmol/L (ref 96–106)
Creatinine, Ser: 1.17 mg/dL (ref 0.76–1.27)
Glucose: 161 mg/dL — ABNORMAL HIGH (ref 70–99)
Potassium: 4.4 mmol/L (ref 3.5–5.2)
Sodium: 137 mmol/L (ref 134–144)
eGFR: 67 mL/min/{1.73_m2} (ref >59)

## 2023-06-24 LAB — CBC
Hematocrit: 41.4 % (ref 37.5–51.0)
Hemoglobin: 13.1 g/dL (ref 13.0–17.7)
MCH: 29.2 pg (ref 26.6–33.0)
MCHC: 31.6 g/dL (ref 31.5–35.7)
MCV: 92 fL (ref 79–97)
Platelets: 320 10*3/uL (ref 150–450)
RBC: 4.49 x10E6/uL (ref 4.14–5.80)
RDW: 12.9 % (ref 11.6–15.4)
WBC: 6.9 10*3/uL (ref 3.4–10.8)

## 2023-06-24 NOTE — Telephone Encounter (Signed)
 Patient returned RN's call regarding results.

## 2023-06-25 NOTE — Telephone Encounter (Signed)
 This call was returned by Alisha at 5:04. See lab results note

## 2023-06-30 ENCOUNTER — Telehealth: Payer: Self-pay | Admitting: Internal Medicine

## 2023-06-30 NOTE — Telephone Encounter (Signed)
 Pt called in asking to have nurse go over his med list with him. Please advise.

## 2023-07-02 ENCOUNTER — Other Ambulatory Visit (HOSPITAL_COMMUNITY): Payer: Self-pay

## 2023-07-02 ENCOUNTER — Other Ambulatory Visit: Payer: Self-pay

## 2023-07-02 ENCOUNTER — Other Ambulatory Visit: Payer: Self-pay | Admitting: Thoracic Surgery (Cardiothoracic Vascular Surgery)

## 2023-07-02 ENCOUNTER — Ambulatory Visit
Admission: RE | Admit: 2023-07-02 | Discharge: 2023-07-02 | Disposition: A | Discharge status: Home / Self Care | Source: Ambulatory Visit | Attending: Thoracic Surgery (Cardiothoracic Vascular Surgery) | Admitting: Thoracic Surgery (Cardiothoracic Vascular Surgery)

## 2023-07-02 DIAGNOSIS — J90 Pleural effusion, not elsewhere classified: Secondary | ICD-10-CM | POA: Diagnosis not present

## 2023-07-02 DIAGNOSIS — Z951 Presence of aortocoronary bypass graft: Secondary | ICD-10-CM | POA: Diagnosis not present

## 2023-07-02 MED ORDER — CLOPIDOGREL BISULFATE 75 MG PO TABS: 75.0000 mg | ORAL_TABLET | Freq: Every day | ORAL | 2 refills | Status: DC

## 2023-07-02 NOTE — Telephone Encounter (Signed)
 Pt called and reports that he is running out of meds; pt received meds from [facility] on 3/10, verified pharm.  [facility] called to move these prescriptions over from [facility]

## 2023-07-02 NOTE — Progress Notes (Signed)
 Patient presented today for therapeutic thoracentesis s/p CABG with CXR 4/1 revealed left pleural effusion, ultrasound imaging was obtained and revealed small left sided pleural effusion which was amendable to percutaneous access, however patient stated he has no symptoms, he denies any shortness of breath, he denies any chest pain and has been walking well without difficulty. The patient stated if it is not a very large amount of fluid he prefers to hold off on the procedure and get follow-up CXR with his provider in a couple weeks to see if fluid resolves on its own. He is aware he may require thoracentesis in a few weeks if the fluid has not resolved, he is aware of the risks of not draining the fluid today such as infection, he wishes to not proceed today and will follow-up with his ordering provider for a follow-up CXR. Findings discussed with the patient today who verbalized understanding of the plan.   Electronically Signed: Berneta Levins, PA-C 07/02/2023, 10:56 AM

## 2023-07-04 ENCOUNTER — Other Ambulatory Visit: Payer: Self-pay | Admitting: Physician Assistant

## 2023-07-04 MED ORDER — FUROSEMIDE 40 MG PO TABS: 40.0000 mg | ORAL_TABLET | Freq: Every day | ORAL | 0 refills | Status: DC

## 2023-07-04 MED ORDER — POTASSIUM CHLORIDE CRYS ER 20 MEQ PO TBCR: 20.0000 meq | EXTENDED_RELEASE_TABLET | Freq: Every day | ORAL | 0 refills | Status: DC

## 2023-07-04 NOTE — Progress Notes (Signed)
      [address]       [place] [address]             [phone]      Patient presented to thoracentesis and ultrasound showed left pleural effusion was amenable to thoracentesis. Patient decided since he was told it was a small amount of fluid he preferred not to go through with thoracentesis. I will send in 1 week of Lasix 40mg  daily and potassium 20 mg daily. I will have the patient follow up in our office in 1-2 weeks with a CXR.   Jenny Reichmann, PA-C 07/04/23

## 2023-07-08 ENCOUNTER — Encounter (INDEPENDENT_AMBULATORY_CARE_PROVIDER_SITE_OTHER): Payer: Self-pay | Admitting: Vascular Surgery

## 2023-07-08 ENCOUNTER — Ambulatory Visit (INDEPENDENT_AMBULATORY_CARE_PROVIDER_SITE_OTHER): Payer: Medicare Other | Admitting: Vascular Surgery

## 2023-07-08 ENCOUNTER — Ambulatory Visit (INDEPENDENT_AMBULATORY_CARE_PROVIDER_SITE_OTHER): Payer: Medicare Other

## 2023-07-08 VITALS — BP 127/79 | HR 92 | Resp 18 | Ht 64.0 in | Wt 182.2 lb

## 2023-07-08 DIAGNOSIS — I701 Atherosclerosis of renal artery: Secondary | ICD-10-CM

## 2023-07-08 DIAGNOSIS — I10 Essential (primary) hypertension: Secondary | ICD-10-CM

## 2023-07-08 DIAGNOSIS — I25118 Atherosclerotic heart disease of native coronary artery with other forms of angina pectoris: Secondary | ICD-10-CM | POA: Diagnosis not present

## 2023-07-08 DIAGNOSIS — E785 Hyperlipidemia, unspecified: Secondary | ICD-10-CM

## 2023-07-08 NOTE — Assessment & Plan Note (Signed)
 His duplex shows a widely patent right renal artery status post intervention several years ago.  He has a congenitally absent left kidney.  Doing well from a renal vascular standpoint.  Blood pressure is good.  Follow-up in 1 year with duplex.

## 2023-07-08 NOTE — Assessment & Plan Note (Signed)
 Recent CABG and doing well.

## 2023-07-08 NOTE — Progress Notes (Signed)
 MRN : [id number]  Frederick Pace. is a 73 y.o. (January 23, 1951) male who presents with chief complaint of  Chief Complaint  Patient presents with   Follow-up    50yr. renal.  .  History of Present Illness: Patient returns today in follow up of his renal artery stenosis.  He is doing well today.  He underwent coronary artery bypass grafting about a month and a half ago and seems to be recovering well from that.  Current chest pain or shortness of breath.  His blood pressure control has been good in general.  It is excellent today.  His duplex shows a widely patent right renal artery status post intervention several years ago.  He has a congenitally absent left kidney.  Current Outpatient Medications  Medication Sig Dispense Refill   aspirin EC 81 MG tablet Take 1 tablet (81 mg total) by mouth daily. Swallow whole.     clopidogrel (PLAVIX) 75 MG tablet Take 1 tablet (75 mg total) by mouth daily. 30 tablet 2   Evolocumab (REPATHA SURECLICK) 140 MG/ML SOAJ Inject 140 mg into the skin every 14 (fourteen) days. 6 mL 3   furosemide (LASIX) 40 MG tablet Take 1 tablet (40 mg total) by mouth daily. 7 tablet 0   guaiFENesin (MUCINEX) 600 MG 12 hr tablet Take 1 tablet (600 mg total) by mouth 2 (two) times daily as needed.     losartan (COZAAR) 25 MG tablet Take 1 tablet (25 mg total) by mouth daily. 30 tablet 2   metoprolol succinate (TOPROL-XL) 100 MG 24 hr tablet Take 1 tablet (100 mg total) by mouth daily. Take with or immediately following a meal. 30 tablet 1   potassium chloride SA (KLOR-CON M) 20 MEQ tablet Take 1 tablet (20 mEq total) by mouth daily. 7 tablet 0   No current facility-administered medications for this visit.    Past Medical History:  Diagnosis Date   Aortic regurgitation    Congenital absence of one kidney    Coronary artery disease    HTN (hypertension)    Mitral regurgitation    Other and unspecified hyperlipidemia     Past Surgical History:  Procedure Laterality  Date   ANTERIOR CRUCIATE LIGAMENT REPAIR Left 2004   CORONARY ARTERY BYPASS GRAFT N/A 05/28/2023   Procedure: CORONARY ARTERY BYPASS GRAFTING TIMES FOUR USING LEFT INTERNAL MAMMARY ARTERY AND ENDOSCOPICALLY HARVESTED RIGHT GREAT SAPHENOUS VEIN; RIGHT CORONARY ENDARTERECTOMY;  Surgeon: Loreli Slot, MD;  Location: [facility] OR;  Service: Open Heart Surgery;  Laterality: N/A;   LEFT HEART CATH AND CORONARY ANGIOGRAPHY N/A 02/17/2023   Procedure: LEFT HEART CATH AND CORONARY ANGIOGRAPHY;  Surgeon: Yvonne Kendall, MD;  Location: [facility] INVASIVE CV LAB;  Service: Cardiovascular;  Laterality: N/A;   RENAL ANGIOGRAPHY Right 01/08/2018   Procedure: RENAL ANGIOGRAPHY;  Surgeon: Annice Needy, MD;  Location: [facility] INVASIVE CV LAB;  Service: Cardiovascular;  Laterality: Right;   TEE WITHOUT CARDIOVERSION N/A 05/28/2023   Procedure: ECHOCARDIOGRAM, TRANSESOPHAGEAL;  Surgeon: Loreli Slot, MD;  Location: [facility];  Service: Open Heart Surgery;  Laterality: N/A;     Social History   Tobacco Use   Smoking status: Never   Smokeless tobacco: Never  Vaping Use   Vaping status: Never Used  Substance Use Topics   Alcohol use: Yes    Comment: 1 drink and maybe 2 shots of bourbon every month   Drug use: No       Family History  Problem Relation Age  of Onset   Dementia Mother    Heart disease Father 14       CABG   Mitral valve prolapse Father    Aortic aneurysm Father    Heart disease Paternal Uncle        Valve replacements   Hyperlipidemia Other        family Hx     Allergies  Allergen Reactions   Crestor [Rosuvastatin] Rash    Diffuse pruritic rash    Statins Rash     REVIEW OF SYSTEMS (Negative unless checked)  Constitutional: [] Weight loss  [] Fever  [] Chills Cardiac: [] Chest pain   [] Chest pressure   [] Palpitations   [] Shortness of breath when laying flat   [] Shortness of breath at rest   [] Shortness of breath with exertion. Vascular:  [] Pain in legs with walking   [] Pain in legs  at rest   [] Pain in legs when laying flat   [] Claudication   [] Pain in feet when walking  [] Pain in feet at rest  [] Pain in feet when laying flat   [] History of DVT   [] Phlebitis   [] Swelling in legs   [] Varicose veins   [] Non-healing ulcers Pulmonary:   [] Uses home oxygen   [] Productive cough   [] Hemoptysis   [] Wheeze  [] COPD   [] Asthma Neurologic:  [] Dizziness  [] Blackouts   [] Seizures   [] History of stroke   [] History of TIA  [] Aphasia   [] Temporary blindness   [] Dysphagia   [] Weakness or numbness in arms   [] Weakness or numbness in legs Musculoskeletal:  [x] Arthritis   [] Joint swelling   [x] Joint pain   [] Low back pain Hematologic:  [] Easy bruising  [] Easy bleeding   [] Hypercoagulable state   [] Anemic   Gastrointestinal:  [] Blood in stool   [] Vomiting blood  [] Gastroesophageal reflux/heartburn   [] Abdominal pain Genitourinary:  [] Chronic kidney disease   [] Difficult urination  [] Frequent urination  [] Burning with urination   [] Hematuria Skin:  [] Rashes   [] Ulcers   [] Wounds Psychological:  [] History of anxiety   []  History of major depression.  Physical Examination  BP 127/79   Pulse 92   Resp 18   Ht 5\' 4"  (1.626 m)   Wt 182 lb 3.2 oz (82.6 kg)   BMI 31.27 kg/m  Gen:  WD/WN, NAD Head: [place]/AT, No temporalis wasting. Ear/Nose/Throat: Hearing grossly intact, nares w/o erythema or drainage Eyes: Conjunctiva clear. Sclera non-icteric Neck: Supple.  Trachea midline Pulmonary:  Good air movement, no use of accessory muscles.  Cardiac: RRR, no JVD Vascular:  Vessel Right Left  Radial Palpable Palpable           Musculoskeletal: M/S 5/5 throughout.  No deformity or atrophy. No edema. Neurologic: Sensation grossly intact in extremities.  Symmetrical.  Speech is fluent.  Psychiatric: Judgment intact, Mood & affect appropriate for pt's clinical situation. Dermatologic: No rashes or ulcers noted.  No cellulitis or open wounds.      Labs Recent Results (from the past 2160 hours)  CBC      Status: None   Collection Time: 05/27/23 10:19 AM  Result Value Ref Range   WBC 8.7 4.0 - 10.5 K/uL   RBC 4.91 4.22 - 5.81 MIL/uL   Hemoglobin 14.9 13.0 - 17.0 g/dL   HCT 09.8 11.9 - 14.7 %   MCV 91.0 80.0 - 100.0 fL   MCH 30.3 26.0 - 34.0 pg   MCHC 33.3 30.0 - 36.0 g/dL   RDW 82.9 56.2 - 13.0 %   Platelets 276 150 -  400 K/uL   nRBC 0.0 0.0 - 0.2 %    Comment: Performed at [facility], [address], [place], [place] [address]  Comprehensive metabolic panel     Status: Abnormal   Collection Time: 05/27/23 10:19 AM  Result Value Ref Range   Sodium 137 135 - 145 mmol/L   Potassium 4.6 3.5 - 5.1 mmol/L   Chloride 101 98 - 111 mmol/L   CO2 27 22 - 32 mmol/L   Glucose, Bld 101 (H) 70 - 99 mg/dL    Comment: Glucose reference range applies only to samples taken after fasting for at least 8 hours.   BUN 17 8 - 23 mg/dL   Creatinine, Ser 1.91 0.61 - 1.24 mg/dL   Calcium 9.7 8.9 - 47.8 mg/dL   Total Protein 7.3 6.5 - 8.1 g/dL   Albumin 4.2 3.5 - 5.0 g/dL   AST 23 15 - 41 U/L   ALT 30 0 - 44 U/L   Alkaline Phosphatase 75 38 - 126 U/L   Total Bilirubin 0.7 0.0 - 1.2 mg/dL   GFR, Estimated >29 >56 mL/min    Comment: (NOTE) Calculated using the CKD-EPI Creatinine Equation (2021)    Anion gap 9 5 - 15    Comment: Performed at [facility], [address], [place], [place] [address]  Protime-INR     Status: None   Collection Time: 05/27/23 10:19 AM  Result Value Ref Range   Prothrombin Time 14.5 11.4 - 15.2 seconds   INR 1.1 0.8 - 1.2    Comment: (NOTE) INR goal varies based on device and disease states. Performed at [facility], [address], [place], [place] [address]   APTT     Status: None   Collection Time: 05/27/23 10:19 AM  Result Value Ref Range   aPTT 30 24 - 36 seconds    Comment: Performed at [facility], [address], [place], [place] [address]  Hemoglobin A1c     Status: None   Collection Time: 05/27/23 10:19 AM  Result Value Ref  Range   Hgb A1c MFr Bld 5.4 4.8 - 5.6 %    Comment: (NOTE) Pre diabetes:          5.7%-6.4%  Diabetes:              >6.4%  Glycemic control for   <7.0% adults with diabetes    Mean Plasma Glucose 108.28 mg/dL    Comment: Performed at [facility], [address], [place], [place] [address]  Surgical pcr screen     Status: None   Collection Time: 05/27/23 10:20 AM   Specimen: Nasal Mucosa; Nasal Swab  Result Value Ref Range   MRSA, PCR NEGATIVE NEGATIVE   Staphylococcus aureus NEGATIVE NEGATIVE    Comment: (NOTE) The Xpert SA Assay (FDA approved for NASAL specimens in patients 71 years of age and older), is one component of a comprehensive surveillance program. It is not intended to diagnose infection nor to guide or monitor treatment. Performed at [facility], [address] [place], [place] [address]   Type and screen     Status: None   Collection Time: 05/27/23 10:45 AM  Result Value Ref Range   ABO/RH(D) A POS    Antibody Screen NEG    Sample Expiration 06/10/2023,2359    Extend sample reason      NO TRANSFUSIONS OR PREGNANCY IN THE PAST 3 MONTHS Performed at  [facility], [address] [place] [place], [place] [address]   Urinalysis, Routine w reflex microscopic -Urine, Clean Catch     Status: Abnormal   Collection Time: 05/27/23 11:09 AM  Result Value Ref Range   Color, Urine YELLOW YELLOW   APPearance HAZY (A) CLEAR   Specific Gravity, Urine 1.018 1.005 - 1.030   pH 5.0 5.0 - 8.0   Glucose, UA NEGATIVE NEGATIVE mg/dL   Hgb urine dipstick NEGATIVE NEGATIVE   Bilirubin Urine NEGATIVE NEGATIVE   Ketones, ur NEGATIVE NEGATIVE mg/dL   Protein, ur NEGATIVE NEGATIVE mg/dL   Nitrite NEGATIVE NEGATIVE   Leukocytes,Ua NEGATIVE NEGATIVE    Comment: Performed at [facility], [address] [place], [place] [address]  ABO/Rh     Status: None   Collection Time: 05/28/23  8:00 AM  Result Value Ref Range   ABO/RH(D)      A POS Performed at [facility], [address] [place], [place] [address]   I-STAT, Kristeen Peto 8     Status: Abnormal   Collection Time: 05/28/23  8:52 AM  Result Value Ref Range   Sodium 139 135 - 145 mmol/L   Potassium 3.9 3.5 - 5.1 mmol/L   Chloride 104 98 - 111 mmol/L   BUN 18 8 - 23 mg/dL   Creatinine, Ser 4.13 0.61 - 1.24 mg/dL   Glucose, Bld 244 (H) 70 - 99 mg/dL    Comment: Glucose reference range applies only to samples taken after fasting for at least 8 hours.   Calcium, Ion 1.22 1.15 - 1.40 mmol/L   TCO2 28 22 - 32 mmol/L   Hemoglobin 12.9 (L) 13.0 - 17.0 g/dL   HCT 01.0 (L) 27.2 - 53.6 %  I-STAT 7, (LYTES, BLD GAS, ICA, H+H)     Status: Abnormal   Collection Time: 05/28/23  8:55 AM  Result Value Ref Range   pH, Arterial 7.344 (L) 7.35 - 7.45   pCO2 arterial 45.3 32 - 48 mmHg   pO2, Arterial 479 (H) 83 - 108 mmHg   Bicarbonate 24.7 20.0 - 28.0 mmol/L   TCO2 26 22 - 32 mmol/L   O2 Saturation 100 %   Acid-base deficit 1.0 0.0 - 2.0 mmol/L   Sodium 139 135 - 145 mmol/L   Potassium 3.9 3.5 - 5.1 mmol/L   Calcium, Ion 1.20 1.15 - 1.40 mmol/L   HCT 39.0 39.0 - 52.0 %   Hemoglobin 13.3 13.0 - 17.0 g/dL   Sample type ARTERIAL   ECHO INTRAOPERATIVE TEE     Status: None   Collection Time: 05/28/23  9:00 AM  Result Value Ref Range   Weight 3,047.99 oz   Height 64 in   BP 126/75 mmHg  I-STAT, chem 8     Status: Abnormal   Collection Time: 05/28/23 11:00 AM  Result Value Ref Range   Sodium 138 135 - 145 mmol/L   Potassium 4.0 3.5 - 5.1 mmol/L   Chloride 104 98 - 111 mmol/L   BUN 17 8 - 23 mg/dL   Creatinine, Ser 6.44 0.61 - 1.24 mg/dL   Glucose, Bld 034 (H) 70 - 99 mg/dL    Comment: Glucose reference range applies only to samples taken after fasting for at least 8 hours.   Calcium, Ion 1.21 1.15 - 1.40 mmol/L   TCO2 26 22 - 32 mmol/L   Hemoglobin 12.2 (L) 13.0 - 17.0 g/dL   HCT 74.2 (L) 59.5 - 63.8 %  I-STAT 7, (LYTES, BLD GAS, ICA, H+H)     Status: Abnormal   Collection Time: 05/28/23  11:11 AM  Result Value Ref Range   pH, Arterial 7.365 7.35 - 7.45   pCO2 arterial 43.0 32 - 48 mmHg   pO2, Arterial 373 (H) 83 - 108 mmHg   Bicarbonate 24.6 20.0 - 28.0 mmol/L   TCO2 26 22 - 32 mmol/L   O2 Saturation 100 %   Acid-base deficit 1.0 0.0 - 2.0 mmol/L   Sodium 140 135 - 145 mmol/L   Potassium 4.1 3.5 - 5.1 mmol/L   Calcium, Ion 0.95 (L) 1.15 - 1.40 mmol/L   HCT 27.0 (L) 39.0 - 52.0 %   Hemoglobin 9.2 (L) 13.0 - 17.0 g/dL   Sample type ARTERIAL   POCT I-Stat EG7     Status: Abnormal   Collection Time: 05/28/23 11:15 AM  Result Value Ref Range   pH, Ven 7.331 7.25 - 7.43   pCO2, Ven 49.0 44 - 60 mmHg   pO2, Ven 44 32 - 45 mmHg   Bicarbonate 25.9 20.0 - 28.0 mmol/L   TCO2 27 22 - 32 mmol/L   O2 Saturation 76 %   Acid-Base Excess 0.0 0.0 - 2.0 mmol/L   Sodium 140 135 - 145 mmol/L   Potassium 3.8 3.5 - 5.1 mmol/L   Calcium, Ion 1.05 (L) 1.15 - 1.40 mmol/L   HCT 29.0 (L) 39.0 - 52.0 %   Hemoglobin 9.9 (L) 13.0 - 17.0 g/dL   Sample type VENOUS   I-STAT, chem 8     Status: Abnormal   Collection Time: 05/28/23 11:48 AM  Result Value Ref Range   Sodium 136 135 - 145 mmol/L   Potassium 5.2 (H) 3.5 - 5.1 mmol/L   Chloride 102 98 - 111 mmol/L   BUN 16 8 - 23 mg/dL   Creatinine, Ser 8.11 0.61 - 1.24 mg/dL   Glucose, Bld 914 (H) 70 - 99 mg/dL    Comment: Glucose reference range applies only to samples taken after fasting for at least 8 hours.   Calcium, Ion 0.97 (L) 1.15 - 1.40 mmol/L   TCO2 26 22 - 32 mmol/L   Hemoglobin 7.8 (L) 13.0 - 17.0 g/dL   HCT 78.2 (L) 95.6 - 21.3 %  Surgical pathology     Status: None   Collection Time: 05/28/23 12:15 PM  Result Value Ref Range   SURGICAL PATHOLOGY      SURGICAL PATHOLOGY CASE: [phone] PATIENT: Stuart Ellis Surgical Pathology Report     Clinical History: coronary artery disease, LMD (cm)     FINAL MICROSCOPIC DIAGNOSIS:  A. PLAQUE, RIGHT CORONARY: Calcified atherosclerotic plaque.   GROSS  DESCRIPTION:  Received fresh is a 5.3 cm in length and up to 0.3 cm in diameter yellow-pink cylindrical tissue/material with scattered calcification. Cut surfaces have areas with a pinpoint lumen. Representative sections are submitted in 1 block following decalcification.  SW 05/28/2023  Final Diagnosis performed by Ramey Ellsworth, MD.   Electronically signed 05/29/2023 Technical and / or Professional components performed at [facility]. [facility], [address], [place], [place] [address].  Immunohistochemistry Technical component (if applicable) was performed at [facility]. [address], [place], [place] [address].   IMMUNOHISTOCHEMISTRY DISCLAIMER (if applicable): Some o f these immunohistochemical stains may have been developed and the performance characteristics determine by [facility]. Some may not have been cleared or approved by the U.S. Food and Drug Administration. The  FDA has determined that such clearance or approval is not necessary. This test is used for clinical purposes. It should not be regarded as investigational or for research. This laboratory is certified under the Clinical Laboratory Improvement Amendments of 1988 (CLIA-88) as qualified to perform high complexity clinical laboratory testing.  The controls stained appropriately.   IHC stains are performed on formalin fixed, paraffin embedded tissue using a 3,3"diaminobenzidine (DAB) chromogen and Leica Bond Autostainer System. The staining intensity of the nucleus is score manually and is reported as the percentage of tumor cell nuclei demonstrating specific nuclear staining. The specimens are fixed in 10% Neutral Formalin for at least 6 hours and up to 72h rs. These tests are validated on decalcified tissue. Results should be interpreted with caution given the possibility of false negative results on decalcified specimens. Antibody Clones are as follows ER-clone  76F, PR-clone 16, Ki67- clone MM1. Some of these immunohistochemical stains may have been developed and the performance characteristics determined by [facility].   I-STAT 7, (LYTES, BLD GAS, ICA, H+H)     Status: Abnormal   Collection Time: 05/28/23 12:19 PM  Result Value Ref Range   pH, Arterial 7.423 7.35 - 7.45   pCO2 arterial 37.3 32 - 48 mmHg   pO2, Arterial 372 (H) 83 - 108 mmHg   Bicarbonate 24.4 20.0 - 28.0 mmol/L   TCO2 26 22 - 32 mmol/L   O2 Saturation 100 %   Acid-Base Excess 0.0 0.0 - 2.0 mmol/L   Sodium 137 135 - 145 mmol/L   Potassium 5.0 3.5 - 5.1 mmol/L   Calcium, Ion 1.01 (L) 1.15 - 1.40 mmol/L   HCT 26.0 (L) 39.0 - 52.0 %   Hemoglobin 8.8 (L) 13.0 - 17.0 g/dL   Sample type ARTERIAL   Hemoglobin and hematocrit, blood     Status: Abnormal   Collection Time: 05/28/23 12:30 PM  Result Value Ref Range   Hemoglobin 9.4 (L) 13.0 - 17.0 g/dL    Comment: REPEATED TO VERIFY RESULT CALLED TO, READ BACK BY AND VERIFIED WITH: Vergia Glasgow RN  05/28/2023 1252 BNUNNERY    HCT 27.7 (L) 39.0 - 52.0 %    Comment: Performed at [facility], [address], [place], [place] [address]  Platelet count     Status: None   Collection Time: 05/28/23 12:30 PM  Result Value Ref Range   Platelets 174 150 - 400 K/uL    Comment: Performed at [facility], [address], [place], [place] [address]  I-STAT, Kristeen Peto 8     Status: Abnormal   Collection Time: 05/28/23 12:46 PM  Result Value Ref Range   Sodium 136 135 - 145 mmol/L   Potassium 5.1 3.5 - 5.1 mmol/L   Chloride 105 98 - 111 mmol/L   BUN 16 8 - 23 mg/dL   Creatinine, Ser 0.98 0.61 - 1.24 mg/dL   Glucose, Bld 119 (H) 70 - 99 mg/dL    Comment: Glucose reference range applies only to samples taken after fasting for at least 8 hours.   Calcium, Ion 1.03 (L) 1.15 - 1.40 mmol/L   TCO2 27 22 - 32 mmol/L   Hemoglobin 8.8 (L) 13.0 - 17.0 g/dL   HCT 14.7 (L) 82.9 - 56.2 %  I-STAT, chem 8     Status: Abnormal   Collection  Time: 05/28/23  1:33 PM  Result Value Ref Range   Sodium 138 135 - 145 mmol/L   Potassium 4.3 3.5 -  5.1 mmol/L   Chloride 105 98 - 111 mmol/L   BUN 15 8 - 23 mg/dL   Creatinine, Ser 0.98 0.61 - 1.24 mg/dL   Glucose, Bld 119 (H) 70 - 99 mg/dL    Comment: Glucose reference range applies only to samples taken after fasting for at least 8 hours.   Calcium, Ion 1.14 (L) 1.15 - 1.40 mmol/L   TCO2 24 22 - 32 mmol/L   Hemoglobin 8.5 (L) 13.0 - 17.0 g/dL   HCT 14.7 (L) 82.9 - 56.2 %  I-STAT 7, (LYTES, BLD GAS, ICA, H+H)     Status: Abnormal   Collection Time: 05/28/23  1:36 PM  Result Value Ref Range   pH, Arterial 7.308 (L) 7.35 - 7.45   pCO2 arterial 46.4 32 - 48 mmHg   pO2, Arterial 262 (H) 83 - 108 mmHg   Bicarbonate 23.3 20.0 - 28.0 mmol/L   TCO2 25 22 - 32 mmol/L   O2 Saturation 100 %   Acid-base deficit 3.0 (H) 0.0 - 2.0 mmol/L   Sodium 139 135 - 145 mmol/L   Potassium 4.3 3.5 - 5.1 mmol/L   Calcium, Ion 1.14 (L) 1.15 - 1.40 mmol/L   HCT 27.0 (L) 39.0 - 52.0 %   Hemoglobin 9.2 (L) 13.0 - 17.0 g/dL   Sample type ARTERIAL   Glucose, capillary     Status: Abnormal   Collection Time: 05/28/23  2:37 PM  Result Value Ref Range   Glucose-Capillary 104 (H) 70 - 99 mg/dL    Comment: Glucose reference range applies only to samples taken after fasting for at least 8 hours.  I-STAT 7, (LYTES, BLD GAS, ICA, H+H)     Status: Abnormal   Collection Time: 05/28/23  2:40 PM  Result Value Ref Range   pH, Arterial 7.328 (L) 7.35 - 7.45   pCO2 arterial 43.5 32 - 48 mmHg   pO2, Arterial 98 83 - 108 mmHg   Bicarbonate 23.1 20.0 - 28.0 mmol/L   TCO2 24 22 - 32 mmol/L   O2 Saturation 97 %   Acid-base deficit 3.0 (H) 0.0 - 2.0 mmol/L   Sodium 139 135 - 145 mmol/L   Potassium 4.1 3.5 - 5.1 mmol/L   Calcium, Ion 1.09 (L) 1.15 - 1.40 mmol/L   HCT 27.0 (L) 39.0 - 52.0 %   Hemoglobin 9.2 (L) 13.0 - 17.0 g/dL   Patient temperature 13.0 C    Sample type ARTERIAL   CBC     Status: Abnormal    Collection Time: 05/28/23  2:43 PM  Result Value Ref Range   WBC 22.6 (H) 4.0 - 10.5 K/uL   RBC 3.27 (L) 4.22 - 5.81 MIL/uL   Hemoglobin 10.1 (L) 13.0 - 17.0 g/dL   HCT 86.5 (L) 78.4 - 69.6 %   MCV 92.0 80.0 - 100.0 fL   MCH 30.9 26.0 - 34.0 pg   MCHC 33.6 30.0 - 36.0 g/dL   RDW 29.5 28.4 - 13.2 %   Platelets 161 150 - 400 K/uL   nRBC 0.0 0.0 - 0.2 %    Comment: Performed at [facility], [address] [place], [place] [address]  Protime-INR     Status: Abnormal   Collection Time: 05/28/23  2:43 PM  Result Value Ref Range   Prothrombin Time 19.3 (H) 11.4 - 15.2 seconds   INR 1.6 (H) 0.8 - 1.2    Comment: (NOTE) INR goal varies based on device and disease states. Performed at  [facility], [address] [place], [place], [place] [address]   APTT     Status: None   Collection Time: 05/28/23  2:43 PM  Result Value Ref Range   aPTT 30 24 - 36 seconds    Comment: Performed at [facility], [address], [place], [place] [address]  Glucose, capillary     Status: Abnormal   Collection Time: 05/28/23  3:52 PM  Result Value Ref Range   Glucose-Capillary 146 (H) 70 - 99 mg/dL    Comment: Glucose reference range applies only to samples taken after fasting for at least 8 hours.  POCT I-Stat EG7     Status: Abnormal   Collection Time: 05/28/23  3:54 PM  Result Value Ref Range   pH, Ven 7.391 7.25 - 7.43   pCO2, Ven 37.6 (L) 44 - 60 mmHg   pO2, Ven 106 (H) 32 - 45 mmHg   Bicarbonate 23.1 20.0 - 28.0 mmol/L   TCO2 24 22 - 32 mmol/L   O2 Saturation 98 %   Acid-base deficit 2.0 0.0 - 2.0 mmol/L   Sodium 140 135 - 145 mmol/L   Potassium 4.5 3.5 - 5.1 mmol/L   Calcium, Ion 1.04 (L) 1.15 - 1.40 mmol/L   HCT 25.0 (L) 39.0 - 52.0 %   Hemoglobin 8.5 (L) 13.0 - 17.0 g/dL   Patient temperature 09.8 C    Sample type VENOUS   Glucose, capillary     Status: Abnormal   Collection Time: 05/28/23  4:55 PM  Result Value Ref Range   Glucose-Capillary 130 (H) 70 - 99 mg/dL     Comment: Glucose reference range applies only to samples taken after fasting for at least 8 hours.  I-STAT 7, (LYTES, BLD GAS, ICA, H+H)     Status: Abnormal   Collection Time: 05/28/23  4:58 PM  Result Value Ref Range   pH, Arterial 7.333 (L) 7.35 - 7.45   pCO2 arterial 42.8 32 - 48 mmHg   pO2, Arterial 96 83 - 108 mmHg   Bicarbonate 22.9 20.0 - 28.0 mmol/L   TCO2 24 22 - 32 mmol/L   O2 Saturation 97 %   Acid-base deficit 3.0 (H) 0.0 - 2.0 mmol/L   Sodium 141 135 - 145 mmol/L   Potassium 4.0 3.5 - 5.1 mmol/L   Calcium, Ion 1.08 (L) 1.15 - 1.40 mmol/L   HCT 24.0 (L) 39.0 - 52.0 %   Hemoglobin 8.2 (L) 13.0 - 17.0 g/dL   Patient temperature 11.9 C    Sample type ARTERIAL   Glucose, capillary     Status: None   Collection Time: 05/28/23  6:02 PM  Result Value Ref Range   Glucose-Capillary 78 70 - 99 mg/dL    Comment: Glucose reference range applies only to samples taken after fasting for at least 8 hours.  Glucose, capillary     Status: Abnormal   Collection Time: 05/28/23  7:05 PM  Result Value Ref Range   Glucose-Capillary 109 (H) 70 - 99 mg/dL    Comment: Glucose reference range applies only to samples taken after fasting for at least 8 hours.  Glucose, capillary     Status: Abnormal   Collection Time: 05/28/23  8:14 PM  Result Value Ref Range   Glucose-Capillary 128 (H) 70 - 99 mg/dL    Comment: Glucose reference range applies only to samples taken after fasting for at least 8 hours.  CBC     Status: Abnormal  Collection Time: 05/28/23  8:30 PM  Result Value Ref Range   WBC 12.8 (H) 4.0 - 10.5 K/uL   RBC 2.80 (L) 4.22 - 5.81 MIL/uL   Hemoglobin 8.6 (L) 13.0 - 17.0 g/dL   HCT 16.1 (L) 09.6 - 04.5 %   MCV 91.8 80.0 - 100.0 fL   MCH 30.7 26.0 - 34.0 pg   MCHC 33.5 30.0 - 36.0 g/dL   RDW 40.9 81.1 - 91.4 %   Platelets 143 (L) 150 - 400 K/uL   nRBC 0.0 0.0 - 0.2 %    Comment: Performed at [facility], [address] [place], [place] [address]  Magnesium     Status:  Abnormal   Collection Time: 05/28/23  8:30 PM  Result Value Ref Range   Magnesium 3.0 (H) 1.7 - 2.4 mg/dL    Comment: Performed at [facility], [address], [place], [place] [address]  Basic metabolic panel     Status: Abnormal   Collection Time: 05/28/23  8:30 PM  Result Value Ref Range   Sodium 139 135 - 145 mmol/L   Potassium 4.3 3.5 - 5.1 mmol/L   Chloride 108 98 - 111 mmol/L   CO2 19 (L) 22 - 32 mmol/L   Glucose, Bld 130 (H) 70 - 99 mg/dL    Comment: Glucose reference range applies only to samples taken after fasting for at least 8 hours.   BUN 15 8 - 23 mg/dL   Creatinine, Ser 8.65 0.61 - 1.24 mg/dL   Calcium 7.4 (L) 8.9 - 10.3 mg/dL    Comment: DELTA CHECK NOTED   GFR, Estimated >60 >60 mL/min    Comment: (NOTE) Calculated using the CKD-EPI Creatinine Equation (2021)    Anion gap 12 5 - 15    Comment: Performed at [facility], [address], [place], [place] [address]  Glucose, capillary     Status: Abnormal   Collection Time: 05/28/23  9:12 PM  Result Value Ref Range   Glucose-Capillary 122 (H) 70 - 99 mg/dL    Comment: Glucose reference range applies only to samples taken after fasting for at least 8 hours.  Glucose, capillary     Status: Abnormal   Collection Time: 05/28/23 10:08 PM  Result Value Ref Range   Glucose-Capillary 138 (H) 70 - 99 mg/dL    Comment: Glucose reference range applies only to samples taken after fasting for at least 8 hours.  Glucose, capillary     Status: Abnormal   Collection Time: 05/29/23 12:01 AM  Result Value Ref Range   Glucose-Capillary 127 (H) 70 - 99 mg/dL    Comment: Glucose reference range applies only to samples taken after fasting for at least 8 hours.  Glucose, capillary     Status: None   Collection Time: 05/29/23  2:01 AM  Result Value Ref Range   Glucose-Capillary 92 70 - 99 mg/dL    Comment: Glucose reference range applies only to samples taken after fasting for at least 8 hours.  Glucose, capillary      Status: Abnormal   Collection Time: 05/29/23  3:04 AM  Result Value Ref Range   Glucose-Capillary 114 (H) 70 - 99 mg/dL    Comment: Glucose reference range applies only to samples taken after fasting for at least 8 hours.  Glucose, capillary     Status: Abnormal   Collection Time: 05/29/23  4:02 AM  Result Value Ref Range   Glucose-Capillary 105 (H) 70 -  99 mg/dL    Comment: Glucose reference range applies only to samples taken after fasting for at least 8 hours.  Glucose, capillary     Status: Abnormal   Collection Time: 05/29/23  5:05 AM  Result Value Ref Range   Glucose-Capillary 114 (H) 70 - 99 mg/dL    Comment: Glucose reference range applies only to samples taken after fasting for at least 8 hours.  CBC     Status: Abnormal   Collection Time: 05/29/23  5:09 AM  Result Value Ref Range   WBC 12.5 (H) 4.0 - 10.5 K/uL   RBC 2.56 (L) 4.22 - 5.81 MIL/uL   Hemoglobin 7.8 (L) 13.0 - 17.0 g/dL   HCT 16.1 (L) 09.6 - 04.5 %   MCV 92.2 80.0 - 100.0 fL   MCH 30.5 26.0 - 34.0 pg   MCHC 33.1 30.0 - 36.0 g/dL   RDW 40.9 81.1 - 91.4 %   Platelets 149 (L) 150 - 400 K/uL   nRBC 0.0 0.0 - 0.2 %    Comment: Performed at [facility], [address], [place], [place] [address]  Basic metabolic panel     Status: Abnormal   Collection Time: 05/29/23  5:09 AM  Result Value Ref Range   Sodium 135 135 - 145 mmol/L   Potassium 3.7 3.5 - 5.1 mmol/L   Chloride 111 98 - 111 mmol/L   CO2 19 (L) 22 - 32 mmol/L   Glucose, Bld 112 (H) 70 - 99 mg/dL    Comment: Glucose reference range applies only to samples taken after fasting for at least 8 hours.   BUN 14 8 - 23 mg/dL   Creatinine, Ser 6.21 0.61 - 1.24 mg/dL   Calcium 6.9 (L) 8.9 - 10.3 mg/dL   GFR, Estimated >30 >86 mL/min    Comment: (NOTE) Calculated using the CKD-EPI Creatinine Equation (2021)    Anion gap 5 5 - 15    Comment: Performed at [facility], [address] [place], [place] [address]  Magnesium     Status: None    Collection Time: 05/29/23  5:09 AM  Result Value Ref Range   Magnesium 2.3 1.7 - 2.4 mg/dL    Comment: Performed at [facility], [address], [place], [place] [address]  Glucose, capillary     Status: Abnormal   Collection Time: 05/29/23  8:06 AM  Result Value Ref Range   Glucose-Capillary 123 (H) 70 - 99 mg/dL    Comment: Glucose reference range applies only to samples taken after fasting for at least 8 hours.  Glucose, capillary     Status: Abnormal   Collection Time: 05/29/23 11:28 AM  Result Value Ref Range   Glucose-Capillary 113 (H) 70 - 99 mg/dL    Comment: Glucose reference range applies only to samples taken after fasting for at least 8 hours.  Basic metabolic panel     Status: Abnormal   Collection Time: 05/29/23  3:37 PM  Result Value Ref Range   Sodium 135 135 - 145 mmol/L   Potassium 4.1 3.5 - 5.1 mmol/L   Chloride 110 98 - 111 mmol/L   CO2 21 (L) 22 - 32 mmol/L   Glucose, Bld 134 (H) 70 - 99 mg/dL    Comment: Glucose reference range applies only to samples taken after fasting for at least 8 hours.   BUN 17 8 - 23 mg/dL   Creatinine, Ser 2.84 0.61 - 1.24 mg/dL  Calcium 8.1 (L) 8.9 - 10.3 mg/dL   GFR, Estimated >40 >98 mL/min    Comment: (NOTE) Calculated using the CKD-EPI Creatinine Equation (2021)    Anion gap 4 (L) 5 - 15    Comment: Performed at [facility], [address] [place], [place] [address]  Magnesium     Status: None   Collection Time: 05/29/23  3:37 PM  Result Value Ref Range   Magnesium 2.2 1.7 - 2.4 mg/dL    Comment: Performed at [facility], [address] [place], [place] [address]  CBC     Status: Abnormal   Collection Time: 05/29/23  3:37 PM  Result Value Ref Range   WBC 10.5 4.0 - 10.5 K/uL   RBC 2.29 (L) 4.22 - 5.81 MIL/uL   Hemoglobin 7.1 (L) 13.0 - 17.0 g/dL   HCT 62.1 (L) 30.8 - 65.7 %   MCV 95.2 80.0 - 100.0 fL   MCH 31.0 26.0 - 34.0 pg   MCHC 32.6 30.0 - 36.0 g/dL   RDW 84.6 96.2 - 95.2 %   Platelets  115 (L) 150 - 400 K/uL   nRBC 0.0 0.0 - 0.2 %    Comment: Performed at [facility], [address] [place], [place] [address]  Glucose, capillary     Status: Abnormal   Collection Time: 05/29/23  3:57 PM  Result Value Ref Range   Glucose-Capillary 118 (H) 70 - 99 mg/dL    Comment: Glucose reference range applies only to samples taken after fasting for at least 8 hours.  Glucose, capillary     Status: Abnormal   Collection Time: 05/29/23  8:06 PM  Result Value Ref Range   Glucose-Capillary 108 (H) 70 - 99 mg/dL    Comment: Glucose reference range applies only to samples taken after fasting for at least 8 hours.  Glucose, capillary     Status: Abnormal   Collection Time: 05/30/23 12:13 AM  Result Value Ref Range   Glucose-Capillary 112 (H) 70 - 99 mg/dL    Comment: Glucose reference range applies only to samples taken after fasting for at least 8 hours.  Glucose, capillary     Status: Abnormal   Collection Time: 05/30/23  4:02 AM  Result Value Ref Range   Glucose-Capillary 120 (H) 70 - 99 mg/dL    Comment: Glucose reference range applies only to samples taken after fasting for at least 8 hours.  Basic metabolic panel     Status: Abnormal   Collection Time: 05/30/23  4:55 AM  Result Value Ref Range   Sodium 136 135 - 145 mmol/L   Potassium 3.9 3.5 - 5.1 mmol/L   Chloride 108 98 - 111 mmol/L   CO2 21 (L) 22 - 32 mmol/L   Glucose, Bld 109 (H) 70 - 99 mg/dL    Comment: Glucose reference range applies only to samples taken after fasting for at least 8 hours.   BUN 18 8 - 23 mg/dL   Creatinine, Ser 4.40 0.61 - 1.24 mg/dL   Calcium 7.8 (L) 8.9 - 10.3 mg/dL   GFR, Estimated >10 >27 mL/min    Comment: (NOTE) Calculated using the CKD-EPI Creatinine Equation (2021)    Anion gap 7 5 - 15    Comment: Performed at [facility], [address] [place], [place] [address]  CBC     Status: Abnormal   Collection Time: 05/30/23  4:55 AM  Result Value Ref Range  WBC 10.3 4.0 -  10.5 K/uL   RBC 2.27 (L) 4.22 - 5.81 MIL/uL   Hemoglobin 7.0 (L) 13.0 - 17.0 g/dL   HCT 40.9 (L) 81.1 - 91.4 %   MCV 93.8 80.0 - 100.0 fL   MCH 30.8 26.0 - 34.0 pg   MCHC 32.9 30.0 - 36.0 g/dL   RDW 78.2 95.6 - 21.3 %   Platelets 102 (L) 150 - 400 K/uL    Comment: REPEATED TO VERIFY   nRBC 0.0 0.0 - 0.2 %    Comment: Performed at [facility], [address], [place], [place] [address]  Glucose, capillary     Status: Abnormal   Collection Time: 05/30/23  8:16 AM  Result Value Ref Range   Glucose-Capillary 159 (H) 70 - 99 mg/dL    Comment: Glucose reference range applies only to samples taken after fasting for at least 8 hours.  Glucose, capillary     Status: Abnormal   Collection Time: 05/30/23 11:33 AM  Result Value Ref Range   Glucose-Capillary 125 (H) 70 - 99 mg/dL    Comment: Glucose reference range applies only to samples taken after fasting for at least 8 hours.  Glucose, capillary     Status: Abnormal   Collection Time: 05/30/23  4:06 PM  Result Value Ref Range   Glucose-Capillary 132 (H) 70 - 99 mg/dL    Comment: Glucose reference range applies only to samples taken after fasting for at least 8 hours.  Glucose, capillary     Status: Abnormal   Collection Time: 05/30/23  9:00 PM  Result Value Ref Range   Glucose-Capillary 123 (H) 70 - 99 mg/dL    Comment: Glucose reference range applies only to samples taken after fasting for at least 8 hours.  CBC     Status: Abnormal   Collection Time: 05/31/23  2:59 AM  Result Value Ref Range   WBC 10.8 (H) 4.0 - 10.5 K/uL   RBC 2.37 (L) 4.22 - 5.81 MIL/uL   Hemoglobin 7.2 (L) 13.0 - 17.0 g/dL   HCT 84.6 (L) 96.2 - 95.2 %   MCV 91.1 80.0 - 100.0 fL   MCH 30.4 26.0 - 34.0 pg   MCHC 33.3 30.0 - 36.0 g/dL   RDW 84.1 32.4 - 40.1 %   Platelets 126 (L) 150 - 400 K/uL   nRBC 0.0 0.0 - 0.2 %    Comment: Performed at [facility], [address] [place], [place] [address]  Basic metabolic panel     Status: Abnormal    Collection Time: 05/31/23  2:59 AM  Result Value Ref Range   Sodium 136 135 - 145 mmol/L   Potassium 3.5 3.5 - 5.1 mmol/L   Chloride 103 98 - 111 mmol/L   CO2 24 22 - 32 mmol/L   Glucose, Bld 115 (H) 70 - 99 mg/dL    Comment: Glucose reference range applies only to samples taken after fasting for at least 8 hours.   BUN 17 8 - 23 mg/dL   Creatinine, Ser 3.66 0.61 - 1.24 mg/dL   Calcium 8.0 (L) 8.9 - 10.3 mg/dL   GFR, Estimated >44 >03 mL/min    Comment: (NOTE) Calculated using the CKD-EPI Creatinine Equation (2021)    Anion gap 9 5 - 15    Comment: Performed at [facility], [address] [place], [place] [address]  Glucose, capillary     Status: Abnormal   Collection Time: 05/31/23  6:09 AM  Result Value Ref Range   Glucose-Capillary 105 (H) 70 - 99 mg/dL    Comment: Glucose reference range applies only to samples taken after fasting for at least 8 hours.  Glucose, capillary     Status: Abnormal   Collection Time: 05/31/23 11:05 AM  Result Value Ref Range   Glucose-Capillary 151 (H) 70 - 99 mg/dL    Comment: Glucose reference range applies only to samples taken after fasting for at least 8 hours.  Magnesium     Status: None   Collection Time: 06/01/23  5:02 AM  Result Value Ref Range   Magnesium 1.7 1.7 - 2.4 mg/dL    Comment: Performed at [facility], [address] [place], [place] [address]  Basic metabolic panel     Status: Abnormal   Collection Time: 06/01/23  5:02 AM  Result Value Ref Range   Sodium 139 135 - 145 mmol/L   Potassium 3.3 (L) 3.5 - 5.1 mmol/L   Chloride 103 98 - 111 mmol/L   CO2 24 22 - 32 mmol/L   Glucose, Bld 118 (H) 70 - 99 mg/dL    Comment: Glucose reference range applies only to samples taken after fasting for at least 8 hours.   BUN 17 8 - 23 mg/dL   Creatinine, Ser 1.32 0.61 - 1.24 mg/dL   Calcium 8.1 (L) 8.9 - 10.3 mg/dL   GFR, Estimated >44 >01 mL/min    Comment: (NOTE) Calculated using the CKD-EPI Creatinine Equation  (2021)    Anion gap 12 5 - 15    Comment: Performed at [facility], [address], [place], [place] [address]  CBC     Status: Abnormal   Collection Time: 06/01/23  5:02 AM  Result Value Ref Range   WBC 10.8 (H) 4.0 - 10.5 K/uL   RBC 2.56 (L) 4.22 - 5.81 MIL/uL   Hemoglobin 7.9 (L) 13.0 - 17.0 g/dL   HCT 36.6 (L) 44.0 - 34.7 %   MCV 92.2 80.0 - 100.0 fL   MCH 30.9 26.0 - 34.0 pg   MCHC 33.5 30.0 - 36.0 g/dL   RDW 42.5 95.6 - 38.7 %   Platelets 159 150 - 400 K/uL   nRBC 0.2 0.0 - 0.2 %    Comment: Performed at [facility], [address] [place], [place] [address]  Basic metabolic panel     Status: Abnormal   Collection Time: 06/02/23  4:54 AM  Result Value Ref Range   Sodium 139 135 - 145 mmol/L   Potassium 3.9 3.5 - 5.1 mmol/L   Chloride 105 98 - 111 mmol/L   CO2 24 22 - 32 mmol/L   Glucose, Bld 110 (H) 70 - 99 mg/dL    Comment: Glucose reference range applies only to samples taken after fasting for at least 8 hours.   BUN 22 8 - 23 mg/dL   Creatinine, Ser 2.95 0.61 - 1.24 mg/dL   Calcium 8.2 (L) 8.9 - 10.3 mg/dL   GFR, Estimated >18 >84 mL/min    Comment: (NOTE) Calculated using the CKD-EPI Creatinine Equation (2021)    Anion gap 10 5 - 15    Comment: Performed at [facility], [address] [place], [place] [address]  CBC     Status: None   Collection Time: 06/23/23  8:34 AM  Result Value Ref Range   WBC 6.9 3.4 - 10.8 x10E3/uL   RBC 4.49 4.14 - 5.80  x10E6/uL   Hemoglobin 13.1 13.0 - 17.7 g/dL   Hematocrit 16.1 09.6 - 51.0 %   MCV 92 79 - 97 fL   MCH 29.2 26.6 - 33.0 pg   MCHC 31.6 31.5 - 35.7 g/dL   RDW 04.5 40.9 - 81.1 %   Platelets 320 150 - 450 x10E3/uL  Basic metabolic panel with GFR     Status: Abnormal   Collection Time: 06/23/23  8:34 AM  Result Value Ref Range   Glucose 161 (H) 70 - 99 mg/dL   BUN 13 8 - 27 mg/dL   Creatinine, Ser 9.14 0.76 - 1.27 mg/dL   eGFR 67 >78 GN/FAO/1.30   BUN/Creatinine Ratio 11 10 - 24   Sodium 137  134 - 144 mmol/L   Potassium 4.4 3.5 - 5.2 mmol/L   Chloride 101 96 - 106 mmol/L   CO2 22 20 - 29 mmol/L   Calcium 9.3 8.6 - 10.2 mg/dL    Radiology US  CHEST (PLEURAL EFFUSION) Result Date: 07/02/2023 CLINICAL DATA:  Left-sided pleural effusion EXAM: CHEST ULTRASOUND COMPARISON:  Chest x-ray 06/24/2023 FINDINGS: Ultrasound evaluation of the left chest demonstrates a small pleural effusion. After consideration, patient would like to defer thoracentesis at this. IMPRESSION: Small pleural effusion visualized. Patient deferred thoracentesis at this time given the relatively small volume of fluid. Electronically Signed   By: Fernando Hoyer M.D.   On: 07/02/2023 12:33   DG Chest 2 View Result Date: 06/24/2023 CLINICAL DATA:  recent CABG, 05/28/2023 EXAM: CHEST - 2 VIEW COMPARISON:  June 02, 2023 FINDINGS: Mild cardiomegaly. Tortuous aorta. CABG markers and sternotomy wires. Low lung volumes with bronchovascular crowding. Biapical pleural thickening. Moderate volume left pleural effusion with adjacent atelectasis. No pneumothorax. IMPRESSION: Unchanged moderate volume left pleural effusion. Trace right pleural effusion. Electronically Signed   By: Rance Burrows M.D.   On: 06/24/2023 12:11    Assessment/Plan  CAD (coronary artery disease) Recent CABG and doing well.  Renal artery stenosis (HCC) His duplex shows a widely patent right renal artery status post intervention several years ago.  He has a congenitally absent left kidney.  Doing well from a renal vascular standpoint.  Blood pressure is good.  Follow-up in 1 year with duplex.  Mixed hyperlipidemia lipid control important in reducing the progression of atherosclerotic disease.   Essential hypertension Blood pressure control is improved.  This is important in reducing progression of atherosclerotic disease.  Mikki Alexander, MD  07/08/2023 10:15 AM    This note was created with Dragon medical transcription system.  Any errors from  dictation are purely unintentional

## 2023-07-08 NOTE — Progress Notes (Deleted)
      [address]       [place] [address]             [id number]       HPI: [name] is a 73 year old male with a past medical history of hypertension, hyperlipidemia, mild aortic insufficiency, left main and three-vessel coronary disease, right bundle branch block, exertional angina, single kidney, renal artery stenosis, renovascular hypertension, cervical disc disease, and arthritis. The patient returns for routine postoperative follow-up having undergone CABG x 4 and endarterectomy of the RCA by Dr. Luna Salinas on 05/28/23. The patient had a routine postoperative hospital stay.  The patient saw me in the clinic on 04/01 and he was noted to be progressing very well. He was ambulating well and denied shortness of breath. His weight had returned to preoperative weight and he had no edema. On CXR he was noted to have a moderate pleural effusion that had worsened since his hospital stay even though he received a 7 day course of Lasix at discharge. I sent him for a thoracentesis and this was amenable to thoracentesis on ultrasound per IR but the patient decided he no longer wanted the procedure done. Since he did not undergo thoracentesis I sent him 7 days of PO Lasix 40mg . Today he reports ***   Current Outpatient Medications  Medication Sig Dispense Refill   aspirin EC 81 MG tablet Take 1 tablet (81 mg total) by mouth daily. Swallow whole.     clopidogrel (PLAVIX) 75 MG tablet Take 1 tablet (75 mg total) by mouth daily. 30 tablet 2   Evolocumab (REPATHA SURECLICK) 140 MG/ML SOAJ Inject 140 mg into the skin every 14 (fourteen) days. 6 mL 3   furosemide (LASIX) 40 MG tablet Take 1 tablet (40 mg total) by mouth daily. 7 tablet 0   guaiFENesin (MUCINEX) 600 MG 12 hr tablet Take 1 tablet (600 mg total) by mouth 2 (two) times daily as needed.     losartan (COZAAR) 25 MG tablet Take 1 tablet (25 mg total) by mouth daily. 30 tablet 2   metoprolol succinate (TOPROL-XL) 100 MG 24 hr  tablet Take 1 tablet (100 mg total) by mouth daily. Take with or immediately following a meal. 30 tablet 1   potassium chloride SA (KLOR-CON M) 20 MEQ tablet Take 1 tablet (20 mEq total) by mouth daily. 7 tablet 0   No current facility-administered medications for this visit.   Vitals:  Physical Exam General Neuro CV Pulm GI Extremities Wound  Diagnostic Tests:   Impression/Plan: S/P CABG:   Randa [place], PA-C [facility] [phone]

## 2023-07-09 ENCOUNTER — Other Ambulatory Visit: Payer: Self-pay | Admitting: Thoracic Surgery (Cardiothoracic Vascular Surgery)

## 2023-07-09 DIAGNOSIS — Z951 Presence of aortocoronary bypass graft: Secondary | ICD-10-CM

## 2023-07-15 ENCOUNTER — Ambulatory Visit

## 2023-08-01 ENCOUNTER — Other Ambulatory Visit: Payer: Self-pay | Admitting: Internal Medicine

## 2023-08-01 ENCOUNTER — Telehealth: Payer: Self-pay

## 2023-08-01 MED ORDER — METOPROLOL SUCCINATE ER 100 MG PO TB24: 100.0000 mg | ORAL_TABLET | Freq: Every day | ORAL | 1 refills | Status: DC

## 2023-08-01 MED ORDER — CLOPIDOGREL BISULFATE 75 MG PO TABS: 75.0000 mg | ORAL_TABLET | Freq: Every day | ORAL | 3 refills | Status: DC

## 2023-08-01 NOTE — Telephone Encounter (Signed)
 Pt is requesting a refill on medications losartan , metoprolol  and potassium. Dr. Nolan Battle did not prescribe these medications. Would Dr. Nolan Battle like to refill these medications? Please address

## 2023-08-01 NOTE — Telephone Encounter (Signed)
 Copied from CRM [id number]. Topic: Clinical - Medication Question >> Aug 01, 2023  1:33 PM [name] wrote: Reason for CRM: Patient wants to know if Dr. Madelon Scheuermann will refill his metoprolol  succinate (TOPROL -XL) 100 MG 24 hr tablet medication since Frederick Pace's office has not been answering his calls and he's completely out of the medication. He would like it sent to the [facility] on file. Please call him with an update at

## 2023-08-01 NOTE — Addendum Note (Signed)
 Addended by: Thersia Flax on: 08/01/2023 09:28 PM   Modules accepted: Orders

## 2023-08-01 NOTE — Telephone Encounter (Signed)
*  STAT* If patient is at the pharmacy, call can be transferred to refill team.   1. Which medications need to be refilled? (please list name of each medication and dose if known)   clopidogrel  (PLAVIX ) 75 MG tablet   metoprolol  succinate (TOPROL -XL) 100 MG 24 hr tablet   losartan  (COZAAR ) 25 MG tablet    potassium chloride  SA (KLOR-CON  M) 20 MEQ tablet    4. Which pharmacy/location (including street and city if local pharmacy) is medication to be sent to?3   [facility] - [place], [place] - [address] Phone: [phone]  Fax: [phone]       5. Do they need a 30 day or 90 day supply? 90

## 2023-08-04 MED ORDER — LOSARTAN POTASSIUM 25 MG PO TABS: 25.0000 mg | ORAL_TABLET | Freq: Every day | ORAL | 2 refills | Status: DC

## 2023-08-04 MED ORDER — POTASSIUM CHLORIDE CRYS ER 20 MEQ PO TBCR: 20.0000 meq | EXTENDED_RELEASE_TABLET | Freq: Every day | ORAL | 0 refills | Status: DC

## 2023-08-04 NOTE — Telephone Encounter (Signed)
Pt is aware that medication has been refilled.  °

## 2023-08-15 ENCOUNTER — Ambulatory Visit (INDEPENDENT_AMBULATORY_CARE_PROVIDER_SITE_OTHER): Payer: Medicare Other | Admitting: *Deleted

## 2023-08-15 VITALS — Ht 64.0 in | Wt 183.0 lb

## 2023-08-15 DIAGNOSIS — Z0000 Encounter for general adult medical examination without abnormal findings: Secondary | ICD-10-CM

## 2023-08-15 DIAGNOSIS — Z1211 Encounter for screening for malignant neoplasm of colon: Secondary | ICD-10-CM

## 2023-08-15 NOTE — Patient Instructions (Signed)
 Frederick Pace , Thank you for taking time out of your busy schedule to complete your Annual Wellness Visit with me. I enjoyed our conversation and look forward to speaking with you again next year. I, as well as your care team,  appreciate your ongoing commitment to your health goals. Please review the following plan we discussed and let me know if I can assist you in the future. Your Game plan/ To Do List    Referrals: If you haven't heard from the office you've been referred to, please reach out to them at the phone provided.  Cologuard order has been placed. Consider updating your vaccines. Remember to call and schedule an eye exam. Follow up Visits: Next Medicare AWV with our clinical staff: 08/23/24 @ 10:50   Have you seen your provider in the last 6 months (3 months if uncontrolled diabetes)? Yes Next Office Visit with your provider: 10/06/23  Clinician Recommendations:  Aim for 30 minutes of exercise or brisk walking, 6-8 glasses of water, and 5 servings of fruits and vegetables each day.       This is a list of the screening recommended for you and due dates:  Health Maintenance  Topic Date Due   Pneumonia Vaccine (1 of 2 - PCV) Never done   Zoster (Shingles) Vaccine (1 of 2) Never done   COVID-19 Vaccine (4 - 2024-25 season) 11/24/2022   Cologuard (Stool DNA test)  05/22/2023   Flu Shot  10/24/2023   Medicare Annual Wellness Visit  08/14/2024   DTaP/Tdap/Td vaccine (2 - Td or Tdap) 12/13/2024   Hepatitis C Screening  Completed   HPV Vaccine  Aged Out   Meningitis B Vaccine  Aged Out   Colon Cancer Screening  Discontinued    Advanced directives: (Declined) Advance directive discussed with you today. Even though you declined this today, please call our office should you change your mind, and we can give you the proper paperwork for you to fill out. Advance Care Planning is important because it:  [x]  Makes sure you receive the medical care that is consistent with your values, goals,  and preferences  [x]  It provides guidance to your family and loved ones and reduces their decisional burden about whether or not they are making the right decisions based on your wishes.  Follow the link provided in your after visit summary or read over the paperwork we have mailed to you to help you started getting your Advance Directives in place. If you need assistance in completing these, please reach out to us  so that we can help you!

## 2023-08-15 NOTE — Progress Notes (Signed)
 Subjective:   Frederick Pace. is a 73 y.o. who presents for a Medicare Wellness preventive visit.  As a reminder, Annual Wellness Visits don't include a physical exam, and some assessments may be limited, especially if this visit is performed virtually. We may recommend an in-person follow-up visit with your provider if needed.  Visit Complete: Virtual I connected with  Frederick Pace. on 08/15/23 by a audio enabled telemedicine application and verified that I am speaking with the correct person using two identifiers.  Patient Location: Home  Provider Location: Home Office  I discussed the limitations of evaluation and management by telemedicine. The patient expressed understanding and agreed to proceed.  Vital Signs: Because this visit was a virtual/telehealth visit, some criteria may be missing or patient reported. Any vitals not documented were not able to be obtained and vitals that have been documented are patient reported.  VideoDeclined- This patient declined Librarian, academic. Therefore the visit was completed with audio only.  Persons Participating in Visit: Patient.  AWV Questionnaire: No: Patient Medicare AWV questionnaire was not completed prior to this visit.  Cardiac Risk Factors include: obesity (BMI >30kg/m2);advanced age (>77men, >73 women);male gender;dyslipidemia;hypertension     Objective:     Today's Vitals   08/15/23 1042  Weight: 183 lb (83 kg)  Height: 5\' 4"  (1.626 m)   Body mass index is 31.41 kg/m.     08/15/2023   10:59 AM 05/28/2023    7:02 AM 05/27/2023   10:15 AM 02/17/2023    7:46 AM 08/13/2022    3:03 PM 07/16/2021   12:48 PM 06/13/2020   11:27 AM  Advanced Directives  Does Patient Have a Medical Advance Directive? No No No No No No No  Would patient like information on creating a medical advance directive? No - Patient declined No - Patient declined No - Patient declined No - Patient declined No -  Patient declined No - Patient declined No - Patient declined    Current Medications (verified) Outpatient Encounter Medications as of 08/15/2023  Medication Sig   aspirin  EC 81 MG tablet Take 1 tablet (81 mg total) by mouth daily. Swallow whole.   clopidogrel  (PLAVIX ) 75 MG tablet Take 1 tablet (75 mg total) by mouth daily.   Evolocumab  (REPATHA  SURECLICK) 140 MG/ML SOAJ Inject 140 mg into the skin every 14 (fourteen) days.   guaiFENesin  (MUCINEX ) 600 MG 12 hr tablet Take 1 tablet (600 mg total) by mouth 2 (two) times daily as needed.   losartan  (COZAAR ) 25 MG tablet Take 1 tablet (25 mg total) by mouth daily.   metoprolol  succinate (TOPROL -XL) 100 MG 24 hr tablet Take 1 tablet (100 mg total) by mouth daily. Take with or immediately following a meal.   furosemide  (LASIX ) 40 MG tablet Take 1 tablet (40 mg total) by mouth daily. (Patient not taking: Reported on 08/15/2023)   potassium chloride  SA (KLOR-CON  M) 20 MEQ tablet Take 1 tablet (20 mEq total) by mouth daily. (Patient not taking: Reported on 08/15/2023)   No facility-administered encounter medications on file as of 08/15/2023.    Allergies (verified) Crestor  [rosuvastatin ] and Statins   History: Past Medical History:  Diagnosis Date   Aortic regurgitation    Congenital absence of one kidney    Coronary artery disease    HTN (hypertension)    Mitral regurgitation    Other and unspecified hyperlipidemia    Past Surgical History:  Procedure Laterality Date   ANTERIOR CRUCIATE LIGAMENT  REPAIR Left 2004   CORONARY ARTERY BYPASS GRAFT N/A 05/28/2023   Procedure: CORONARY ARTERY BYPASS GRAFTING TIMES FOUR USING LEFT INTERNAL MAMMARY ARTERY AND ENDOSCOPICALLY HARVESTED RIGHT GREAT SAPHENOUS VEIN; RIGHT CORONARY ENDARTERECTOMY;  Surgeon: Zelphia Higashi, MD;  Location: [facility] OR;  Service: Open Heart Surgery;  Laterality: N/A;   LEFT HEART CATH AND CORONARY ANGIOGRAPHY N/A 02/17/2023   Procedure: LEFT HEART CATH AND CORONARY  ANGIOGRAPHY;  Surgeon: Sammy Crisp, MD;  Location: [facility] INVASIVE CV LAB;  Service: Cardiovascular;  Laterality: N/A;   RENAL ANGIOGRAPHY Right 01/08/2018   Procedure: RENAL ANGIOGRAPHY;  Surgeon: Celso College, MD;  Location: [facility] INVASIVE CV LAB;  Service: Cardiovascular;  Laterality: Right;   TEE WITHOUT CARDIOVERSION N/A 05/28/2023   Procedure: ECHOCARDIOGRAM, TRANSESOPHAGEAL;  Surgeon: Zelphia Higashi, MD;  Location: [facility] OR;  Service: Open Heart Surgery;  Laterality: N/A;   Family History  Problem Relation Age of Onset   Dementia Mother    Heart disease Father 26       CABG   Mitral valve prolapse Father    Aortic aneurysm Father    Heart disease Paternal Uncle        Valve replacements   Hyperlipidemia Other        family Hx   Social History   Socioeconomic History   Marital status: Single    Spouse name: Not on file   Number of children: 0   Years of education: Not on file   Highest education level: Not on file  Occupational History   Not on file  Tobacco Use   Smoking status: Never   Smokeless tobacco: Never  Vaping Use   Vaping status: Never Used  Substance and Sexual Activity   Alcohol use: Yes    Comment: 1 drink and maybe 2 shots of bourbon every month   Drug use: No   Sexual activity: Not on file  Other Topics Concern   Not on file  Social History Narrative   Not on file   Social Drivers of Health   Financial Resource Strain: Low Risk  (08/15/2023)   Overall Financial Resource Strain (CARDIA)    Difficulty of Paying Living Expenses: Not hard at all  Food Insecurity: No Food Insecurity (08/15/2023)   Hunger Vital Sign    Worried About Running Out of Food in the Last Year: Never true    Ran Out of Food in the Last Year: Never true  Transportation Needs: No Transportation Needs (08/15/2023)   PRAPARE - Administrator, Civil Service (Medical): No    Lack of Transportation (Non-Medical): No  Physical Activity: Inactive (08/15/2023)    Exercise Vital Sign    Days of Exercise per Week: 0 days    Minutes of Exercise per Session: 0 min  Stress: No Stress Concern Present (08/15/2023)   [employer] of Occupational Health - Occupational Stress Questionnaire    Feeling of Stress : Only a little  Social Connections: Socially Isolated (08/15/2023)   Social Connection and Isolation Panel [NHANES]    Frequency of Communication with Friends and Family: More than three times a week    Frequency of Social Gatherings with Friends and Family: Twice a week    Attends Religious Services: Never    Database administrator or Organizations: No    Attends Banker Meetings: Never    Marital Status: Never married    Tobacco Counseling Counseling given: Not Answered    Clinical Intake:  Pre-visit preparation  completed: Yes  Pain : No/denies pain     BMI - recorded: 31.41 Nutritional Status: BMI > 30  Obese Nutritional Risks: None Diabetes: No  Lab Results  Component Value Date   HGBA1C 5.4 05/27/2023   HGBA1C 5.6 02/18/2023   HGBA1C 5.9 07/12/2021     How often do you need to have someone help you when you read instructions, pamphlets, or other written materials from your doctor or pharmacy?: 1 - Never  Interpreter Needed?: No  Information entered by :: R. Everest Brod LPN   Activities of Daily Living     08/15/2023   10:44 AM 05/29/2023    6:00 AM  In your present state of health, do you have any difficulty performing the following activities:  Hearing? 0 0  Vision? 0 0  Comment readers   Difficulty concentrating or making decisions? 0 0  Walking or climbing stairs? 0   Dressing or bathing? 0   Doing errands, shopping? 0 0  Preparing Food and eating ? N   Using the Toilet? N   In the past six months, have you accidently leaked urine? N   Do you have problems with loss of bowel control? N   Managing your Medications? N   Managing your Finances? N   Housekeeping or managing your Housekeeping? N      Patient Care Team: Thersia Flax, MD as PCP - General (Internal Medicine) End, Veryl Gottron, MD as PCP - Cardiology (Cardiology)  Indicate any recent Medical Services you may have received from other than [facility] providers in the past year (date may be approximate).     Assessment:    This is a routine wellness examination for [facility].  Hearing/Vision screen Hearing Screening - Comments:: No issues Vision Screening - Comments:: readers   Goals Addressed             This Visit's Progress    Patient Stated       Wants to get some people off of his back        Depression Screen     08/15/2023   10:55 AM 02/24/2023   11:03 AM 09/10/2022    3:42 PM 08/13/2022    3:01 PM 07/17/2021    1:22 PM 07/16/2021   12:40 PM 06/13/2020   11:26 AM  PHQ 2/9 Scores  PHQ - 2 Score 0 0 0 0 0 0 0  PHQ- 9 Score 0  0 0   0    Fall Risk     08/15/2023   10:48 AM 02/24/2023   11:03 AM 09/10/2022    3:42 PM 08/13/2022    3:03 PM 07/17/2021    1:22 PM  Fall Risk   Falls in the past year? 0 0 1 1 0  Number falls in past yr: 0 0 0 0   Injury with Fall? 0 0 0 0   Risk for fall due to : No Fall Risks No Fall Risks History of fall(s) History of fall(s) No Fall Risks  Follow up Falls evaluation completed;Falls prevention discussed Falls evaluation completed Falls evaluation completed Falls prevention discussed;Falls evaluation completed Falls evaluation completed    MEDICARE RISK AT HOME:  Medicare Risk at Home Any stairs in or around the home?: Yes If so, are there any without handrails?: Yes Home free of loose throw rugs in walkways, pet beds, electrical cords, etc?: Yes Adequate lighting in your home to reduce risk of falls?: Yes Life alert?: No Use of a cane, walker or  w/c?: No Grab bars in the bathroom?: No Shower chair or bench in shower?: No Elevated toilet seat or a handicapped toilet?: Yes  TIMED UP AND GO:  Was the test performed?  No  Cognitive Function: 6CIT completed     06/11/2019   11:19 AM  MMSE - Mini Mental State Exam  Not completed: Unable to complete        08/15/2023   11:00 AM 08/13/2022    3:07 PM 07/16/2021   12:49 PM 06/13/2020   11:28 AM 06/11/2019   11:20 AM  6CIT Screen  What Year? 0 points 0 points 0 points 0 points 0 points  What month? 0 points 0 points 0 points 0 points 0 points  What time? 0 points 0 points 0 points 0 points 0 points  Count back from 20 0 points 0 points  0 points   Months in reverse 0 points 2 points  0 points   Repeat phrase 2 points 2 points  0 points   Total Score 2 points 4 points  0 points     Immunizations Immunization History  Administered Date(s) Administered   Moderna Sars-Covid-2 Vaccination 06/18/2019, 07/16/2019, 02/15/2020   Tdap 12/14/2014    Screening Tests Health Maintenance  Topic Date Due   Pneumonia Vaccine 86+ Years old (1 of 2 - PCV) Never done   Zoster Vaccines- Shingrix (1 of 2) Never done   COVID-19 Vaccine (4 - 2024-25 season) 11/24/2022   Fecal DNA (Cologuard)  05/22/2023   INFLUENZA VACCINE  10/24/2023   Medicare Annual Wellness (AWV)  08/14/2024   DTaP/Tdap/Td (2 - Td or Tdap) 12/13/2024   Hepatitis C Screening  Completed   HPV VACCINES  Aged Out   Meningococcal B Vaccine  Aged Out   Colonoscopy  Discontinued    Health Maintenance  Health Maintenance Due  Topic Date Due   Pneumonia Vaccine 31+ Years old (1 of 2 - PCV) Never done   Zoster Vaccines- Shingrix (1 of 2) Never done   COVID-19 Vaccine (4 - 2024-25 season) 11/24/2022   Fecal DNA (Cologuard)  05/22/2023   Health Maintenance Items Addressed: Cologuard Ordered patient declines vaccines.   Additional Screening:  Vision Screening: Recommended annual ophthalmology exams for early detection of glaucoma and other disorders of the eye. Over due Patient was advised that he should call and schedule an eye exam.  Dental Screening: Recommended annual dental exams for proper oral hygiene  Community Resource Referral  / Chronic Care Management: CRR required this visit?  No   CCM required this visit?  No   Plan:    I have personally reviewed and noted the following in the patient's chart:   Medical and social history Use of alcohol, tobacco or illicit drugs  Current medications and supplements including opioid prescriptions. Patient is not currently taking opioid prescriptions. Functional ability and status Nutritional status Physical activity Advanced directives List of other physicians Hospitalizations, surgeries, and ER visits in previous 12 months Vitals Screenings to include cognitive, depression, and falls Referrals and appointments  In addition, I have reviewed and discussed with patient certain preventive protocols, quality metrics, and best practice recommendations. A written personalized care plan for preventive services as well as general preventive health recommendations were provided to patient.   Felicitas Horse, LPN   07/01/8117   After Visit Summary: (Declined) Due to this being a telephonic visit, with patients personalized plan was offered to patient but patient Declined AVS at this time   Notes: Nothing significant  to report at this time.

## 2023-09-22 ENCOUNTER — Encounter: Payer: Self-pay | Admitting: Medical

## 2023-09-22 ENCOUNTER — Ambulatory Visit: Attending: Medical | Admitting: Medical

## 2023-09-22 VITALS — BP 138/80 | Ht 64.0 in | Wt 189.6 lb

## 2023-09-22 DIAGNOSIS — I5032 Chronic diastolic (congestive) heart failure: Secondary | ICD-10-CM | POA: Diagnosis not present

## 2023-09-22 DIAGNOSIS — I2510 Atherosclerotic heart disease of native coronary artery without angina pectoris: Secondary | ICD-10-CM | POA: Diagnosis not present

## 2023-09-22 DIAGNOSIS — E782 Mixed hyperlipidemia: Secondary | ICD-10-CM

## 2023-09-22 DIAGNOSIS — J90 Pleural effusion, not elsewhere classified: Secondary | ICD-10-CM

## 2023-09-22 DIAGNOSIS — I10 Essential (primary) hypertension: Secondary | ICD-10-CM | POA: Diagnosis not present

## 2023-09-22 DIAGNOSIS — Z79899 Other long term (current) drug therapy: Secondary | ICD-10-CM

## 2023-09-22 MED ORDER — LOSARTAN POTASSIUM 50 MG PO TABS: 50.0000 mg | ORAL_TABLET | Freq: Every day | ORAL | 3 refills | Status: DC

## 2023-09-22 NOTE — Progress Notes (Addendum)
 Cardiology Office Note   Date:  09/22/2023  ID:  Frederick Pace., DOB February 16, 1951, MRN [id number] PCP: Frederick Verneita CROME, MD  [facility] Providers Cardiologist:  Frederick Hanson, MD   History of Present Illness Frederick Pace. is a 73 y.o. male with a history of CAD s/p CABG x 4, hypertension, hyperlipidemia, mild aortic insufficiency, right bundle branch block,  single kidney followed by [facility], renovascular hypertension, cervical disc disease, and arthritis who presents for 3 month follow-up for CAD.   Cardiac CT 11/2022 for shortness of breath with exertion showed coronary calcium  score showed score of 1066 which was 86 percentile.  Cardiac cath showed 80 to 90% distal left main stenosis with chronic total occlusion of his RCA.  Echo showed EF 60 to 65%, grade 1 diastolic dysfunction.  He was seen by cardiovascular surgery and set up for CABG.  He underwent CABG x 4 utilizing LIMA to LAD, SVG to distal LAD, SVG to RCA and SVG to OM as well as RCA endarterectomy and endoscopic right greater saphenous vein harvest.  Postop care was fairly uncomplicated.  He was discharged on postop day 5.  The patient was last seen 06/23/23 and was overall doing well from a cardiac perspective. Lasix  and potassium were stopped as he was euvolemic.  Today, the patient reports he stopped Plavix  for easy bruising. He is taking ASA in the morning. He is on repatha , losartan  and Toprol .  He denies chest pain or SOB. He ws noted to have small left pleural effusion but denied thoracentesis. He does not want to take lasix . He does not want to do cardiac rehab due to work.    Studies Reviewed      Intraoperative TEE 05/2023 Complications: No known complications during this procedure.  POST-OP IMPRESSIONS      s/p CABG x4  _ Left Ventricle: LVEF unchanged, CO > 4 L/min, CI > 2 L/min/m. No RWMA's  noted.  _ Right Ventricle: The right ventricle appears unchanged from pre-bypass.  _ Aorta: Unchanged. No  dissection noted after cannula removed.  _ Left Atrium: The left atrium appears unchanged from pre-bypass.  _ Left Atrial Appendage: The left atrial appendage appears unchanged from  pre-bypass.  _ Aortic Valve: The aortic valve appears unchanged from pre-bypass.  _ Mitral Valve: The mitral valve appears unchanged from pre-bypass.  _ Tricuspid Valve: The tricuspid valve appears unchanged from pre-bypass.  _ Pulmonic Valve: The pulmonic valve appears unchanged from pre-bypass.  _ Interatrial Septum: The interatrial septum appears unchanged from  pre-bypass.  _ Interventricular Septum: The interventricular septum appears unchanged  from  pre-bypass.  _ Pericardium: The pericardium appears unchanged from pre-bypass.      Echo 01/2023  1. Left ventricular ejection fraction, by estimation, is 60 to 65%. The  left ventricle has normal function. The left ventricle has no regional  wall motion abnormalities. There is mild concentric left ventricular  hypertrophy. Left ventricular diastolic  parameters are consistent with Grade I diastolic dysfunction (impaired  relaxation). The average left ventricular global longitudinal strain is  -16.3 %. The global longitudinal strain is abnormal.   2. Right ventricular systolic function is normal. The right ventricular  size is normal. There is normal pulmonary artery systolic pressure. The  estimated right ventricular systolic pressure is 23.1 mmHg.   3. The mitral valve is normal in structure. No evidence of mitral valve  regurgitation. No evidence of mitral stenosis.   4. The aortic valve is tricuspid. There  is moderate calcification of the  aortic valve. Aortic valve regurgitation is mild. Aortic valve  sclerosis/calcification is present, without any evidence of aortic  stenosis.   5. Aortic dilatation noted. There is mild dilatation of the aortic root,  measuring 41 mm.   6. The inferior vena cava is normal in size with greater than 50%   respiratory variability, suggesting right atrial pressure of 3 mmHg.    LHC 01/2023 Conclusions: Severe three-vessel coronary artery disease, including severe distal LMCA lesion involving ostia of LAD and LCx, as detailed below. Normal left ventricular systolic function and filling pressure.   Recommendations: Given severity of disease, admit for inpatient cardiac surgery consultation. Escalate rosuvastatin  to 40 mg daily; continue aggressive secondary prevention of coronary artery disease. Obtain echocardiogram.   Frederick Hanson, MD [facility]       Physical Exam VS:  BP 138/80 (BP Location: Left Arm, Patient Position: Sitting, Cuff Size: Normal)   Ht 5' 4 (1.626 m)   Wt 189 lb 9.6 oz (86 kg)   SpO2 96%   BMI 32.54 kg/m        Wt Readings from Last 3 Encounters:  09/22/23 189 lb 9.6 oz (86 kg)  08/15/23 183 lb (83 kg)  07/08/23 182 lb 3.2 oz (82.6 kg)    GEN: Well nourished, well developed in no acute distress NECK: No JVD; No carotid bruits CARDIAC: RRR, no murmurs, rubs, gallops RESPIRATORY:  Clear to auscultation without rales, wheezing or rhonchi  ABDOMEN: Soft, non-tender, non-distended EXTREMITIES:  No edema; No deformity   ASSESSMENT AND PLAN  CAD s/p CABG x4 Patient denies chest pain or SOB. He stopped Plavix  for bruising. He is taking ASA 81mg  daily. He does not want to do cardiac rehab due to the fact he is working. Continue ASA 81mg  daily, Repatha , Losartan , and Toprol .   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 I will update lipid panel. He would like to repeat Lp(a). Continue Repatha .   HTN BP is a little high. I will increase losartan  to 50mg  daily. Continue Toprol  100mg  daily.     Diastolic dysfunction Pleural effusion Most recent CXR showed small pleural effusion on the left. He is not wanting to take lasix  and refused thoracentesis.  Can re-check at follow-up.  Dispo: Follow-up in 3 months  Signed, Frederick Mofield VEAR Fishman, PA-C

## 2023-09-22 NOTE — Patient Instructions (Signed)
 Medication Instructions:  Your physician recommends the following medication changes.  INCREASE: Losartan  to 50 mg by mouth daily   *If you need a refill on your cardiac medications before your next appointment, please call your pharmacy*  Lab Work: Your provider would like for you to return  to have the following labs drawn: (Lipid, LPa).   Please go to [facility] [address] [address], [place] [address] You do not need an appointment.  They are open from 8 am- 4:30 pm.  Lunch from 1:00 pm- 2:00 pm You will need to be fasting.    Testing/Procedures: No test ordered today   Follow-Up: At [facility], you and your health needs are our priority.  As part of our continuing mission to provide you with exceptional heart care, our providers are all part of one team.  This team includes your primary Cardiologist (physician) and Advanced Practice Providers or APPs (Physician Assistants and Nurse Practitioners) who all work together to provide you with the care you need, when you need it.  Your next appointment:   3 month(s)  Provider:   You may see Lonni Hanson, MD or one of the following Advanced Practice Providers on your designated Care Team:   Lonni Meager, NP Lesley Maffucci, PA-C Bernardino Bring, PA-C Cadence Boston, PA-C Tylene Lunch, NP Barnie Hila, NP

## 2023-10-06 ENCOUNTER — Ambulatory Visit: Admitting: Internal Medicine

## 2023-10-06 DIAGNOSIS — Z79899 Other long term (current) drug therapy: Secondary | ICD-10-CM | POA: Diagnosis not present

## 2023-10-07 ENCOUNTER — Ambulatory Visit: Payer: Self-pay | Admitting: Medical

## 2023-10-07 LAB — LIPOPROTEIN A (LPA): Lipoprotein (a): 83.2 nmol/L — ABNORMAL HIGH (ref <75.0)

## 2023-10-07 LAB — LIPID PANEL
Chol/HDL Ratio: 2.5 ratio (ref 0.0–5.0)
Cholesterol, Total: 86 mg/dL — ABNORMAL LOW (ref 100–199)
HDL: 34 mg/dL — ABNORMAL LOW (ref >39)
LDL Chol Calc (NIH): 23 mg/dL (ref 0–99)
Triglycerides: 183 mg/dL — ABNORMAL HIGH (ref 0–149)
VLDL Cholesterol Cal: 29 mg/dL (ref 5–40)

## 2023-11-06 ENCOUNTER — Telehealth: Payer: Self-pay

## 2023-11-06 NOTE — Telephone Encounter (Signed)
 Called pt but does not have a vm set up to leave a message. Please scheduled pt appointment for physical when he calls back.

## 2023-11-19 ENCOUNTER — Other Ambulatory Visit: Payer: Self-pay | Admitting: Internal Medicine

## 2023-11-20 ENCOUNTER — Other Ambulatory Visit (HOSPITAL_COMMUNITY): Payer: Self-pay

## 2023-12-04 ENCOUNTER — Other Ambulatory Visit (HOSPITAL_COMMUNITY): Payer: Self-pay

## 2023-12-23 ENCOUNTER — Encounter: Payer: Self-pay | Admitting: Medical

## 2023-12-23 ENCOUNTER — Ambulatory Visit: Attending: Medical | Admitting: Medical

## 2023-12-23 VITALS — BP 140/80 | HR 54 | Ht 64.0 in | Wt 189.4 lb

## 2023-12-23 DIAGNOSIS — Z79899 Other long term (current) drug therapy: Secondary | ICD-10-CM

## 2023-12-23 DIAGNOSIS — I2510 Atherosclerotic heart disease of native coronary artery without angina pectoris: Secondary | ICD-10-CM | POA: Diagnosis not present

## 2023-12-23 DIAGNOSIS — I5032 Chronic diastolic (congestive) heart failure: Secondary | ICD-10-CM | POA: Diagnosis not present

## 2023-12-23 DIAGNOSIS — E782 Mixed hyperlipidemia: Secondary | ICD-10-CM | POA: Diagnosis not present

## 2023-12-23 DIAGNOSIS — I10 Essential (primary) hypertension: Secondary | ICD-10-CM

## 2023-12-23 DIAGNOSIS — J90 Pleural effusion, not elsewhere classified: Secondary | ICD-10-CM

## 2023-12-23 NOTE — Patient Instructions (Signed)
 Medication Instructions: Your physician recommends that you continue on your current medications as directed. Please refer to the Current Medication list given to you today.   No labs ordered today   *If you need a refill on your cardiac medications before your next appointment, please call your pharmacy*  Lab Work: No labs ordered today    Testing/Procedures: No test ordered today   Follow-Up: At [facility], you and your health needs are our priority.  As part of our continuing mission to provide you with exceptional heart care, our providers are all part of one team.  This team includes your primary Cardiologist (physician) and Advanced Practice Providers or APPs (Physician Assistants and Nurse Practitioners) who all work together to provide you with the care you need, when you need it.  Your next appointment:   4 month(s)  Provider:   Lonni Hanson, MD or Cadence Franchester, PA-C

## 2023-12-23 NOTE — Progress Notes (Signed)
 Cardiology Office Note   Date:  12/23/2023  ID:  Frederick Mousel., DOB [birth year], MRN [id number] PCP: Marylynn Verneita CROME, MD  [facility] Providers Cardiologist:  Lonni Hanson, MD  History of Present Illness Frederick Pace. is a 73 y.o. male with a history of CAD s/p CABG x 4, hypertension, hyperlipidemia, mild aortic insufficiency, right bundle branch block,  single kidney followed by [facility], renovascular hypertension, cervical disc disease, and arthritis who presents for 3 month follow-up for CAD.   Cardiac CT 11/2022 for shortness of breath with exertion showed coronary calcium  score showed score of 1066 which was 86 percentile.  Cardiac cath showed 80 to 90% distal left main stenosis with chronic total occlusion of his RCA.  Echo showed EF 60 to 65%, grade 1 diastolic dysfunction.  He was seen by cardiovascular surgery and set up for CABG.  He underwent CABG x 4 utilizing LIMA to LAD, SVG to distal LAD, SVG to RCA and SVG to OM as well as RCA endarterectomy and endoscopic right greater saphenous vein harvest.  Postop care was fairly uncomplicated.    Patient was last seen 09/22/2023 reporting he stopped Plavix  for easy bruising.  He was taking aspirin  in the morning.  Patient did not want to do cardiac rehab due to work.  Today, the patient has been OK. He denies chest pain, SOB, lower leg edema, palpitations. He mows the yard. Diet is Ok, could be better.    Studies Reviewed EKG Interpretation Date/Time:  Tuesday December 23 2023 11:13:44 EDT Ventricular Rate:  54 PR Interval:  160 QRS Duration:  138 QT Interval:  442 QTC Calculation: 419 R Axis:   -68  Text Interpretation: Sinus bradycardia Left axis deviation Right bundle branch block When compared with ECG of 23-Jun-2023 08:11, Vent. rate has decreased BY  37 BPM QT has shortened Confirmed by Franchester, Nachelle Negrette ([id number]) on 12/23/2023 11:24:29 AM     Intraoperative TEE 05/2023 Complications: No known complications during  this procedure.  POST-OP IMPRESSIONS      s/p CABG x4  _ Left Ventricle: LVEF unchanged, CO > 4 L/min, CI > 2 L/min/m. No RWMA's  noted.  _ Right Ventricle: The right ventricle appears unchanged from pre-bypass.  _ Aorta: Unchanged. No dissection noted after cannula removed.  _ Left Atrium: The left atrium appears unchanged from pre-bypass.  _ Left Atrial Appendage: The left atrial appendage appears unchanged from  pre-bypass.  _ Aortic Valve: The aortic valve appears unchanged from pre-bypass.  _ Mitral Valve: The mitral valve appears unchanged from pre-bypass.  _ Tricuspid Valve: The tricuspid valve appears unchanged from pre-bypass.  _ Pulmonic Valve: The pulmonic valve appears unchanged from pre-bypass.  _ Interatrial Septum: The interatrial septum appears unchanged from  pre-bypass.  _ Interventricular Septum: The interventricular septum appears unchanged  from  pre-bypass.  _ Pericardium: The pericardium appears unchanged from pre-bypass.      Echo 01/2023  1. Left ventricular ejection fraction, by estimation, is 60 to 65%. The  left ventricle has normal function. The left ventricle has no regional  wall motion abnormalities. There is mild concentric left ventricular  hypertrophy. Left ventricular diastolic  parameters are consistent with Grade I diastolic dysfunction (impaired  relaxation). The average left ventricular global longitudinal strain is  -16.3 %. The global longitudinal strain is abnormal.   2. Right ventricular systolic function is normal. The right ventricular  size is normal. There is normal pulmonary artery systolic pressure. The  estimated right  ventricular systolic pressure is 23.1 mmHg.   3. The mitral valve is normal in structure. No evidence of mitral valve  regurgitation. No evidence of mitral stenosis.   4. The aortic valve is tricuspid. There is moderate calcification of the  aortic valve. Aortic valve regurgitation is mild. Aortic valve   sclerosis/calcification is present, without any evidence of aortic  stenosis.   5. Aortic dilatation noted. There is mild dilatation of the aortic root,  measuring 41 mm.   6. The inferior vena cava is normal in size with greater than 50%  respiratory variability, suggesting right atrial pressure of 3 mmHg.    LHC 01/2023 Conclusions: Severe three-vessel coronary artery disease, including severe distal LMCA lesion involving ostia of LAD and LCx, as detailed below. Normal left ventricular systolic function and filling pressure.   Recommendations: Given severity of disease, admit for inpatient cardiac surgery consultation. Escalate rosuvastatin  to 40 mg daily; continue aggressive secondary prevention of coronary artery disease. Obtain echocardiogram.   Lonni Hanson, MD [facility]   Physical Exam VS:  BP (!) 140/80 (BP Location: Left Arm, Patient Position: Sitting)   Pulse (!) 54   Ht 5' 4 (1.626 m)   Wt 189 lb 6.4 oz (85.9 kg)   SpO2 97%   BMI 32.51 kg/m        Wt Readings from Last 3 Encounters:  12/23/23 189 lb 6.4 oz (85.9 kg)  09/22/23 189 lb 9.6 oz (86 kg)  08/15/23 183 lb (83 kg)    GEN: Well nourished, well developed in no acute distress NECK: No JVD; No carotid bruits CARDIAC: RRR, no murmurs, rubs, gallops RESPIRATORY:  Clear to auscultation without rales, wheezing or rhonchi  ABDOMEN: Soft, non-tender, non-distended EXTREMITIES:  No edema; No deformity   ASSESSMENT AND PLAN  CAD s/p CABG x 4  The patient denies chest pain or SOB. He did not want to do cardiac rehab. He mows the yard and walks pretty frequently. He stopped Plavix  for bruising. Continue ASA 81mg  daily, Repatha , and Toprol .   HLD LDL 23. 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 Continue Repatha .   HTN BP is mildly elevated. Discussed healthy lifestyle changes. Continue Losartan  100mg  daily and Toprol  100mg  daily.   Diastolic dysfunction Pleural effusion CTS ordered repeat CXR, but patient does not want this. He  is euvolemic on exam.        Dispo: Follow-up in 4 months  Signed, Jezabella Schriever VEAR Fishman, PA-C

## 2024-01-16 ENCOUNTER — Encounter: Payer: Self-pay | Admitting: Internal Medicine

## 2024-01-16 ENCOUNTER — Ambulatory Visit (INDEPENDENT_AMBULATORY_CARE_PROVIDER_SITE_OTHER): Admitting: Internal Medicine

## 2024-01-16 VITALS — BP 124/84 | HR 69 | Temp 98.2°F | Ht 64.0 in | Wt 191.2 lb

## 2024-01-16 DIAGNOSIS — I10 Essential (primary) hypertension: Secondary | ICD-10-CM | POA: Diagnosis not present

## 2024-01-16 DIAGNOSIS — T466X5A Adverse effect of antihyperlipidemic and antiarteriosclerotic drugs, initial encounter: Secondary | ICD-10-CM

## 2024-01-16 DIAGNOSIS — R5383 Other fatigue: Secondary | ICD-10-CM

## 2024-01-16 DIAGNOSIS — E785 Hyperlipidemia, unspecified: Secondary | ICD-10-CM | POA: Diagnosis not present

## 2024-01-16 DIAGNOSIS — Z0000 Encounter for general adult medical examination without abnormal findings: Secondary | ICD-10-CM

## 2024-01-16 DIAGNOSIS — G720 Drug-induced myopathy: Secondary | ICD-10-CM

## 2024-01-16 DIAGNOSIS — Z7185 Encounter for immunization safety counseling: Secondary | ICD-10-CM | POA: Diagnosis not present

## 2024-01-16 DIAGNOSIS — R7301 Impaired fasting glucose: Secondary | ICD-10-CM | POA: Diagnosis not present

## 2024-01-16 DIAGNOSIS — Z1211 Encounter for screening for malignant neoplasm of colon: Secondary | ICD-10-CM

## 2024-01-16 DIAGNOSIS — Z125 Encounter for screening for malignant neoplasm of prostate: Secondary | ICD-10-CM

## 2024-01-16 MED ORDER — METOPROLOL SUCCINATE ER 100 MG PO TB24: 100.0000 mg | ORAL_TABLET | Freq: Every day | ORAL | 1 refills | Status: AC

## 2024-01-16 MED ORDER — ASPIRIN 81 MG PO TBEC: 81.0000 mg | DELAYED_RELEASE_TABLET | Freq: Every day | ORAL | Status: AC

## 2024-01-16 NOTE — Progress Notes (Signed)
 Patient ID: Frederick Gillison., male    DOB: 12/16/1950  Age: 73 y.o. MRN: [id number]  The patient is here for annual preventive examination and management of other chronic and acute problems.   The risk factors are reflected in the social history.   The roster of all physicians providing medical care to patient - is listed in the Snapshot section of the chart.   Activities of daily living:  The patient is 100% independent in all ADLs: dressing, toileting, feeding as well as independent mobility   Home safety : The patient has smoke detectors in the home. They wear seatbelts.  There are no unsecured firearms at home. There is no violence in the home.    There is no risks for hepatitis, STDs or HIV. There is no   history of blood transfusion. They have no travel history to infectious disease endemic areas of the world.   The patient has seen their dentist in the last six month. They have seen their eye doctor in the last year. The patinet  denies slight hearing difficulty with regard to whispered voices and some television programs.  They have deferred audiologic testing in the last year.  They do not  have excessive sun exposure. Discussed the need for sun protection: hats, long sleeves and use of sunscreen if there is significant sun exposure.    Diet: the importance of a healthy diet is discussed. They do not have a particular healthy diet. Lunch is at a diner. . Dinner is mediterranean style (lebanese food)    The benefits of regular aerobic exercise were discussed. The patient  is physically active but doe not exercise.     Depression screen: there are no signs or vegative symptoms of depression- irritability, change in appetite, anhedonia, sadness/tearfullness.   The following portions of the patient's history were reviewed and updated as appropriate: allergies, current medications, past family history, past medical history,  past surgical history, past social history  and problem list.    Visual acuity was not assessed per patient preference since the patient has regular follow up with an  ophthalmologist. Hearing and body mass index were assessed and reviewed.    During the course of the visit the patient was educated and counseled about appropriate screening and preventive services including : fall prevention , diabetes screening, nutrition counseling, colorectal cancer screening, and recommended immunizations.    Chief Complaint:   Hypertension: patient checks blood pressure twice weekly at home.  Readings have been for the most part <130/80 at rest . Patient is following a reduced salt diet most days and is taking medicatiosince ns as prescribed (losartan  and toprol )  HLD:  taking Repatha  due to statin myalgia history   CAD: ; no chest pain during activity using a push mower .  Stopped the plavix    Colon CA screening:  has not  completed the cologuard   Declines pneumonia vaccine  and flu vaccine    Review of Symptoms  Patient denies headache, fevers, malaise, unintentional weight loss, skin rash, eye pain, sinus congestion and sinus pain, sore throat, dysphagia,  hemoptysis , cough, dyspnea, wheezing, chest pain, palpitations, orthopnea, edema, abdominal pain, nausea, melena, diarrhea, constipation, flank pain, dysuria, hematuria, urinary  Frequency, nocturia, numbness, tingling, seizures,  Focal weakness, Loss of consciousness,  Tremor, insomnia, depression, anxiety, and suicidal ideation.    Physical Exam:  BP 124/84 (Cuff Size: Normal)   Pulse 69   Temp 98.2 F (36.8 C) (Oral)   Ht  5' 4 (1.626 m)   Wt 191 lb 3.2 oz (86.7 kg)   SpO2 98%   BMI 32.82 kg/m    Physical Exam Vitals reviewed.  Constitutional:      General: He is not in acute distress.    Appearance: Normal appearance. He is normal weight. He is not ill-appearing, toxic-appearing or diaphoretic.  HENT:     Head: Normocephalic and atraumatic.     Right Ear: Tympanic membrane, ear canal and  external ear normal. There is no impacted cerumen.     Left Ear: Tympanic membrane, ear canal and external ear normal. There is no impacted cerumen.     Nose: Nose normal.     Mouth/Throat:     Mouth: Mucous membranes are moist.     Pharynx: Oropharynx is clear.  Eyes:     General: No scleral icterus.       Right eye: No discharge.        Left eye: No discharge.     Conjunctiva/sclera: Conjunctivae normal.  Neck:     Thyroid : No thyromegaly.     Vascular: No carotid bruit or JVD.  Cardiovascular:     Rate and Rhythm: Normal rate and regular rhythm.     Heart sounds: Normal heart sounds.  Pulmonary:     Effort: Pulmonary effort is normal. No respiratory distress.     Breath sounds: Normal breath sounds.  Abdominal:     General: Bowel sounds are normal.     Palpations: Abdomen is soft. There is no mass.     Tenderness: There is no abdominal tenderness. There is no guarding or rebound.  Musculoskeletal:        General: Normal range of motion.     Cervical back: Normal range of motion and neck supple.  Lymphadenopathy:     Cervical: No cervical adenopathy.  Skin:    General: Skin is warm and dry.  Neurological:     General: No focal deficit present.     Mental Status: He is alert and oriented to person, place, and time. Mental status is at baseline.  Psychiatric:        Mood and Affect: Mood normal.        Behavior: Behavior normal.        Thought Content: Thought content normal.        Judgment: Judgment normal.       Assessment and Plan: Colon cancer screening  Essential hypertension -     Comprehensive metabolic panel with GFR; Future -     Microalbumin / creatinine urine ratio; Future  Prostate cancer screening -     PSA; Future  Hyperlipidemia LDL goal <70 -     Lipid panel; Future -     LDL cholesterol, direct; Future  Visit for preventive health examination Assessment & Plan: age appropriate education and counseling updated, referrals for preventative  services and immunizations addressed, dietary and smoking counseling addressed, most recent labs reviewed.  I have personally reviewed and have noted:   1) the patient's medical and social history 2) The pt's use of alcohol, tobacco, and illicit drugs 3) The patient's current medications and supplements 4) Functional ability including ADL's, fall risk, home safety risk, hearing and visual impairment 5) Diet and physical activities 6) Evidence for depression or mood disorder 7) The patient's height, weight, and BMI have been recorded in the chart    I have made referrals, and provided counseling and education based on review of the above  Impaired fasting glucose -     Comprehensive metabolic panel with GFR; Future -     Hemoglobin A1c; Future  Other fatigue -     TSH; Future -     CBC with Differential/Platelet; Future  Vaccine counseling Assessment & Plan: He declines Zostavax , Pneumonia and Influenza vaccines,  But has received the COVID 19 vaccines  And is up to date on TdaP   White coat syndrome with hypertension Assessment & Plan: Home readings have been done and are < 130/80 on current regimen    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 Take with or immediately following a meal.  Dispense: 90 tablet; Refill: 1 -     Aspirin ; Take 1 tablet (81 mg total) by mouth daily. Swallow whole.    No follow-ups on file.  Verneita LITTIE Kettering, MD

## 2024-01-16 NOTE — Assessment & Plan Note (Signed)

## 2024-01-16 NOTE — Assessment & Plan Note (Signed)
 Home readings have been done and are < 130/80 on current regimen

## 2024-01-16 NOTE — Patient Instructions (Signed)
 Please return for labs next week.  As long as you are taking Repatha ,  you do not need to fast more than 4 hours.   Please complete the Cologuard test. This screening test for colon cancer is very accurate in patients who are at average risk for colon cancer, if  repeated  every 3 years.  You should get a vision test and eye exam annually

## 2024-01-16 NOTE — Assessment & Plan Note (Signed)
He declines Zostavax , Pneumonia and Influenza vaccines,  But has received the COVID 19 vaccines  And is up to date on TdaP

## 2024-01-22 ENCOUNTER — Other Ambulatory Visit

## 2024-03-19 ENCOUNTER — Other Ambulatory Visit: Payer: Self-pay | Admitting: Internal Medicine

## 2024-04-27 ENCOUNTER — Encounter: Payer: Self-pay | Admitting: Medical

## 2024-04-27 ENCOUNTER — Ambulatory Visit: Admitting: Medical

## 2024-04-27 VITALS — BP 104/60 | HR 68 | Ht 64.0 in | Wt 191.4 lb

## 2024-04-27 DIAGNOSIS — E782 Mixed hyperlipidemia: Secondary | ICD-10-CM | POA: Diagnosis not present

## 2024-04-27 DIAGNOSIS — I10 Essential (primary) hypertension: Secondary | ICD-10-CM

## 2024-04-27 DIAGNOSIS — I2510 Atherosclerotic heart disease of native coronary artery without angina pectoris: Secondary | ICD-10-CM | POA: Diagnosis not present

## 2024-04-27 DIAGNOSIS — I5032 Chronic diastolic (congestive) heart failure: Secondary | ICD-10-CM | POA: Diagnosis not present

## 2024-04-27 DIAGNOSIS — Z951 Presence of aortocoronary bypass graft: Secondary | ICD-10-CM | POA: Diagnosis not present

## 2024-04-27 NOTE — Patient Instructions (Signed)
 Medication Instructions:  Your physician recommends that you continue on your current medications as directed. Please refer to the Current Medication list given to you today.    *If you need a refill on your cardiac medications before your next appointment, please call your pharmacy*  Lab Work: No labs ordered today    Testing/Procedures: No test ordered today   Follow-Up: At [facility], you and your health needs are our priority.  As part of our continuing mission to provide you with exceptional heart care, our providers are all part of one team.  This team includes your primary Cardiologist (physician) and Advanced Practice Providers or APPs (Physician Assistants and Nurse Practitioners) who all work together to provide you with the care you need, when you need it.  Your next appointment:   6 month(s)  Provider:   Lonni Hanson, MD or Cadence Franchester, PA-C

## 2024-04-30 LAB — COLOGUARD: COLOGUARD: NEGATIVE

## 2024-07-06 ENCOUNTER — Ambulatory Visit (INDEPENDENT_AMBULATORY_CARE_PROVIDER_SITE_OTHER): Admitting: Vascular Surgery

## 2024-07-06 ENCOUNTER — Encounter (INDEPENDENT_AMBULATORY_CARE_PROVIDER_SITE_OTHER)

## 2024-08-23 ENCOUNTER — Ambulatory Visit
